# Patient Record
Sex: Female | Born: 1970 | Race: White | Hispanic: No | Marital: Married | Smoking: Former smoker
Health system: Southern US, Community
[De-identification: ages and names within clinical notes are randomized; demographics above are authoritative.]

## PROBLEM LIST (undated history)

## (undated) DIAGNOSIS — K645 Perianal venous thrombosis: Secondary | ICD-10-CM

## (undated) DIAGNOSIS — Z87442 Personal history of urinary calculi: Secondary | ICD-10-CM

## (undated) DIAGNOSIS — K602 Anal fissure, unspecified: Secondary | ICD-10-CM

## (undated) DIAGNOSIS — Z973 Presence of spectacles and contact lenses: Secondary | ICD-10-CM

## (undated) DIAGNOSIS — E669 Obesity, unspecified: Secondary | ICD-10-CM

## (undated) DIAGNOSIS — B009 Herpesviral infection, unspecified: Secondary | ICD-10-CM

## (undated) DIAGNOSIS — S060X9A Concussion with loss of consciousness of unspecified duration, initial encounter: Secondary | ICD-10-CM

## (undated) DIAGNOSIS — R5383 Other fatigue: Secondary | ICD-10-CM

## (undated) DIAGNOSIS — S060XAA Concussion with loss of consciousness status unknown, initial encounter: Secondary | ICD-10-CM

## (undated) HISTORY — PX: ABDOMINAL HYSTERECTOMY: SHX81

## (undated) HISTORY — PX: KIDNEY STONE SURGERY: SHX686

## (undated) HISTORY — DX: Perianal venous thrombosis: K64.5

## (undated) HISTORY — DX: Obesity, unspecified: E66.9

## (undated) HISTORY — PX: MULTIPLE TOOTH EXTRACTIONS: SHX2053

## (undated) HISTORY — PX: DILATION AND CURETTAGE OF UTERUS: SHX78

## (undated) HISTORY — DX: Anal fissure, unspecified: K60.2

## (undated) HISTORY — DX: Other fatigue: R53.83

---

## 2002-04-07 ENCOUNTER — Emergency Department (HOSPITAL_COMMUNITY): Admission: EM | Admit: 2002-04-07 | Discharge: 2002-04-08 | Payer: Self-pay | Admitting: Emergency Medicine

## 2002-12-07 ENCOUNTER — Emergency Department (HOSPITAL_COMMUNITY): Admission: EM | Admit: 2002-12-07 | Discharge: 2002-12-07 | Payer: Self-pay | Admitting: Emergency Medicine

## 2005-11-26 ENCOUNTER — Other Ambulatory Visit: Admission: RE | Admit: 2005-11-26 | Discharge: 2005-11-26 | Payer: Self-pay | Admitting: Obstetrics and Gynecology

## 2005-12-03 ENCOUNTER — Emergency Department (HOSPITAL_COMMUNITY): Admission: EM | Admit: 2005-12-03 | Discharge: 2005-12-03 | Payer: Self-pay | Admitting: Emergency Medicine

## 2006-03-24 ENCOUNTER — Emergency Department (HOSPITAL_COMMUNITY): Admission: EM | Admit: 2006-03-24 | Discharge: 2006-03-24 | Payer: Self-pay | Admitting: Emergency Medicine

## 2007-01-23 ENCOUNTER — Emergency Department (HOSPITAL_COMMUNITY): Admission: EM | Admit: 2007-01-23 | Discharge: 2007-01-24 | Payer: Self-pay | Admitting: Emergency Medicine

## 2007-07-24 ENCOUNTER — Ambulatory Visit (HOSPITAL_COMMUNITY): Admission: RE | Admit: 2007-07-24 | Discharge: 2007-07-24 | Payer: Self-pay | Admitting: Urology

## 2009-08-08 ENCOUNTER — Encounter: Admission: RE | Admit: 2009-08-08 | Discharge: 2009-08-08 | Payer: Self-pay | Admitting: Obstetrics and Gynecology

## 2011-01-13 ENCOUNTER — Encounter: Payer: Self-pay | Admitting: Cardiology

## 2011-01-14 ENCOUNTER — Encounter: Payer: Self-pay | Admitting: Obstetrics and Gynecology

## 2011-05-07 NOTE — Op Note (Signed)
NAMEKIMBERLIN, SCHEEL              ACCOUNT NO.:  [id number]   MEDICAL RECORD NO.:  [id number]          PATIENT TYPE:  AMB   LOCATION:  DAY                          FACILITY:  [facility]   PHYSICIAN:  Mark C. Vernie Ammons, M.D.  DATE OF BIRTH:  May 11, 1971   DATE OF PROCEDURE:  07/24/2007  DATE OF DISCHARGE:                               OPERATIVE REPORT   PREOPERATIVE DIAGNOSIS:  Right ureteral calculi.   POSTOPERATIVE DIAGNOSIS:  Right ureteral calculi.   PROCEDURE:  Cystoscopy, right retrograde pyelogram with interpretation,  right ureteroscopy with stone extraction and double-J stent placement.   SURGEON:  Mark C. Vernie Ammons, M.D.   ANESTHESIA:  General.   SPECIMENS:  Stone given to the patient.   BLOOD LOSS:  Minimal.   DRAINS:  A 5-French 24-cm Polaris stent in the right ureter with string.   COMPLICATIONS:  None.   INDICATIONS:  The patient is a 40 year old white female with  intermittent right flank pain.  Her CT scan revealed a stone in what  appeared to be distal ureter; this was followed conservatively and did  not progress.  She also appeared to have a calcification in the upper  ureteral region that was felt to possibly be a second stone; this also  failed to progress, so she is brought to the OR today for ureteroscopic  extraction of the stones.  The risks, complications and alternatives  were discussed; the patient understands and elects to proceed.   DESCRIPTION OF OPERATION:  After informed consent, the patient was  brought to the major OR and placed on the table and administered general  anesthesia then.  She was then moved to the dorsal lithotomy position  and her genitalia were sterilely prepped and draped.  Initially, a 6-  French rigid ureteroscope was passed per urethra into the bladder.  The  bladder was inspected and noted be free of any tumors or stones, or  inflammatory lesions.  The right orifice was identified and the  ureteroscope was advanced in the right  orifice, gently and easily passed  into the orifice and distal ureteral region.   A right retrograde pyelogram was then performed using full-strength  iodinated contrast through the ureteroscope.  Under direct fluoroscopy,  the contrast was injected and a filling defect was noted in the distal  ureter, consistent with the distal stone.  I therefore passed a 0.03-  inch floppy-tip guidewire through the ureteroscope and up into the renal  pelvis under direct fluoroscopic control, left that in position and then  removed the ureteroscope.   The cystoscope was back-loaded over the guidewire and the 4-cm length 14-  French ureteral dilating balloon was then passed over the guidewire and  placed in the area near the intramural ureter and inflated to 12  atmospheres for approximately 60 seconds; it was then deflated and  removed.  The guidewire was left in place and the cystoscope removed as  well.   The ureteroscope was reinserted and passed beside the guidewire up the  ureter and the distal ureteral stone was then identified, grasped with a  nitinol basket and extracted  without difficulty.  I then reinserted the  rigid ureteroscope and passed this on up the ureter and noted no further  stones in the ureter.  Since there appeared to be an upper ureteral  stone, I felt further investigation was indicated and therefore removed  the rigid ureteroscope and over the guidewire, passed the 6-French  flexible ureteroscope; the guidewire was then removed, once the scope  was in the area of the renal pelvis under fluoroscopy.  I then inspected  each calix systematically.  I was able to identify on the floor of the  renal pelvis; this was grasped with nitinol basket and extracted along  with the ureteroscope.   The 22-French cystoscope was then inserted back into the bladder and a  0.038-inche floppy-tip guidewire was then passed into the right ureteral  orifice and up the right ureter under direct  fluoroscopy.  The Polaris  stent was then passed over the guidewire and the guidewire was removed  with good curl being noted in the renal pelvis.  I then removed the  guidewire and then drained the bladder.  The cystoscope was removed and  the string affixed to the distal aspect of the stent was then attached  to the inner thigh and the patient was awakened and taken to the  recovery room in stable satisfactory condition.  She tolerated the  procedure well.  There were no intraoperative complications.  She will  follow up with me in the office in 5 days for stent removal.  In the  meantime, I will give her a prescription for Vicodin HP, #28, and  Pyridium Plus, #30.      Mark C. Vernie Ammons, M.D.  Electronically Signed     MCO/MEDQ  D:  07/24/2007  T:  07/25/2007  Job:  [id number]

## 2011-08-07 ENCOUNTER — Ambulatory Visit (INDEPENDENT_AMBULATORY_CARE_PROVIDER_SITE_OTHER): Payer: BC Managed Care – PPO | Admitting: Surgery

## 2011-08-07 ENCOUNTER — Encounter (INDEPENDENT_AMBULATORY_CARE_PROVIDER_SITE_OTHER): Payer: Self-pay | Admitting: Surgery

## 2011-08-07 DIAGNOSIS — K645 Perianal venous thrombosis: Secondary | ICD-10-CM

## 2011-08-07 DIAGNOSIS — K602 Anal fissure, unspecified: Secondary | ICD-10-CM | POA: Insufficient documentation

## 2011-08-07 DIAGNOSIS — K648 Other hemorrhoids: Secondary | ICD-10-CM | POA: Insufficient documentation

## 2011-08-07 HISTORY — DX: Perianal venous thrombosis: K64.5

## 2011-08-07 MED ORDER — HYDROCORTISONE ACETATE 25 MG RE SUPP: 25.0000 mg | Freq: Two times a day (BID) | RECTAL | Status: AC

## 2011-08-07 NOTE — Patient Instructions (Signed)
Use external cream for 3 weeks.  Use suppositories for 10 days.

## 2011-08-07 NOTE — Progress Notes (Signed)
Visit Diagnoses: 1. External hemorrhoid, thrombosed   2. Anal fissure   3. Internal hemorrhoids     HISTORY: Patient is a 40 year old female who has previously been evaluated in our practice for anal rectal problems. She was last seen here 2 years ago. Over the past few weeks she has experienced discomfort and swelling. She has not seen any bleeding. Symptoms began after an episode of heavy lifting. Patient presents today for evaluation.   PERTINENT REVIEW OF SYSTEMS: No bleeding. Moderate swelling. Moderate discomfort. No drainage.   EXAM: HEENT: normocephalic; pupils equal and reactive; sclerae clear; dentition good; mucous membranes moist NECK:  No thyroid nodules; symmetric on extension; no palpable anterior or posterior cervical lymphadenopathy; no supraclavicular masses; no tenderness CHEST: clear to auscultation bilaterally without rales, rhonchi, or wheezes CARDIAC: regular rate and rhythm without significant murmur; peripheral pulses are full EXT:  non-tender without edema; no deformity NEURO: no gross focal deficits; no sign of tremor Anorectal:  The multiple external hemorrhoidal skin tags circumferentially. Moderate soft tissue swelling left anal verge. On palpation there is a thrombosed external hemorrhoid measuring less than 1 cm in size. It is mildly tender. With eversion   of the anoderm there is a superficial fissure in the posterior midline area and with digital exam there is moderate edema of the anal canal. There are no palpable masses. There is no sign of bleeding.  IMPRESSION: #1-small thrombosed external hemorrhoid, minimally symptomatic #2-superficial posterior anal fissure #3-grade 1 internal hemorrhoids   PLAN: Patient is provided with written literature regarding anorectal problems. I am starting her on Cardizem cream for the next 3 weeks for management of the superficial anal fissure. I think this will heal with medical management. I have also given her a  prescription for Anusol-HC suppositories to use for the next 10 days. This should improve her internal hemorrhoids. The thrombosed external hemorrhoid is small and superficial and minimally symptomatic and should resolve spontaneously.  Patient will return in 3 weeks to assess her progress. I think it is unlikely that she will require any surgical intervention.   Velora Heckler, MD, FACS General & Endocrine Surgery [facility]

## 2011-09-04 ENCOUNTER — Encounter (INDEPENDENT_AMBULATORY_CARE_PROVIDER_SITE_OTHER): Payer: BC Managed Care – PPO | Admitting: Surgery

## 2011-09-19 ENCOUNTER — Encounter (INDEPENDENT_AMBULATORY_CARE_PROVIDER_SITE_OTHER): Payer: Self-pay | Admitting: Surgery

## 2011-10-07 LAB — HEMOGLOBIN AND HEMATOCRIT, BLOOD
HCT: 36.6
Hemoglobin: 12.5

## 2011-10-07 LAB — PREGNANCY, URINE: Preg Test, Ur: NEGATIVE

## 2011-11-05 ENCOUNTER — Other Ambulatory Visit: Payer: Self-pay | Admitting: Obstetrics and Gynecology

## 2011-11-05 DIAGNOSIS — N6313 Unspecified lump in the right breast, lower outer quadrant: Secondary | ICD-10-CM

## 2011-11-19 ENCOUNTER — Ambulatory Visit
Admission: RE | Admit: 2011-11-19 | Discharge: 2011-11-19 | Disposition: A | Discharge status: Home / Self Care | Payer: BC Managed Care – PPO | Source: Ambulatory Visit | Attending: Obstetrics and Gynecology | Admitting: Obstetrics and Gynecology

## 2011-11-19 DIAGNOSIS — N6313 Unspecified lump in the right breast, lower outer quadrant: Secondary | ICD-10-CM

## 2013-07-29 ENCOUNTER — Ambulatory Visit (INDEPENDENT_AMBULATORY_CARE_PROVIDER_SITE_OTHER): Payer: BC Managed Care – PPO | Admitting: General Surgery

## 2013-08-09 ENCOUNTER — Encounter (INDEPENDENT_AMBULATORY_CARE_PROVIDER_SITE_OTHER): Payer: Self-pay | Admitting: Surgery

## 2013-08-09 ENCOUNTER — Ambulatory Visit (INDEPENDENT_AMBULATORY_CARE_PROVIDER_SITE_OTHER): Payer: PRIVATE HEALTH INSURANCE | Admitting: Surgery

## 2013-08-09 VITALS — BP 132/76 | HR 68 | Resp 14 | Ht 64.0 in | Wt 236.6 lb

## 2013-08-09 DIAGNOSIS — K648 Other hemorrhoids: Secondary | ICD-10-CM

## 2013-08-09 DIAGNOSIS — K602 Anal fissure, unspecified: Secondary | ICD-10-CM

## 2013-08-09 MED ORDER — AMBULATORY NON FORMULARY MEDICATION: 1.0000 "application " | Freq: Four times a day (QID) | Status: DC

## 2013-08-09 NOTE — Progress Notes (Signed)
Subjective:     Patient ID: Ann Watson, female   DOB: May 08, 1971, 42 y.o.   MRN: [id number]  HPI  REIKA CALLANAN  10-25-1971 [id number]  Patient Care Team: Catha Gosselin, MD as PCP - General (Family Medicine)  This patient is a 42 y.o.female who presents today for surgical evaluation at the request of self.   Reason for visit: Anal pain, probable hemorrhoids  Pleasant woman that is struggled with rectal pain for many years.  She is seen her group numerous times.  She has been offered surgery for hemorrhoids such had declined with flares proving.  She was seen two years ago with a fissure and hemorrhoids.  She notes the diltiazem cream help that out.  Her hemorrhoids that have been under pretty good control until two months ago when she felt rather sharp pain.  She is not having any major bleeding.  She occasionally notes some prolapsing and bulging.  It is hard to keep the region clear.  Usually has one bowel movement a day.  She tries to have vegetables and her diet but does not take a fiber supplement.  She recalls getting banding once 15 years ago but no other interventions.  Her mother and maternal grandfather both had to have hemorrhoid surgery in their 30s.  She has never had a colonoscopy.  No personal nor family history of GI/colon cancer, inflammatory bowel disease, irritable bowel syndrome, allergy such as Celiac Sprue, dietary/dairy problems, colitis, ulcers nor gastritis.  No recent sick contacts/gastroenteritis.  No travel outside the country.  No changes in diet.    Patient Active Problem List   Diagnosis Date Noted  . Anal fissure 08/07/2011  . Internal hemorrhoids with prolapse/pain 08/07/2011    Past Medical History  Diagnosis Date  . Chronic kidney disease     kidney stone extraction  . Fatigue   . Hemorrhoids   . External hemorrhoid, thrombosed 08/07/2011    Past Surgical History  Procedure Laterality Date  . Kidney stone surgery      History   Social  History  . Marital Status: Married    Spouse Name: N/A    Number of Children: N/A  . Years of Education: N/A   Occupational History  . Not on file.   Social History Main Topics  . Smoking status: Former Smoker    Quit date: 08/10/2007  . Smokeless tobacco: Never Used  . Alcohol Use: Yes     Comment: wine - weekly  . Drug Use: No  . Sexual Activity: Not on file   Other Topics Concern  . Not on file   Social History Narrative  . No narrative on file    Family History  Problem Relation Age of Onset  . Diabetes Mother     Current Outpatient Prescriptions  Medication Sig Dispense Refill  . AMBULATORY NON FORMULARY MEDICATION Place 1 application rectally 4 (four) times daily. Diltiazem 2% compounded suspension.  1 Tube  2   No current facility-administered medications for this visit.     No Known Allergies  BP 132/76  Pulse 68  Resp 14  Ht 5\' 4"  (1.626 m)  Wt 236 lb 9.6 oz (107.321 kg)  BMI 40.59 kg/m2  No results found.   Review of Systems  Constitutional: Negative for fever, chills, diaphoresis, appetite change and fatigue.  HENT: Negative for ear pain, sore throat, trouble swallowing, neck pain and ear discharge.   Eyes: Negative for photophobia, discharge and visual disturbance.  Respiratory: Negative  for cough, choking, chest tightness and shortness of breath.   Cardiovascular: Negative for chest pain and palpitations.  Gastrointestinal: Positive for rectal pain. Negative for nausea, vomiting, abdominal pain, diarrhea, constipation, blood in stool, abdominal distention and anal bleeding.  Endocrine: Negative for cold intolerance and heat intolerance.  Genitourinary: Negative for dysuria, frequency and difficulty urinating.  Musculoskeletal: Negative for myalgias and gait problem.  Skin: Negative for color change, pallor and rash.  Allergic/Immunologic: Negative for environmental allergies, food allergies and immunocompromised state.  Neurological: Negative  for dizziness, speech difficulty, weakness and numbness.  Hematological: Negative for adenopathy.  Psychiatric/Behavioral: Negative for confusion and agitation. The patient is not nervous/anxious.        Objective:   Physical Exam  Constitutional: She is oriented to person, place, and time. She appears well-developed and well-nourished. No distress.  HENT:  Head: Normocephalic.  Mouth/Throat: Oropharynx is clear and moist. No oropharyngeal exudate.  Eyes: Conjunctivae and EOM are normal. Pupils are equal, round, and reactive to light. No scleral icterus.  Neck: Normal range of motion. Neck supple. No tracheal deviation present.  Cardiovascular: Normal rate, regular rhythm and intact distal pulses.   Pulmonary/Chest: Effort normal and breath sounds normal. No stridor. No respiratory distress. She exhibits no tenderness.  Abdominal: Soft. She exhibits no distension and no mass. There is no tenderness. Hernia confirmed negative in the right inguinal area and confirmed negative in the left inguinal area.  Genitourinary: No vaginal discharge found.  Exam done with assistance of female Medical Assistant in the room.  Perianal skin clean with good hygiene.  No pruritis.  Mildly stretched/redundant anoderm but no distinct skin tags.   No pilonidal disease.  No abscess/fistula.    Small but sensitive posterior midline anal fissure.  Swollen internal hemorrhoids.  Mildly prolapsing with Valsalva.  Right anterior equals left lateral greater than right posterior.  Tolerates digital anoscopic rectal exam.  Slightly increased sphincter tone.  No rectal masses.     Musculoskeletal: Normal range of motion. She exhibits no tenderness.       Right elbow: She exhibits normal range of motion.       Left elbow: She exhibits normal range of motion.       Right wrist: She exhibits normal range of motion.       Left wrist: She exhibits normal range of motion.       Right hand: Normal strength noted.       Left  hand: Normal strength noted.  Lymphadenopathy:       Head (right side): No posterior auricular adenopathy present.       Head (left side): No posterior auricular adenopathy present.    She has no cervical adenopathy.    She has no axillary adenopathy.       Right: No inguinal adenopathy present.       Left: No inguinal adenopathy present.  Neurological: She is alert and oriented to person, place, and time. No cranial nerve deficit. She exhibits normal muscle tone. Coordination normal.  Skin: Skin is warm and dry. No rash noted. She is not diaphoretic. No erythema.  Psychiatric: She has a normal mood and affect. Her behavior is normal. Judgment and thought content normal.       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  Enlarged internal hemorrhoids.     Plan:     We will try diltiazem cream first.  If not improved, examination under anesthesia and lateral  internal sphincterotomy to treat fissure:  The anatomy & physiology of the anorectal region was discussed.  The pathophysiology of anal fissure and differential diagnosis was discussed.  Natural history progression  was discussed.   I stressed the importance of a bowel regimen to have daily soft bowel movements to minimize progression of disease.   I discussed the use of warm soaks &  muscle relaxant, diltiazem, to help the anal sphincter relax, allow the spasming to stop, and help the tear/fissure to heal.  If non-operative treatment does not heal the fissure, I would recommend examination under anesthesia for better examination to confirm the diagnosis and treat by lateral internal sphincterotomy to allow the fissure to heal.  Technique, benefits, alternatives discussed.  Risks such as bleeding, pain, incontinence, recurrence, heart attack, death, and other risks were discussed.    Educational handouts further explaining the pathology, treatment options, and bowel regimen were given as well.  The  patient expressed understanding & will follow up PRN.  If the pain does not resolve in a few weeks or worsens, she should proceed with surgery.  I strongly recommend a bowel regimen to help the hemorrhoids come down.  I suspect she will need at least banding if not possible THD.  However, until the fissure heals, I would table with a hemorrhoid evaluation and work on bowel regimen first:  The anatomy & physiology of the anorectal region was discussed.  The pathophysiology of hemorrhoids and differential diagnosis was discussed.  Natural history progression  was discussed.   I stressed the importance of a bowel regimen to have daily soft bowel movements to minimize progression of disease.   Goal of one BM / day ideal.  Use of wet wipes, warm baths, avoiding straining, etc were emphasized.  Educational handouts further explaining the pathology, treatment options, and bowel regimen were given as well.   The patient expressed understanding.

## 2013-08-09 NOTE — Patient Instructions (Addendum)
Anal Fissure, Adult An anal fissure is a small tear or crack in the skin around the anus. Bleeding from a fissure usually stops on its own within a few minutes. However, bleeding will often reoccur with each bowel movement until the crack heals.  CAUSES   Passing large, hard stools.  Frequent diarrheal stools.  Constipation.  Inflammatory bowel disease (Crohn's disease or ulcerative colitis).  Infections.  Anal sex. SYMPTOMS   Small amounts of blood seen on your stools, on toilet paper, or in the toilet after a bowel movement.  Rectal bleeding.  Painful bowel movements.  Itching or irritation around the anus. DIAGNOSIS Your caregiver will examine the anal area. An anal fissure can usually be seen with careful inspection. A rectal exam may be performed and a short tube (anoscope) may be used to examine the anal canal. TREATMENT   You may be instructed to take fiber supplements. These supplements can soften your stool to help make bowel movements easier.  Sitz baths may be recommended to help heal the tear. Do not use soap in the sitz baths.  A medicated cream or ointment may be prescribed to lessen discomfort. HOME CARE INSTRUCTIONS   Maintain a diet high in fruits, whole grains, and vegetables. Avoid constipating foods like bananas and dairy products.  Take sitz baths as directed by your caregiver.  Drink enough fluids to keep your urine clear or pale yellow.  Only take over-the-counter or prescription medicines for pain, discomfort, or fever as directed by your caregiver. Do not take aspirin as this may increase bleeding.  Do not use ointments containing numbing medications (anesthetics) or hydrocortisone. They could slow healing.  Use of diltiazem muscle relaxant cream to help fissure heal.  This works 75% of the time.  If it does not work, you would benefit from surgery. SEEK MEDICAL CARE IF:   Your fissure is not completely healed within 3 days.  You have  further bleeding.  You have a fever.  You have diarrhea mixed with blood.  You have pain.  Your problem is getting worse rather than better. MAKE SURE YOU:   Understand these instructions.  Will watch your condition.  Will get help right away if you are not doing well or get worse. Document Released: 12/09/2005 Document Revised: 03/02/2012 Document Reviewed: 05/26/2011 [facility] Patient Information 2014 [place], [place].   HEMORRHOIDS  The rectum is the last foot of your colon, and it naturally stretches to hold stool.  Hemorrhoidal piles are natural clusters of blood vessels that help the rectum and anal canal stretch to hold stool and allow bowel movements to eliminate feces.   Hemorrhoids are abnormally swollen blood vessels in the rectum.  Too much pressure in the rectum causes hemorrhoids by forcing blood to stretch and bulge the walls of the veins, sometimes even rupturing them.  Hemorrhoids can become like varicose veins you might see on a person's legs.  Most people will develop a flare of hemorrhoids in their lifetime.  When bulging hemorrhoidal veins are irritated, they can swell, burn, itch, cause pain, and bleed.  Most flares will calm down gradually own within a few weeks.  However, once hemorrhoids are created, they are difficult to get rid of completely and tend to flare more easily than the first flare.   Fortunately, good habits and simple medical treatment usually control hemorrhoids well, and surgery is needed only in severe cases. Types of Hemorrhoids:  Internal hemorrhoids usually don't initially hurt or itch; they are deep inside  the rectum and usually have no sensation. If they begin to push out (prolapse), pain and burning can occur.  However, internal hemorrhoids can bleed.  Anal bleeding should not be ignored since bleeding could come from a dangerous source like colorectal cancer, so persistent rectal bleeding should be investigated by a doctor, sometimes with a  colonoscopy.  External hemorrhoids cause most of the symptoms - pain, burning, and itching. Nonirritated hemorrhoids can look like small skin tags coming out of the anus.   Thrombosed hemorrhoids can form when a hemorrhoid blood vessel bursts and causes the hemorrhoid to suddenly swell.  A purple blood clot can form in it and become an excruciatingly painful lump at the anus. Because of these unpleasant symptoms, immediate incision and drainage by a surgeon at an office visit can provide much relief of the pain.    PREVENTION Avoiding the most frequent causes listed below will prevent most cases of hemorrhoids: Constipation Hard stools Diarrhea  Constant sitting  Straining with bowel movements Sitting on the toilet for a long time  Severe coughing  episodes Pregnancy / Childbirth  Heavy Lifting  Sometimes avoiding the above triggers is difficult:  How can you avoid sitting all day if you have a seated job? Also, we try to avoid coughing and diarrhea, but sometimes it's beyond your control.  Still, there are some practical hints to help: Keep the anal and genital area clean.  Moistened tissues such as flushable wet wipes are less irritating than toilet paper.  Using irrigating showers or bottle irrigation washing gently cleans this sensitive area.   Avoid dry toilet paper when cleaning after bowel movements.  Marland Kitchen Keep the anal and genital area dry.  Lightly pat the rectal area dry.  Avoid rubbing.  Talcum or baby powders can help GET YOUR STOOLS SOFT.   This is the most important way to prevent irritated hemorrhoids.  Hard stools are like sandpaper to the anorectal canal and will cause more problems.  The goal: ONE SOFT BOWEL MOVEMENT A DAY!  BMs from every other day to 3 times a day is a tolerable range Treat coughing, diarrhea and constipation early since irritated hemorrhoids may soon follow.  If your main job activity is seated, always stand or walk during your breaks. Make it a point to stand  and walk at least 5 minutes every hour and try to shift frequently in your chair to avoid direct rectal pressure.  Always exhale as you strain or lift. Don't hold your breath.  Do not delay or try to prevent a bowel movement when the urge is present. Exercise regularly (walking or jogging 60 minutes a day) to stimulate the bowels to move. No reading or other activity while on the toilet. If bowel movements take longer than 5 minutes, you are too constipated. AVOID CONSTIPATION Drink plenty of liquids (1 1/2 to 2 quarts of water and other fluids a day unless fluid restricted for another medical condition). Liquids that contain caffeine (coffee a, tea, soft drinks) can be dehydrating and should be avoided until constipation is controlled. Consider minimizing milk, as dairy products may be constipating. Eat plenty of fiber (30g a day ideal, more if needed).  Fiber is the undigested part of plant food that passes into the colon, acting as "natures broom" to encourage bowel motility and movement.  Fiber can absorb and hold large amounts of water. This results in a larger, bulkier stool, which is soft and easier to pass.  Eating foods high in  fiber - 12 servings - such as  Vegetables: Root (potatoes, carrots, turnips), Leafy green (lettuce, salad greens, celery, spinach), High residue (cabbage, broccoli, etc.) Fruit: Fresh, Dried (prunes, apricots, cherries), Stewed (applesauce)  Whole grain breads, pasta, whole wheat Bran cereals, muffins, etc. Consider adding supplemental bulking fiber which retains large volumes of water: Psyllium ground seeds --available as Metamucil, Konsyl, Effersyllium, Per Diem Fiber, or the less expensive generic forms.  Citrucel  (methylcellulose wood fiber) . FiberCon (Polycarbophil) Polyethylene Glycol - and "artificial" fiber commonly called Miralax or Glycolax.  It is helpful for people with gassy or bloated feelings with regular fiber Flax Seed - a less gassy natural fiber   Laxatives can be useful for a short period if constipation is severe Osmotics (Milk of Magnesia, Fleets Phospho-Soda, Magnesium Citrate)  Stimulants (Senokot,   Castor Oil,  Dulcolax, Ex-Lax)    Laxatives are not a good long-term solution as it can stress the bowels and cause too much mineral loss and dehydration.   Avoid taking laxatives for more than 7 days in a row.  AVOID DIARRHEA Switch to liquids and simpler foods for a few days to avoid stressing your intestines further. Avoid dairy products (especially milk & ice cream) for a short time.  The intestines often can lose the ability to digest lactose when stressed. Avoid foods that cause gassiness or bloating.  Typical foods include beans and other legumes, cabbage, broccoli, and dairy foods.  Every person has some sensitivity to other foods, so listen to your body and avoid those foods that trigger problems for you. Adding fiber (Citrucel, Metamucil, FiberCon, Flax seed, Miralax) gradually can help thicken stools by absorbing excess fluid and retrain the intestines to act more normally.  Slowly increase the dose over a few weeks.  Too much fiber too soon can backfire and cause cramping & bloating. Probiotics (such as active yogurt, Align, etc) may help repopulate the intestines and colon with normal bacteria and calm down a sensitive digestive tract.  Most studies show it to be of mild help, though, and such products can be costly. Medicines: Bismuth subsalicylate (ex. Kayopectate, Pepto Bismol) every 30 minutes for up to 6 doses can help control diarrhea.  Avoid if pregnant. Loperamide (Immodium) can slow down diarrhea.  Start with two tablets (4mg  total) first and then try one tablet every 6 hours.  Avoid if you are having fevers or severe pain.  If you are not better or start feeling worse, stop all medicines and call your doctor for advice Call your doctor if you are getting worse or not better.  Sometimes further testing (cultures,  endoscopy, X-ray studies, bloodwork, etc) may be needed to help diagnose and treat the cause of the diarrhea. TREATMENT OF HEMORRHOID FLARE If these preventive measures fail, you must take action right away! Hemorrhoids are one condition that can be mild in the morning and become intolerable by nightfall. Most hemorrhoidal flares take several weeks to calm down.  These suggestions can help: Warm soaks.  This helps more than any topical medication.  Use up to 8 times a day.  Usually sitz baths or sitting in a warm bathtub helps.  Sitting on moist warm towels are helpful.  Switching to ice packs/cool compresses can be helpful Normalize your bowels.  Extremes of diarrhea or constipation will make hemorrhoids worse.  One soft bowel movement a day is the goal.  Fiber can help get your bowels regular Wet wipes instead of toilet paper Pain control with a NSAID  such as ibuprofen (Advil) or naproxen (Aleve) or acetaminophen (Tylenol) around the clock.  Narcotics are constipating and should be minimized if possible Topical creams contain steroids (bydrocortisone) or local anesthetic (xylocaine) can help make pain and itching more tolerable.   EVALUATION If hemorrhoids are still causing problems, you could benefit by an evaluation by a surgeon.  The surgeon will obtain a history and examine you.  If hemorrhoids are diagnosed, some therapies can be offered in the office, usually with an anoscope into the less sensitive area of the rectum: -injection of hemorrhoids (sclerotherapy) can scar the blood vessels of the swollen/enlarged hemorrhoids to help shrink them down to a more normal size -rubber banding of the enlarged hemorrhoids to help shrink them down to a more normal size -drainage of the blood clot causing a thrombosed hemorrhoid,  to relieve the severe pain   While 90% of the time such problems from hemorrhoids can be managed without preceding to surgery, sometimes the hemorrhoids require a operation to  control the problem (uncontrolled bleeding, prolapse, pain, etc.).   This involves being placed under general anesthesia where the surgeon can confirm the diagnosis and remove, suture, or staple the hemorrhoid(s).  Your surgeon can help you treat the problem appropriately.

## 2013-09-07 ENCOUNTER — Ambulatory Visit (INDEPENDENT_AMBULATORY_CARE_PROVIDER_SITE_OTHER): Payer: PRIVATE HEALTH INSURANCE | Admitting: Surgery

## 2013-09-07 ENCOUNTER — Encounter (INDEPENDENT_AMBULATORY_CARE_PROVIDER_SITE_OTHER): Payer: Self-pay | Admitting: Surgery

## 2013-09-07 VITALS — BP 128/82 | HR 62 | Resp 18 | Ht 64.0 in | Wt 232.0 lb

## 2013-09-07 DIAGNOSIS — K648 Other hemorrhoids: Secondary | ICD-10-CM

## 2013-09-07 DIAGNOSIS — K602 Anal fissure, unspecified: Secondary | ICD-10-CM

## 2013-09-07 NOTE — Progress Notes (Signed)
Subjective:     Patient ID: Ann Watson, female   DOB: 1971/05/25, 42 y.o.   MRN: [id number]  HPI   [name]  02-09-1971 [id number]  Patient Care Team: Catha Gosselin, MD as PCP - General (Family Medicine)  This patient is a 42 y.o.female who presents today for surgical evaluation at the request of self.   Reason for visit: Anal pain, probable hemorrhoids  Pleasant woman that is struggled with rectal pain for many years.  She is seen her group numerous times.  She has been offered surgery for hemorrhoids such had declined with flares proving.  She was seen two years ago with a fissure and hemorrhoids.  She notes the diltiazem cream help that out.   She had another flare of pain again.  I diagnosed a fissure on top of some inflamed hemorrhoids.  We talked about trying to find a bowel regimen she could be compliant with.  I recommended to her thighs and muscle relaxant cream.  Since then, she found something that worked for her.  Diltiazem cream and bowel regimen or helping the pain go down.  In a much better place.  She is happy how she is improving  Patient Active Problem List   Diagnosis Date Noted  . Anal fissure 08/07/2011  . Internal hemorrhoids with prolapse/pain 08/07/2011    Past Medical History  Diagnosis Date  . Chronic kidney disease     kidney stone extraction  . Fatigue   . Hemorrhoids   . External hemorrhoid, thrombosed 08/07/2011  . Obese     Past Surgical History  Procedure Laterality Date  . Kidney stone surgery      History   Social History  . Marital Status: Married    Spouse Name: N/A    Number of Children: N/A  . Years of Education: N/A   Occupational History  . Not on file.   Social History Main Topics  . Smoking status: Former Smoker    Quit date: 08/10/2007  . Smokeless tobacco: Never Used  . Alcohol Use: Yes     Comment: wine - weekly  . Drug Use: No  . Sexual Activity: Not on file   Other Topics Concern  . Not on file    Social History Narrative  . No narrative on file    Family History  Problem Relation Age of Onset  . Diabetes Mother     Current Outpatient Prescriptions  Medication Sig Dispense Refill  . AMBULATORY NON FORMULARY MEDICATION Place 1 application rectally 4 (four) times daily. Diltiazem 2% compounded suspension.  1 Tube  2   No current facility-administered medications for this visit.     No Known Allergies  BP 128/82  Pulse 62  Resp 18  Ht 5\' 4"  (1.626 m)  Wt 232 lb (105.235 kg)  BMI 39.8 kg/m2  No results found.   Review of Systems  Constitutional: Negative for fever, chills, diaphoresis, appetite change and fatigue.  HENT: Negative for ear pain, sore throat, trouble swallowing, neck pain and ear discharge.   Eyes: Negative for photophobia, discharge and visual disturbance.  Respiratory: Negative for cough, choking, chest tightness and shortness of breath.   Cardiovascular: Negative for chest pain and palpitations.  Gastrointestinal: Negative for nausea, vomiting, abdominal pain, diarrhea, constipation, blood in stool, abdominal distention and anal bleeding.  Endocrine: Negative for cold intolerance and heat intolerance.  Genitourinary: Negative for dysuria, frequency and difficulty urinating.  Musculoskeletal: Negative for myalgias and gait problem.  Skin:  Negative for color change, pallor and rash.  Allergic/Immunologic: Negative for environmental allergies, food allergies and immunocompromised state.  Neurological: Negative for dizziness, speech difficulty, weakness and numbness.  Hematological: Negative for adenopathy.  Psychiatric/Behavioral: Negative for confusion and agitation. The patient is not nervous/anxious.        Objective:   Physical Exam  Constitutional: She is oriented to person, place, and time. She appears well-developed and well-nourished. No distress.  HENT:  Head: Normocephalic.  Mouth/Throat: Oropharynx is clear and moist. No oropharyngeal  exudate.  Eyes: Conjunctivae and EOM are normal. Pupils are equal, round, and reactive to light. No scleral icterus.  Neck: Normal range of motion. Neck supple. No tracheal deviation present.  Cardiovascular: Normal rate, regular rhythm and intact distal pulses.   Pulmonary/Chest: Effort normal and breath sounds normal. No stridor. No respiratory distress. She exhibits no tenderness.  Abdominal: Soft. She exhibits no distension and no mass. There is no tenderness. Hernia confirmed negative in the right inguinal area and confirmed negative in the left inguinal area.  Genitourinary: No vaginal discharge found.  Exam done with assistance of female Medical Assistant in the room.  Perianal skin clean with good hygiene.  No pruritis.  Mildly stretched/redundant anoderm but no distinct skin tags.   No pilonidal disease.  No abscess/fistula.    Posterior midline anal fissure nearly closed, Small little posterior sentinel tag.  Some external skin but no prolapsing hemorrhoids today.    Musculoskeletal: Normal range of motion. She exhibits no tenderness.       Right elbow: She exhibits normal range of motion.       Left elbow: She exhibits normal range of motion.       Right wrist: She exhibits normal range of motion.       Left wrist: She exhibits normal range of motion.       Right hand: Normal strength noted.       Left hand: Normal strength noted.  Lymphadenopathy:       Head (right side): No posterior auricular adenopathy present.       Head (left side): No posterior auricular adenopathy present.    She has no cervical adenopathy.    She has no axillary adenopathy.       Right: No inguinal adenopathy present.       Left: No inguinal adenopathy present.  Neurological: She is alert and oriented to person, place, and time. No cranial nerve deficit. She exhibits normal muscle tone. Coordination normal.  Skin: Skin is warm and dry. No rash noted. She is not diaphoretic. No erythema.  Psychiatric:  She has a normal mood and affect. Her behavior is normal. Judgment and thought content normal.       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  Enlarged internal hemorrhoids.     Plan:     Continue the diltiazem cream for the fissure as it seems to be healing.  If not improved, examination under anesthesia and lateral internal sphincterotomy to treat fissure:  The anatomy & physiology of the anorectal region was discussed.  The pathophysiology of anal fissure and differential diagnosis was discussed.  Natural history progression  was discussed.   I stressed the importance of a bowel regimen to have daily soft bowel movements to minimize progression of disease.   I discussed the use of warm soaks &  muscle relaxant, diltiazem, to help the anal sphincter relax, allow the spasming to stop, and help the  tear/fissure to heal.  If non-operative treatment does not heal the fissure, I would recommend examination under anesthesia for better examination to confirm the diagnosis and treat by lateral internal sphincterotomy to allow the fissure to heal.  Technique, benefits, alternatives discussed.  Risks such as bleeding, pain, incontinence, recurrence, heart attack, death, and other risks were discussed.    Educational handouts further explaining the pathology, treatment options, and bowel regimen were given as well.  The patient expressed understanding & will follow up PRN.  If the pain does not resolve in a few weeks or worsens, she should proceed with surgery.  She is a believer in the bowel regimen to help the hemorrhoids come down.   She is improved enough that she may not need anything more aggressive such as banding or THD.  At this point she can followup when necessary as long as she continues to improve.  The anatomy & physiology of the anorectal region was discussed.  The pathophysiology of hemorrhoids and differential diagnosis was discussed.  Natural  history progression  was discussed.   I stressed the importance of a bowel regimen to have daily soft bowel movements to minimize progression of disease.   Goal of one BM / day ideal.  Use of wet wipes, warm baths, avoiding straining, etc were emphasized.  Educational handouts further explaining the pathology, treatment options, and bowel regimen were given as well.   The patient expressed understanding.

## 2013-09-07 NOTE — Patient Instructions (Signed)
HEMORRHOIDS  The rectum is the last foot of your colon, and it naturally stretches to hold stool.  Hemorrhoidal piles are natural clusters of blood vessels that help the rectum and anal canal stretch to hold stool and allow bowel movements to eliminate feces.   Hemorrhoids are abnormally swollen blood vessels in the rectum.  Too much pressure in the rectum causes hemorrhoids by forcing blood to stretch and bulge the walls of the veins, sometimes even rupturing them.  Hemorrhoids can become like varicose veins you might see on a person's legs.  Most people will develop a flare of hemorrhoids in their lifetime.  When bulging hemorrhoidal veins are irritated, they can swell, burn, itch, cause pain, and bleed.  Most flares will calm down gradually own within a few weeks.  However, once hemorrhoids are created, they are difficult to get rid of completely and tend to flare more easily than the first flare.   Fortunately, good habits and simple medical treatment usually control hemorrhoids well, and surgery is needed only in severe cases. Types of Hemorrhoids:  Internal hemorrhoids usually don't initially hurt or itch; they are deep inside the rectum and usually have no sensation. If they begin to push out (prolapse), pain and burning can occur.  However, internal hemorrhoids can bleed.  Anal bleeding should not be ignored since bleeding could come from a dangerous source like colorectal cancer, so persistent rectal bleeding should be investigated by a doctor, sometimes with a colonoscopy.  External hemorrhoids cause most of the symptoms - pain, burning, and itching. Nonirritated hemorrhoids can look like small skin tags coming out of the anus.   Thrombosed hemorrhoids can form when a hemorrhoid blood vessel bursts and causes the hemorrhoid to suddenly swell.  A purple blood clot can form in it and become an excruciatingly painful lump at the anus. Because of these unpleasant symptoms, immediate incision and  drainage by a surgeon at an office visit can provide much relief of the pain.    PREVENTION Avoiding the most frequent causes listed below will prevent most cases of hemorrhoids: Constipation Hard stools Diarrhea  Constant sitting  Straining with bowel movements Sitting on the toilet for a long time  Severe coughing  episodes Pregnancy / Childbirth  Heavy Lifting  Sometimes avoiding the above triggers is difficult:  How can you avoid sitting all day if you have a seated job? Also, we try to avoid coughing and diarrhea, but sometimes it's beyond your control.  Still, there are some practical hints to help: Keep the anal and genital area clean.  Moistened tissues such as flushable wet wipes are less irritating than toilet paper.  Using irrigating showers or bottle irrigation washing gently cleans this sensitive area.   Avoid dry toilet paper when cleaning after bowel movements.  Marland Kitchen Keep the anal and genital area dry.  Lightly pat the rectal area dry.  Avoid rubbing.  Talcum or baby powders can help GET YOUR STOOLS SOFT.   This is the most important way to prevent irritated hemorrhoids.  Hard stools are like sandpaper to the anorectal canal and will cause more problems.  The goal: ONE SOFT BOWEL MOVEMENT A DAY!  BMs from every other day to 3 times a day is a tolerable range Treat coughing, diarrhea and constipation early since irritated hemorrhoids may soon follow.  If your main job activity is seated, always stand or walk during your breaks. Make it a point to stand and walk at least 5 minutes every hour  and try to shift frequently in your chair to avoid direct rectal pressure.  Always exhale as you strain or lift. Don't hold your breath.  Do not delay or try to prevent a bowel movement when the urge is present. Exercise regularly (walking or jogging 60 minutes a day) to stimulate the bowels to move. No reading or other activity while on the toilet. If bowel movements take longer than 5 minutes,  you are too constipated. AVOID CONSTIPATION Drink plenty of liquids (1 1/2 to 2 quarts of water and other fluids a day unless fluid restricted for another medical condition). Liquids that contain caffeine (coffee a, tea, soft drinks) can be dehydrating and should be avoided until constipation is controlled. Consider minimizing milk, as dairy products may be constipating. Eat plenty of fiber (30g a day ideal, more if needed).  Fiber is the undigested part of plant food that passes into the colon, acting as "natures broom" to encourage bowel motility and movement.  Fiber can absorb and hold large amounts of water. This results in a larger, bulkier stool, which is soft and easier to pass.  Eating foods high in fiber - 12 servings - such as  Vegetables: Root (potatoes, carrots, turnips), Leafy green (lettuce, salad greens, celery, spinach), High residue (cabbage, broccoli, etc.) Fruit: Fresh, Dried (prunes, apricots, cherries), Stewed (applesauce)  Whole grain breads, pasta, whole wheat Bran cereals, muffins, etc. Consider adding supplemental bulking fiber which retains large volumes of water: Psyllium ground seeds (native plant from [place])--available as Metamucil, Konsyl, Effersyllium, Per Diem Fiber, or the less expensive generic forms.  Citrucel  (methylcellulose wood fiber) . FiberCon (Polycarbophil) Polyethylene Glycol - and "artificial" fiber commonly called Miralax or Glycolax.  It is helpful for people with gassy or bloated feelings with regular fiber Flax Seed - a less gassy natural fiber  Laxatives can be useful for a short period if constipation is severe Osmotics (Milk of Magnesia, Fleets Phospho-Soda, Magnesium Citrate)  Stimulants (Senokot,   Castor Oil,  Dulcolax, Ex-Lax)    Laxatives are not a good long-term solution as it can stress the bowels and cause too much mineral loss and dehydration.   Avoid taking laxatives for more than 7 days in a row.  AVOID DIARRHEA Switch to  liquids and simpler foods for a few days to avoid stressing your intestines further. Avoid dairy products (especially milk & ice cream) for a short time.  The intestines often can lose the ability to digest lactose when stressed. Avoid foods that cause gassiness or bloating.  Typical foods include beans and other legumes, cabbage, broccoli, and dairy foods.  Every person has some sensitivity to other foods, so listen to your body and avoid those foods that trigger problems for you. Adding fiber (Citrucel, Metamucil, FiberCon, Flax seed, Miralax) gradually can help thicken stools by absorbing excess fluid and retrain the intestines to act more normally.  Slowly increase the dose over a few weeks.  Too much fiber too soon can backfire and cause cramping & bloating. Probiotics (such as active yogurt, Align, etc) may help repopulate the intestines and colon with normal bacteria and calm down a sensitive digestive tract.  Most studies show it to be of mild help, though, and such products can be costly. Medicines: Bismuth subsalicylate (ex. Kayopectate, Pepto Bismol) every 30 minutes for up to 6 doses can help control diarrhea.  Avoid if pregnant. Loperamide (Immodium) can slow down diarrhea.  Start with two tablets (57m total) first and then try one tablet  every 6 hours.  Avoid if you are having fevers or severe pain.  If you are not better or start feeling worse, stop all medicines and call your doctor for advice Call your doctor if you are getting worse or not better.  Sometimes further testing (cultures, endoscopy, X-ray studies, bloodwork, etc) may be needed to help diagnose and treat the cause of the diarrhea. TREATMENT OF HEMORRHOID FLARE If these preventive measures fail, you must take action right away! Hemorrhoids are one condition that can be mild in the morning and become intolerable by nightfall. Most hemorrhoidal flares take several weeks to calm down.  These suggestions can help: Warm soaks.   This helps more than any topical medication.  Use up to 8 times a day.  Usually sitz baths or sitting in a warm bathtub helps.  Sitting on moist warm towels are helpful.  Switching to ice packs/cool compresses can be helpful Normalize your bowels.  Extremes of diarrhea or constipation will make hemorrhoids worse.  One soft bowel movement a day is the goal.  Fiber can help get your bowels regular Wet wipes instead of toilet paper Pain control with a NSAID such as ibuprofen (Advil) or naproxen (Aleve) or acetaminophen (Tylenol) around the clock.  Narcotics are constipating and should be minimized if possible Topical creams contain steroids (bydrocortisone) or local anesthetic (xylocaine) can help make pain and itching more tolerable.   EVALUATION If hemorrhoids are still causing problems, you could benefit by an evaluation by a surgeon.  The surgeon will obtain a history and examine you.  If hemorrhoids are diagnosed, some therapies can be offered in the office, usually with an anoscope into the less sensitive area of the rectum: -injection of hemorrhoids (sclerotherapy) can scar the blood vessels of the swollen/enlarged hemorrhoids to help shrink them down to a more normal size -rubber banding of the enlarged hemorrhoids to help shrink them down to a more normal size -drainage of the blood clot causing a thrombosed hemorrhoid,  to relieve the severe pain   While 90% of the time such problems from hemorrhoids can be managed without preceding to surgery, sometimes the hemorrhoids require a operation to control the problem (uncontrolled bleeding, prolapse, pain, etc.).   This involves being placed under general anesthesia where the surgeon can confirm the diagnosis and remove, suture, or staple the hemorrhoid(s).  Your surgeon can help you treat the problem appropriately.    Anal Fissure, Adult An anal fissure is a small tear or crack in the skin around the anus. Bleeding from a fissure usually stops on  its own within a few minutes. However, bleeding will often reoccur with each bowel movement until the crack heals.  CAUSES   Passing large, hard stools.  Frequent diarrheal stools.  Constipation.  Inflammatory bowel disease (Crohn's disease or ulcerative colitis).  Infections.  Anal sex. SYMPTOMS   Small amounts of blood seen on your stools, on toilet paper, or in the toilet after a bowel movement.  Rectal bleeding.  Painful bowel movements.  Itching or irritation around the anus. DIAGNOSIS Your caregiver will examine the anal area. An anal fissure can usually be seen with careful inspection. A rectal exam may be performed and a short tube (anoscope) may be used to examine the anal canal. TREATMENT   You may be instructed to take fiber supplements. These supplements can soften your stool to help make bowel movements easier.  Sitz baths may be recommended to help heal the tear. Do not use soap  in the sitz baths.  A medicated cream or ointment may be prescribed to lessen discomfort. HOME CARE INSTRUCTIONS   Maintain a diet high in fruits, whole grains, and vegetables. Avoid constipating foods like bananas and dairy products.  Take sitz baths as directed by your caregiver.  Drink enough fluids to keep your urine clear or pale yellow.  Only take over-the-counter or prescription medicines for pain, discomfort, or fever as directed by your caregiver. Do not take aspirin as this may increase bleeding.  Do not use ointments containing numbing medications (anesthetics) or hydrocortisone. They could slow healing. SEEK MEDICAL CARE IF:   Your fissure is not completely healed within 3 days.  You have further bleeding.  You have a fever.  You have diarrhea mixed with blood.  You have pain.  Your problem is getting worse rather than better. MAKE SURE YOU:   Understand these instructions.  Will watch your condition.  Will get help right away if you are not doing well  or get worse. Document Released: 12/09/2005 Document Revised: 03/02/2012 Document Reviewed: 05/26/2011 [facility] Patient Information 2014 [place], [place].  GETTING TO GOOD BOWEL HEALTH. Irregular bowel habits such as constipation and diarrhea can lead to many problems over time.  Having one soft bowel movement a day is the most important way to prevent further problems.  The anorectal canal is designed to handle stretching and feces to safely manage our ability to get rid of solid waste (feces, poop, stool) out of our body.  BUT, hard constipated stools can act like ripping concrete bricks and diarrhea can be a burning fire to this very sensitive area of our body, causing inflamed hemorrhoids, anal fissures, increasing risk is perirectal abscesses, abdominal pain/bloating, an making irritable bowel worse.     The goal: ONE SOFT BOWEL MOVEMENT A DAY!  To have soft, regular bowel movements:    Drink at least 8 tall glasses of water a day.     Take plenty of fiber.  Fiber is the undigested part of plant food that passes into the colon, acting s "natures broom" to encourage bowel motility and movement.  Fiber can absorb and hold large amounts of water. This results in a larger, bulkier stool, which is soft and easier to pass. Work gradually over several weeks up to 6 servings a day of fiber (25g a day even more if needed) in the form of: o Vegetables -- Root (potatoes, carrots, turnips), leafy green (lettuce, salad greens, celery, spinach), or cooked high residue (cabbage, broccoli, etc) o Fruit -- Fresh (unpeeled skin & pulp), Dried (prunes, apricots, cherries, etc ),  or stewed ( applesauce)  o Whole grain breads, pasta, etc (whole wheat)  o Bran cereals    Bulking Agents -- This type of water-retaining fiber generally is easily obtained each day by one of the following:  o Psyllium bran -- The psyllium plant is remarkable because its ground seeds can retain so much water. This product is available as  Metamucil, Konsyl, Effersyllium, Per Diem Fiber, or the less expensive generic preparation in drug and health food stores. Although labeled a laxative, it really is not a laxative.  o Methylcellulose -- This is another fiber derived from wood which also retains water. It is available as Citrucel. o Polyethylene Glycol - and "artificial" fiber commonly called Miralax or Glycolax.  It is helpful for people with gassy or bloated feelings with regular fiber o Flax Seed - a less gassy fiber than psyllium   No reading  or other relaxing activity while on the toilet. If bowel movements take longer than 5 minutes, you are too constipated   AVOID CONSTIPATION.  High fiber and water intake usually takes care of this.  Sometimes a laxative is needed to stimulate more frequent bowel movements, but    Laxatives are not a good long-term solution as it can wear the colon out. o Osmotics (Milk of Magnesia, Fleets phosphosoda, Magnesium citrate, MiraLax, GoLytely) are safer than  o Stimulants (Senokot, Castor Oil, Dulcolax, Ex Lax)    o Do not take laxatives for more than 7days in a row.    IF SEVERELY CONSTIPATED, try a Bowel Retraining Program: o Do not use laxatives.  o Eat a diet high in roughage, such as bran cereals and leafy vegetables.  o Drink six (6) ounces of prune or apricot juice each morning.  o Eat two (2) large servings of stewed fruit each day.  o Take one (1) heaping tablespoon of a psyllium-based bulking agent twice a day. Use sugar-free sweetener when possible to avoid excessive calories.  o Eat a normal breakfast.  o Set aside 15 minutes after breakfast to sit on the toilet, but do not strain to have a bowel movement.  o If you do not have a bowel movement by the third day, use an enema and repeat the above steps.    Controlling diarrhea o Switch to liquids and simpler foods for a few days to avoid stressing your intestines further. o Avoid dairy products (especially milk & ice cream) for a  short time.  The intestines often can lose the ability to digest lactose when stressed. o Avoid foods that cause gassiness or bloating.  Typical foods include beans and other legumes, cabbage, broccoli, and dairy foods.  Every person has some sensitivity to other foods, so listen to our body and avoid those foods that trigger problems for you. o Adding fiber (Citrucel, Metamucil, psyllium, Miralax) gradually can help thicken stools by absorbing excess fluid and retrain the intestines to act more normally.  Slowly increase the dose over a few weeks.  Too much fiber too soon can backfire and cause cramping & bloating. o Probiotics (such as active yogurt, Align, etc) may help repopulate the intestines and colon with normal bacteria and calm down a sensitive digestive tract.  Most studies show it to be of mild help, though, and such products can be costly. o Medicines:   Bismuth subsalicylate (ex. Kayopectate, Pepto Bismol) every 30 minutes for up to 6 doses can help control diarrhea.  Avoid if pregnant.   Loperamide (Immodium) can slow down diarrhea.  Start with two tablets (4mg  total) first and then try one tablet every 6 hours.  Avoid if you are having fevers or severe pain.  If you are not better or start feeling worse, stop all medicines and call your doctor for advice o Call your doctor if you are getting worse or not better.  Sometimes further testing (cultures, endoscopy, X-ray studies, bloodwork, etc) may be needed to help diagnose and treat the cause of the diarrhea. o

## 2013-10-19 ENCOUNTER — Encounter (INDEPENDENT_AMBULATORY_CARE_PROVIDER_SITE_OTHER): Payer: Self-pay | Admitting: Surgery

## 2013-10-19 ENCOUNTER — Ambulatory Visit (INDEPENDENT_AMBULATORY_CARE_PROVIDER_SITE_OTHER): Payer: PRIVATE HEALTH INSURANCE | Admitting: Surgery

## 2013-10-19 VITALS — BP 128/66 | HR 76 | Resp 16 | Ht 64.5 in | Wt 233.6 lb

## 2013-10-19 DIAGNOSIS — K648 Other hemorrhoids: Secondary | ICD-10-CM

## 2013-10-19 MED ORDER — HYDROCORTISONE 2.5 % RE CREA: TOPICAL_CREAM | Freq: Two times a day (BID) | RECTAL | Status: DC

## 2013-10-19 MED ORDER — NAPROXEN 500 MG PO TABS: 500.0000 mg | ORAL_TABLET | Freq: Two times a day (BID) | ORAL | Status: DC

## 2013-10-19 NOTE — Progress Notes (Signed)
Subjective:     Patient ID: Ann Watson, female   DOB: [birth year], 42 y.o.   MRN: [id number]  HPI   Ann Watson  02/28/1971 [id number]  Patient Care Team: Catha Gosselin, MD as PCP - General (Family Medicine)  This patient is a 42 y.o.female who presents today for surgical evaluation at the request of self.   Reason for visit: Anal pain, probable hemorrhoids  Pleasant woman that is struggled with rectal pain for many years.  She is seen her group numerous times.  She has been offered surgery for hemorrhoids such had declined with flares proving.  She was seen two years ago with a fissure and hemorrhoids.  She notes the diltiazem cream help that out.     It has gotten better this week but still sensitive.    She had another flare.  She sheepishly confessed to me that she went to [facility] to a holistic center.  I provided treatment of her hemorrhoids with "low voltage".  Apparently, a probe was placed in her anal canal for 15 minutes.  Became exquisitely painful.  This was two weeks ago.  She was worried that something was damaged.  No incontinence to flatus or stool.  No severe bleeding.  Used up some old hydrocodone and suppositories.  It made her more constipated.  Eventually weaned her off the narcotics.  Patient Active Problem List   Diagnosis Date Noted  . Anal fissure 08/07/2011  . Internal hemorrhoids with prolapse/pain 08/07/2011    Past Medical History  Diagnosis Date  . Chronic kidney disease     kidney stone extraction  . Fatigue   . Hemorrhoids   . External hemorrhoid, thrombosed 08/07/2011  . Obese   . Anal fissure 08/07/2011    Past Surgical History  Procedure Laterality Date  . Kidney stone surgery      History   Social History  . Marital Status: Married    Spouse Name: N/A    Number of Children: N/A  . Years of Education: N/A   Occupational History  . Not on file.   Social History Main Topics  . Smoking status: Former Smoker    Quit date:  08/10/2007  . Smokeless tobacco: Never Used  . Alcohol Use: Yes     Comment: wine - weekly  . Drug Use: No  . Sexual Activity: Not on file   Other Topics Concern  . Not on file   Social History Narrative  . No narrative on file    Family History  Problem Relation Age of Onset  . Diabetes Mother     Current Outpatient Prescriptions  Medication Sig Dispense Refill  . AMBULATORY NON FORMULARY MEDICATION Place 1 application rectally 4 (four) times daily. Diltiazem 2% compounded suspension.  1 Tube  2  . hydrocortisone (ANUSOL-HC) 2.5 % rectal cream Place rectally 2 (two) times daily. Apply around anus for irritated & painful hemorrhoids  15 g  2  . naproxen (NAPROSYN) 500 MG tablet Take 1 tablet (500 mg total) by mouth 2 (two) times daily with a meal.  40 tablet  1   No current facility-administered medications for this visit.     No Known Allergies  BP 128/66  Pulse 76  Resp 16  Ht 5' 4.5" (1.638 m)  Wt 233 lb 9.6 oz (105.96 kg)  BMI 39.49 kg/m2  No results found.   Review of Systems  Constitutional: Negative for fever, chills, diaphoresis, appetite change and fatigue.  HENT: Negative for  ear discharge, ear pain, sore throat and trouble swallowing.   Eyes: Negative for photophobia, discharge and visual disturbance.  Respiratory: Negative for cough, choking, chest tightness and shortness of breath.   Cardiovascular: Negative for chest pain and palpitations.  Gastrointestinal: Positive for anal bleeding and rectal pain. Negative for nausea, vomiting, abdominal pain, diarrhea, constipation, blood in stool and abdominal distention.  Endocrine: Negative for cold intolerance and heat intolerance.  Genitourinary: Negative for dysuria, frequency and difficulty urinating.  Musculoskeletal: Negative for gait problem, myalgias and neck pain.  Skin: Negative for color change, pallor and rash.  Allergic/Immunologic: Negative for environmental allergies, food allergies and  immunocompromised state.  Neurological: Negative for dizziness, speech difficulty, weakness and numbness.  Hematological: Negative for adenopathy.  Psychiatric/Behavioral: Negative for confusion and agitation. The patient is not nervous/anxious.        Objective:   Physical Exam  Constitutional: She is oriented to person, place, and time. She appears well-developed and well-nourished. No distress.  HENT:  Head: Normocephalic.  Mouth/Throat: Oropharynx is clear and moist. No oropharyngeal exudate.  Eyes: Conjunctivae and EOM are normal. Pupils are equal, round, and reactive to light. No scleral icterus.  Neck: Normal range of motion. Neck supple. No tracheal deviation present.  Cardiovascular: Normal rate, regular rhythm and intact distal pulses.   Pulmonary/Chest: Effort normal and breath sounds normal. No stridor. No respiratory distress. She exhibits no tenderness.  Abdominal: Soft. She exhibits no distension and no mass. There is no tenderness. Hernia confirmed negative in the right inguinal area and confirmed negative in the left inguinal area.  No hernias  Genitourinary: No vaginal discharge found.  Exam done with assistance of female Medical Assistant in the room.  Perianal skin clean with good hygiene.  No pruritis.  Mildly stretched/redundant anoderm but no distinct ext hemorrhoids   No pilonidal disease.  No abscess/fistula.    Posterior midline anal fissure healed.   Mildly increased sphincter tone.  Hemorrhoidal piles slightly rotated.  Sensitive enlarged internal hemorrhoidal piles especially left posterior lateral and right posterior lateral.  Anterior midline milder   Musculoskeletal: Normal range of motion. She exhibits no tenderness.       Right elbow: She exhibits normal range of motion.       Left elbow: She exhibits normal range of motion.       Right wrist: She exhibits normal range of motion.       Left wrist: She exhibits normal range of motion.       Right hand:  Normal strength noted.       Left hand: Normal strength noted.  Lymphadenopathy:       Head (right side): No posterior auricular adenopathy present.       Head (left side): No posterior auricular adenopathy present.    She has no cervical adenopathy.    She has no axillary adenopathy.       Right: No inguinal adenopathy present.       Left: No inguinal adenopathy present.  Neurological: She is alert and oriented to person, place, and time. No cranial nerve deficit. She exhibits normal muscle tone. Coordination normal.  Skin: Skin is warm and dry. No rash noted. She is not diaphoretic. No erythema.  Psychiatric: She has a normal mood and affect. Her behavior is normal. Judgment and thought content normal.       Assessment:     Enlarged internal hemorrhoids.     Plan:   I do not think any permanent damage was  done.  I am skeptical that the hemorrhoids were markedly improved with this alternative intervention.  She feels reassured that there is no major damage occurring   I had a long discussion of her options.  Given her recent anal treatment, we could wait a month before doing something definitive and just rely on a better bowel regimen first with wet wipes and warm soaks.  She wanted to do something more aggressive.  Therefore I recommended banding.  I did a banding all three piles.  Best purchase on the right posterior pile:  The anatomy & physiology of the anorectal region was discussed.  The pathophysiology of hemorrhoids and differential diagnosis was discussed.  Natural history progression  was discussed.   I stressed the importance of a bowel regimen to have daily soft bowel movements to minimize progression of disease.     The patient's symptoms are not adequately controlled.  Therefore, I recommended banding to treat the hemorrhoids.  I went over the technique, risks, benefits, and alternatives.   Goals of post-operative recovery were discussed as well.  Questions were answered.   The patient expressed understanding & wished to proceed.  The patient was positioned in the lateral decubitus position.  Perianal & rectal examination was done.  Using anoscopy, I ligated the rectum above the inflamed hemorrhoids above the dentate line with banding.  The patient tolerated the procedure well.  Educational handouts further explaining the pathology, treatment options, and bowel regimen were given as well.   Prescriptions for Anusol 2.5% and naproxen 500 mg given as well.  I would like to see her in a month to make sure she is continuing to improve.  She may benefit for repeat banding.  Multiple little will not come to the [facility] hemorrhoidal ligation/pexy the operating room: But, it may come to that if she worsens.  I would like her to have a better control for constipation first.  Hopefully since she is off the narcotics, that will improve as well.

## 2013-10-19 NOTE — Patient Instructions (Signed)
ANORECTAL SURGERY: POST OP INSTRUCTIONS  1. Take your usually prescribed home medications unless otherwise directed. 2. DIET: Follow a light bland diet the first 24 hours after arrival home, such as soup, liquids, crackers, etc.  Be sure to include lots of fluids daily.  Avoid fast food or heavy meals as your are more likely to get nauseated.  Eat a low fat the next few days after surgery.   3. PAIN CONTROL: a. Pain is best controlled by a usual combination of three different methods TOGETHER: i. Ice/Heat ii. Over the counter pain medication iii. Prescription pain medication b. Most patients will experience some swelling and discomfort in the anus/rectal area. and incisions.  Ice packs or heat (30-60 minutes up to 6 times a day) will help. Use ice for the first few days to help decrease swelling and bruising, then switch to heat such as warm towels, sitz baths, warm baths, etc to help relax tight/sore spots and speed recovery.  Some people prefer to use ice alone, heat alone, alternating between ice & heat.  Experiment to what works for you.  Swelling and bruising can take several weeks to resolve.   c. It is helpful to take an over-the-counter pain medication regularly for the first few weeks.  Choose one of the following that works best for you: i. Naproxen (Aleve, etc)  Two 220mg tabs twice a day ii. Ibuprofen (Advil, etc) Three 200mg tabs four times a day (every meal & bedtime) iii. Acetaminophen (Tylenol, etc) 500-650mg four times a day (every meal & bedtime) d. A  prescription for pain medication (such as oxycodone, hydrocodone, etc) should be given to you upon discharge.  Take your pain medication as prescribed.  i. If you are having problems/concerns with the prescription medicine (does not control pain, nausea, vomiting, rash, itching, etc), please call us [phone] to see if we need to switch you to a different pain medicine that will work better for you and/or control your side effect  better. ii. If you need a refill on your pain medication, please contact your pharmacy.  They will contact our office to request authorization. Prescriptions will not be filled after 5 pm or on week-ends.  Use a Sitz Bath 4-8 times a day for relief A sitz bath is a warm water bath taken in the sitting position that covers only the hips and buttocks. It may be used for either healing or hygiene purposes. Sitz baths are also used to relieve pain, itching, or muscle spasms. The water may contain medicine. Moist heat will help you heal and relax.  HOME CARE INSTRUCTIONS  Take 3 to 4 sitz baths a day. 1. Fill the bathtub half full with warm water. 2. Sit in the water and open the drain a little. 3. Turn on the warm water to keep the tub half full. Keep the water running constantly. 4. Soak in the water for 15 to 20 minutes. 5. After the sitz bath, pat the affected area dry first. SEEK MEDICAL CARE IF:  You get worse instead of better. Stop the sitz baths if you get worse.   4. KEEP YOUR BOWELS REGULAR a. The goal is one bowel movement a day b. Avoid getting constipated.  Between the surgery and the pain medications, it is common to experience some constipation.  Increasing fluid intake and taking a fiber supplement (such as Metamucil, Citrucel, FiberCon, MiraLax, etc) 1-2 times a day regularly will usually help prevent this problem from occurring.  A mild   laxative (prune juice, Milk of Magnesia, MiraLax, etc) should be taken according to package directions if there are no bowel movements after 48 hours. c. Watch out for diarrhea.  If you have many loose bowel movements, simplify your diet to bland foods & liquids for a few days.  Stop any stool softeners and decrease your fiber supplement.  Switching to mild anti-diarrheal medications (Kayopectate, Pepto Bismol) can help.  If this worsens or does not improve, please call us.  5. Wound Care a. Remove your bandages the day after surgery.  Unless  discharge instructions indicate otherwise, leave your bandage dry and in place overnight.  Remove the bandage during your first bowel movement.   b. Allow the wound packing to fall out over the next few days.  You can trim exposed gauze / ribbon as it falls out.  You do not need to repack the wound unless instructed otherwise.  Wear an absorbent pad or soft cotton gauze in your underwear as needed to catch any drainage and help keep the area  c. Keep the area clean and dry.  Bathe / shower every day.  Keep the area clean by showering / bathing over the incision / wound.   It is okay to soak an open wound to help wash it.  Wet wipes or showers / gentle washing after bowel movements is often less traumatic than regular toilet paper. d. You may have some styrofoam-like soft packing in the rectum which will come out with the first bowel movement.  e. You will often notice bleeding with bowel movements.  This should slow down by the end of the first week of surgery f. Expect some drainage.  This should slow down, too, by the end of the first week of surgery.  Wear an absorbent pad or soft cotton gauze in your underwear until the drainage stops. 6. ACTIVITIES as tolerated:   a. You may resume regular (light) daily activities beginning the next day-such as daily self-care, walking, climbing stairs-gradually increasing activities as tolerated.  If you can walk 30 minutes without difficulty, it is safe to try more intense activity such as jogging, treadmill, bicycling, low-impact aerobics, swimming, etc. b. Save the most intensive and strenuous activity for last such as sit-ups, heavy lifting, contact sports, etc  Refrain from any heavy lifting or straining until you are off narcotics for pain control.   c. DO NOT PUSH THROUGH PAIN.  Let pain be your guide: If it hurts to do something, don't do it.  Pain is your body warning you to avoid that activity for another week until the pain goes down. d. You may drive when  you are no longer taking prescription pain medication, you can comfortably sit for long periods of time, and you can safely maneuver your car and apply brakes. e. You may have sexual intercourse when it is comfortable.  7. FOLLOW UP in our office a. Please call [facility] at [phone] to set up an appointment to see your surgeon in the office for a follow-up appointment approximately 2 weeks after your surgery. b. Make sure that you call for this appointment the day you arrive home to insure a convenient appointment time. 10. IF YOU HAVE DISABILITY OR FAMILY LEAVE FORMS, BRING THEM TO THE OFFICE FOR PROCESSING.  DO NOT GIVE THEM TO YOUR DOCTOR.        WHEN TO CALL US [phone]: 1. Poor pain control 2. Reactions / problems with new medications (rash/itching, nausea, etc)    3. Fever over 101.5 F (38.5 C) 4. Inability to urinate 5. Nausea and/or vomiting 6. Worsening swelling or bruising 7. Continued bleeding from incision. 8. Increased pain, redness, or drainage from the incision  The clinic staff is available to answer your questions during regular business hours (8:30am-5pm).  Please don't hesitate to call and ask to speak to one of our nurses for clinical concerns.   A surgeon from [facility] is always on call at the hospitals   If you have a medical emergency, go to the nearest emergency room or call 911.    [facility] [address], [address], [place], [place]  [address] ? MAIN: [phone] ? TOLL FREE: [phone] ? FAX [phone] [email or web address]  HEMORRHOIDS  The rectum is the last foot of your colon, and it naturally stretches to hold stool.  Hemorrhoidal piles are natural clusters of blood vessels that help the rectum and anal canal stretch to hold stool and allow bowel movements to eliminate feces.   Hemorrhoids are abnormally swollen blood vessels in the rectum.  Too much pressure in the rectum causes  hemorrhoids by forcing blood to stretch and bulge the walls of the veins, sometimes even rupturing them.  Hemorrhoids can become like varicose veins you might see on a person's legs.  Most people will develop a flare of hemorrhoids in their lifetime.  When bulging hemorrhoidal veins are irritated, they can swell, burn, itch, cause pain, and bleed.  Most flares will calm down gradually own within a few weeks.  However, once hemorrhoids are created, they are difficult to get rid of completely and tend to flare more easily than the first flare.   Fortunately, good habits and simple medical treatment usually control hemorrhoids well, and surgery is needed only in severe cases. Types of Hemorrhoids:  Internal hemorrhoids usually don't initially hurt or itch; they are deep inside the rectum and usually have no sensation. If they begin to push out (prolapse), pain and burning can occur.  However, internal hemorrhoids can bleed.  Anal bleeding should not be ignored since bleeding could come from a dangerous source like colorectal cancer, so persistent rectal bleeding should be investigated by a doctor, sometimes with a colonoscopy.  External hemorrhoids cause most of the symptoms - pain, burning, and itching. Nonirritated hemorrhoids can look like small skin tags coming out of the anus.   Thrombosed hemorrhoids can form when a hemorrhoid blood vessel bursts and causes the hemorrhoid to suddenly swell.  A purple blood clot can form in it and become an excruciatingly painful lump at the anus. Because of these unpleasant symptoms, immediate incision and drainage by a surgeon at an office visit can provide much relief of the pain.    PREVENTION Avoiding the most frequent causes listed below will prevent most cases of hemorrhoids: Constipation Hard stools Diarrhea  Constant sitting  Straining with bowel movements Sitting on the toilet for a long time  Severe coughing  episodes Pregnancy / Childbirth  Heavy  Lifting  Sometimes avoiding the above triggers is difficult:  How can you avoid sitting all day if you have a seated job? Also, we try to avoid coughing and diarrhea, but sometimes it's beyond your control.  Still, there are some practical hints to help: Keep the anal and genital area clean.  Moistened tissues such as flushable wet wipes are less irritating than toilet paper.  Using irrigating showers or bottle irrigation washing gently cleans this sensitive area.     Avoid dry toilet paper when cleaning after bowel movements.  . Keep the anal and genital area dry.  Lightly pat the rectal area dry.  Avoid rubbing.  Talcum or baby powders can help GET YOUR STOOLS SOFT.   This is the most important way to prevent irritated hemorrhoids.  Hard stools are like sandpaper to the anorectal canal and will cause more problems.  The goal: ONE SOFT BOWEL MOVEMENT A DAY!  BMs from every other day to 3 times a day is a tolerable range Treat coughing, diarrhea and constipation early since irritated hemorrhoids may soon follow.  If your main job activity is seated, always stand or walk during your breaks. Make it a point to stand and walk at least 5 minutes every hour and try to shift frequently in your chair to avoid direct rectal pressure.  Always exhale as you strain or lift. Don't hold your breath.  Do not delay or try to prevent a bowel movement when the urge is present. Exercise regularly (walking or jogging 60 minutes a day) to stimulate the bowels to move. No reading or other activity while on the toilet. If bowel movements take longer than 5 minutes, you are too constipated. AVOID CONSTIPATION Drink plenty of liquids (1 1/2 to 2 quarts of water and other fluids a day unless fluid restricted for another medical condition). Liquids that contain caffeine (coffee a, tea, soft drinks) can be dehydrating and should be avoided until constipation is controlled. Consider minimizing milk, as dairy products may be  constipating. Eat plenty of fiber (30g a day ideal, more if needed).  Fiber is the undigested part of plant food that passes into the colon, acting as "natures broom" to encourage bowel motility and movement.  Fiber can absorb and hold large amounts of water. This results in a larger, bulkier stool, which is soft and easier to pass.  Eating foods high in fiber - 12 servings - such as  Vegetables: Root (potatoes, carrots, turnips), Leafy green (lettuce, salad greens, celery, spinach), High residue (cabbage, broccoli, etc.) Fruit: Fresh, Dried (prunes, apricots, cherries), Stewed (applesauce)  Whole grain breads, pasta, whole wheat Bran cereals, muffins, etc. Consider adding supplemental bulking fiber which retains large volumes of water: Psyllium ground seeds (native plant from [place])--available as Metamucil, Konsyl, Effersyllium, Per Diem Fiber, or the less expensive generic forms.  Citrucel  (methylcellulose wood fiber) . FiberCon (Polycarbophil) Polyethylene Glycol - and "artificial" fiber commonly called Miralax or Glycolax.  It is helpful for people with gassy or bloated feelings with regular fiber Flax Seed - a less gassy natural fiber  Laxatives can be useful for a short period if constipation is severe Osmotics (Milk of Magnesia, Fleets Phospho-Soda, Magnesium Citrate)  Stimulants (Senokot,   Castor Oil,  Dulcolax, Ex-Lax)    Laxatives are not a good long-term solution as it can stress the bowels and cause too much mineral loss and dehydration.   Avoid taking laxatives for more than 7 days in a row.  AVOID DIARRHEA Switch to liquids and simpler foods for a few days to avoid stressing your intestines further. Avoid dairy products (especially milk & ice cream) for a short time.  The intestines often can lose the ability to digest lactose when stressed. Avoid foods that cause gassiness or bloating.  Typical foods include beans and other legumes, cabbage, broccoli, and dairy foods.   Every person has some sensitivity to other foods, so listen to your body and avoid those foods that trigger problems for   you. Adding fiber (Citrucel, Metamucil, FiberCon, Flax seed, Miralax) gradually can help thicken stools by absorbing excess fluid and retrain the intestines to act more normally.  Slowly increase the dose over a few weeks.  Too much fiber too soon can backfire and cause cramping & bloating. Probiotics (such as active yogurt, Align, etc) may help repopulate the intestines and colon with normal bacteria and calm down a sensitive digestive tract.  Most studies show it to be of mild help, though, and such products can be costly. Medicines: Bismuth subsalicylate (ex. Kayopectate, Pepto Bismol) every 30 minutes for up to 6 doses can help control diarrhea.  Avoid if pregnant. Loperamide (Immodium) can slow down diarrhea.  Start with two tablets (4mg total) first and then try one tablet every 6 hours.  Avoid if you are having fevers or severe pain.  If you are not better or start feeling worse, stop all medicines and call your doctor for advice Call your doctor if you are getting worse or not better.  Sometimes further testing (cultures, endoscopy, X-ray studies, bloodwork, etc) may be needed to help diagnose and treat the cause of the diarrhea. TREATMENT OF HEMORRHOID FLARE If these preventive measures fail, you must take action right away! Hemorrhoids are one condition that can be mild in the morning and become intolerable by nightfall. Most hemorrhoidal flares take several weeks to calm down.  These suggestions can help: Warm soaks.  This helps more than any topical medication.  Use up to 8 times a day.  Usually sitz baths or sitting in a warm bathtub helps.  Sitting on moist warm towels are helpful.  Switching to ice packs/cool compresses can be helpful  Use a Sitz Bath 4-8 times a day for relief A sitz bath is a warm water bath taken in the sitting position that covers only the hips and  buttocks. It may be used for either healing or hygiene purposes. Sitz baths are also used to relieve pain, itching, or muscle spasms. The water may contain medicine. Moist heat will help you heal and relax.  HOME CARE INSTRUCTIONS  Take 3 to 4 sitz baths a day. 6. Fill the bathtub half full with warm water. 7. Sit in the water and open the drain a little. 8. Turn on the warm water to keep the tub half full. Keep the water running constantly. 9. Soak in the water for 15 to 20 minutes. 10. After the sitz bath, pat the affected area dry first. SEEK MEDICAL CARE IF:  You get worse instead of better. Stop the sitz baths if you get worse.  Normalize your bowels.  Extremes of diarrhea or constipation will make hemorrhoids worse.  One soft bowel movement a day is the goal.  Fiber can help get your bowels regular Wet wipes instead of toilet paper Pain control with a NSAID such as ibuprofen (Advil) or naproxen (Aleve) or acetaminophen (Tylenol) around the clock.  Narcotics are constipating and should be minimized if possible Topical creams contain steroids (bydrocortisone) or local anesthetic (xylocaine) can help make pain and itching more tolerable.   EVALUATION If hemorrhoids are still causing problems, you could benefit by an evaluation by a surgeon.  The surgeon will obtain a history and examine you.  If hemorrhoids are diagnosed, some therapies can be offered in the office, usually with an anoscope into the less sensitive area of the rectum: -injection of hemorrhoids (sclerotherapy) can scar the blood vessels of the swollen/enlarged hemorrhoids to help shrink them down to   a more normal size -rubber banding of the enlarged hemorrhoids to help shrink them down to a more normal size -drainage of the blood clot causing a thrombosed hemorrhoid,  to relieve the severe pain   While 90% of the time such problems from hemorrhoids can be managed without preceding to surgery, sometimes the hemorrhoids require a  operation to control the problem (uncontrolled bleeding, prolapse, pain, etc.).   This involves being placed under general anesthesia where the surgeon can confirm the diagnosis and remove, suture, or staple the hemorrhoid(s).  Your surgeon can help you treat the problem appropriately.    GETTING TO GOOD BOWEL HEALTH. Irregular bowel habits such as constipation and diarrhea can lead to many problems over time.  Having one soft bowel movement a day is the most important way to prevent further problems.  The anorectal canal is designed to handle stretching and feces to safely manage our ability to get rid of solid waste (feces, poop, stool) out of our body.  BUT, hard constipated stools can act like ripping concrete bricks and diarrhea can be a burning fire to this very sensitive area of our body, causing inflamed hemorrhoids, anal fissures, increasing risk is perirectal abscesses, abdominal pain/bloating, an making irritable bowel worse.     The goal: ONE SOFT BOWEL MOVEMENT A DAY!  To have soft, regular bowel movements:    Drink at least 8 tall glasses of water a day.     Take plenty of fiber.  Fiber is the undigested part of plant food that passes into the colon, acting s "natures broom" to encourage bowel motility and movement.  Fiber can absorb and hold large amounts of water. This results in a larger, bulkier stool, which is soft and easier to pass. Work gradually over several weeks up to 6 servings a day of fiber (25g a day even more if needed) in the form of: o Vegetables -- Root (potatoes, carrots, turnips), leafy green (lettuce, salad greens, celery, spinach), or cooked high residue (cabbage, broccoli, etc) o Fruit -- Fresh (unpeeled skin & pulp), Dried (prunes, apricots, cherries, etc ),  or stewed ( applesauce)  o Whole grain breads, pasta, etc (whole wheat)  o Bran cereals    Bulking Agents -- This type of water-retaining fiber generally is easily obtained each day by one of the following:   o Psyllium bran -- The psyllium plant is remarkable because its ground seeds can retain so much water. This product is available as Metamucil, Konsyl, Effersyllium, Per Diem Fiber, or the less expensive generic preparation in drug and health food stores. Although labeled a laxative, it really is not a laxative.  o Methylcellulose -- This is another fiber derived from wood which also retains water. It is available as Citrucel. o Polyethylene Glycol - and "artificial" fiber commonly called Miralax or Glycolax.  It is helpful for people with gassy or bloated feelings with regular fiber o Flax Seed - a less gassy fiber than psyllium   No reading or other relaxing activity while on the toilet. If bowel movements take longer than 5 minutes, you are too constipated   AVOID CONSTIPATION.  High fiber and water intake usually takes care of this.  Sometimes a laxative is needed to stimulate more frequent bowel movements, but    Laxatives are not a good long-term solution as it can wear the colon out. o Osmotics (Milk of Magnesia, Fleets phosphosoda, Magnesium citrate, MiraLax, GoLytely) are safer than  o Stimulants (Senokot, Castor Oil, Dulcolax,   Ex Lax)    o Do not take laxatives for more than 7days in a row.    IF SEVERELY CONSTIPATED, try a Bowel Retraining Program: o Do not use laxatives.  o Eat a diet high in roughage, such as bran cereals and leafy vegetables.  o Drink six (6) ounces of prune or apricot juice each morning.  o Eat two (2) large servings of stewed fruit each day.  o Take one (1) heaping tablespoon of a psyllium-based bulking agent twice a day. Use sugar-free sweetener when possible to avoid excessive calories.  o Eat a normal breakfast.  o Set aside 15 minutes after breakfast to sit on the toilet, but do not strain to have a bowel movement.  o If you do not have a bowel movement by the third day, use an enema and repeat the above steps.    Controlling diarrhea o Switch to liquids and  simpler foods for a few days to avoid stressing your intestines further. o Avoid dairy products (especially milk & ice cream) for a short time.  The intestines often can lose the ability to digest lactose when stressed. o Avoid foods that cause gassiness or bloating.  Typical foods include beans and other legumes, cabbage, broccoli, and dairy foods.  Every person has some sensitivity to other foods, so listen to our body and avoid those foods that trigger problems for you. o Adding fiber (Citrucel, Metamucil, psyllium, Miralax) gradually can help thicken stools by absorbing excess fluid and retrain the intestines to act more normally.  Slowly increase the dose over a few weeks.  Too much fiber too soon can backfire and cause cramping & bloating. o Probiotics (such as active yogurt, Align, etc) may help repopulate the intestines and colon with normal bacteria and calm down a sensitive digestive tract.  Most studies show it to be of mild help, though, and such products can be costly. o Medicines:   Bismuth subsalicylate (ex. Kayopectate, Pepto Bismol) every 30 minutes for up to 6 doses can help control diarrhea.  Avoid if pregnant.   Loperamide (Immodium) can slow down diarrhea.  Start with two tablets (4mg total) first and then try one tablet every 6 hours.  Avoid if you are having fevers or severe pain.  If you are not better or start feeling worse, stop all medicines and call your doctor for advice o Call your doctor if you are getting worse or not better.  Sometimes further testing (cultures, endoscopy, X-ray studies, bloodwork, etc) may be needed to help diagnose and treat the cause of the diarrhea. o  

## 2013-11-15 ENCOUNTER — Encounter (INDEPENDENT_AMBULATORY_CARE_PROVIDER_SITE_OTHER): Payer: Self-pay | Admitting: Surgery

## 2013-11-15 ENCOUNTER — Ambulatory Visit (INDEPENDENT_AMBULATORY_CARE_PROVIDER_SITE_OTHER): Payer: PRIVATE HEALTH INSURANCE | Admitting: Surgery

## 2013-11-15 VITALS — BP 132/76 | HR 68 | Resp 20 | Ht 64.0 in | Wt 235.2 lb

## 2013-11-15 DIAGNOSIS — K5909 Other constipation: Secondary | ICD-10-CM | POA: Insufficient documentation

## 2013-11-15 DIAGNOSIS — K648 Other hemorrhoids: Secondary | ICD-10-CM

## 2013-11-15 DIAGNOSIS — K5900 Constipation, unspecified: Secondary | ICD-10-CM

## 2013-11-15 MED ORDER — OXYCODONE HCL 5 MG PO TABS: 5.0000 mg | ORAL_TABLET | Freq: Four times a day (QID) | ORAL | Status: DC | PRN

## 2013-11-15 NOTE — Progress Notes (Signed)
Subjective:     Patient ID: Ann Watson, female   DOB: [birth year], 42 y.o.   MRN: [id number]  HPI   Ann Watson  28-Nov-1971 [id number]  Patient Care Team: Catha Gosselin, MD as PCP - General (Family Medicine)  This patient is a 42 y.o.female who presents today for surgical evaluation at the request of self.   Reason for visit: Hemorrhoids s/p Hem Bending x 3 piles 10/19/2013  Pleasant woman that is struggled with rectal pain for many years.  She is seen her group numerous times.  She has been offered surgery for hemorrhoids such had declined with flares proving.  She was seen two years ago with a fissure and hemorrhoids.  She notes the diltiazem cream help that out.     It has gotten better this week but still sensitive.    She had another flare.  She sheepishly confessed to me that she went to [facility] to a holistic center.  I provided treatment of her hemorrhoids with "low voltage".  Apparently, a probe was placed in her anal canal for 15 minutes.  Became exquisitely painful.  This was two weeks ago.  She was worried that something was damaged.  No incontinence to flatus or stool.  No severe bleeding.  Used up some old hydrocodone and suppositories.  It made her more constipated.  Eventually weaned her off the narcotics.   Performed hemorrhoidal banding last month.  She noted things seemed to improve.  However, she still struggles with constipation.  An episode of having to disimpact things.  That caused more pain and swelling.  The proximal and did not work.  Near the narcotics to control things.  No fever or chills.  She feels somewhat frustrated: " I am in horrible shape."  Patient Active Problem List   Diagnosis Date Noted  . Internal hemorrhoids with prolapse/pain 08/07/2011    Past Medical History  Diagnosis Date  . Chronic kidney disease     kidney stone extraction  . Fatigue   . Hemorrhoids   . External hemorrhoid, thrombosed 08/07/2011  . Obese   . Anal fissure 08/07/2011     Past Surgical History  Procedure Laterality Date  . Kidney stone surgery      History   Social History  . Marital Status: Married    Spouse Name: N/A    Number of Children: N/A  . Years of Education: N/A   Occupational History  . Not on file.   Social History Main Topics  . Smoking status: Former Smoker    Quit date: 08/10/2007  . Smokeless tobacco: Never Used  . Alcohol Use: Yes     Comment: wine - weekly  . Drug Use: No  . Sexual Activity: Not on file   Other Topics Concern  . Not on file   Social History Narrative  . No narrative on file    Family History  Problem Relation Age of Onset  . Diabetes Mother     Current Outpatient Prescriptions  Medication Sig Dispense Refill  . hydrocortisone (ANUSOL-HC) 2.5 % rectal cream Place rectally 2 (two) times daily. Apply around anus for irritated & painful hemorrhoids  15 g  2  . naproxen (NAPROSYN) 500 MG tablet Take 1 tablet (500 mg total) by mouth 2 (two) times daily with a meal.  40 tablet  1   No current facility-administered medications for this visit.     No Known Allergies  BP 132/76  Pulse 68  Resp 20  Ht  5\' 4"  (1.626 m)  Wt 235 lb 3.2 oz (106.686 kg)  BMI 40.35 kg/m2  No results found.   Review of Systems  Constitutional: Negative for fever, chills, diaphoresis, appetite change and fatigue.  HENT: Negative for ear discharge, ear pain, sore throat and trouble swallowing.   Eyes: Negative for photophobia, discharge and visual disturbance.  Respiratory: Negative for cough, choking, chest tightness and shortness of breath.   Cardiovascular: Negative for chest pain and palpitations.  Gastrointestinal: Positive for anal bleeding and rectal pain. Negative for nausea, vomiting, abdominal pain, diarrhea, constipation, blood in stool and abdominal distention.  Endocrine: Negative for cold intolerance and heat intolerance.  Genitourinary: Negative for dysuria, frequency and difficulty urinating.   Musculoskeletal: Negative for gait problem, myalgias and neck pain.  Skin: Negative for color change, pallor and rash.  Allergic/Immunologic: Negative for environmental allergies, food allergies and immunocompromised state.  Neurological: Negative for dizziness, speech difficulty, weakness and numbness.  Hematological: Negative for adenopathy.  Psychiatric/Behavioral: Negative for confusion and agitation. The patient is not nervous/anxious.        Objective:   Physical Exam  Constitutional: She is oriented to person, place, and time. She appears well-developed and well-nourished. No distress.  HENT:  Head: Normocephalic.  Mouth/Throat: Oropharynx is clear and moist. No oropharyngeal exudate.  Eyes: Conjunctivae and EOM are normal. Pupils are equal, round, and reactive to light. No scleral icterus.  Neck: Normal range of motion. Neck supple. No tracheal deviation present.  Cardiovascular: Normal rate, regular rhythm and intact distal pulses.   Pulmonary/Chest: Effort normal and breath sounds normal. No stridor. No respiratory distress. She exhibits no tenderness.  Abdominal: Soft. She exhibits no distension and no mass. There is no tenderness. Hernia confirmed negative in the right inguinal area and confirmed negative in the left inguinal area.  No hernias  Genitourinary: No vaginal discharge found.  Exam done with assistance of female Medical Assistant in the room.  Perianal skin clean with good hygiene.  No pruritis.  Mildly stretched/redundant left lateral anoderm but no distinct ext hemorrhoids   No pilonidal disease.  No abscess/fistula.    Posterior midline anal fissure healed.   Normal sphincter tone.  Hemorrhoidal piles slightly rotated.  Sensitive enlarged internal hemorrhoidal piles especially right posterior lateral > Left lateral.  Right anterior milder   Musculoskeletal: Normal range of motion. She exhibits no tenderness.       Right elbow: She exhibits normal range of  motion.       Left elbow: She exhibits normal range of motion.       Right wrist: She exhibits normal range of motion.       Left wrist: She exhibits normal range of motion.       Right hand: Normal strength noted.       Left hand: Normal strength noted.  Lymphadenopathy:       Head (right side): No posterior auricular adenopathy present.       Head (left side): No posterior auricular adenopathy present.    She has no cervical adenopathy.    She has no axillary adenopathy.       Right: No inguinal adenopathy present.       Left: No inguinal adenopathy present.  Neurological: She is alert and oriented to person, place, and time. No cranial nerve deficit. She exhibits normal muscle tone. Coordination normal.  Skin: Skin is warm and dry. No rash noted. She is not diaphoretic. No erythema.  Psychiatric: She has a normal mood  and affect. Her behavior is normal. Judgment and thought content normal.       Assessment:     Enlarged internal hemorrhoids - somewhat improved after banding    Plan:   I do not think any permanent damage was done.  I am skeptical that the hemorrhoids were markedly improved with this alternative intervention.  She feels reassured that there is no major damage occurring   Because she has some persistent piles, offered another office intervention.  She wished to be aggressive.  Attempted banding, but the band without stay on as the right posterior and left lateral femoral piles were too thickened.  I therefore switched over to injection of all 3 piles:  The anatomy & physiology of the anorectal region was discussed.  The pathophysiology of hemorrhoids and differential diagnosis was discussed.  Natural history progression  was discussed.   I stressed the importance of a bowel regimen to have daily soft bowel movements to minimize progression of disease.     The patient's symptoms are not adequately controlled.  Therefore, I recommended injection to treat the hemorrhoids.   I went over the technique, risks, benefits, and alternatives.   Goals of post-operative recovery were discussed as well.  Questions were answered.  The patient expressed understanding & wished to proceed.  The patient was positioned in the lateral decubitus position.  Perianal & rectal examination was done.  Using anoscopy, I injected the 3 piles of hemorrhoids above the dentate line with a liquid sclerosing agent.  The patient tolerated the procedure well.  Educational handouts further explaining the pathology, treatment options, and bowel regimen were given as well.   Prescriptions for oxycodone given as well.  Increase activity as tolerated to regular activity.  Low impact exercise such as walking an hour a day at least ideal.  Do not push through pain.  Diet as tolerated.  Low fat high fiber diet ideal.  Bowel regimen with 30 g fiber a day and fiber supplement as needed to avoid problems.  Switch to MiraLax or flaxseed to avoid the gassiness of the "gummi bear" fiber supplements  Instructions discussed.  Followup with primary care physician for other health issues as would normally be done. Questions answered.  The patient expressed understanding and appreciation.  I would like to see her in a month to make sure she is continuing to improve.  If she still struggles with persistent pain despite improvement in her constipation, THD hemorrhoidal ligation/pexy the operating room.  It may come to that if she worsens.  I would like her to have a better control for constipation first.  Hopefully since she is off the narcotics, that will improve as well.

## 2013-11-15 NOTE — Patient Instructions (Addendum)
ANORECTAL SURGERY: POST OP INSTRUCTIONS  1. Take your usually prescribed home medications unless otherwise directed. 2. DIET: Follow a light bland diet the first 24 hours after arrival home, such as soup, liquids, crackers, etc.  Be sure to include lots of fluids daily.  Avoid fast food or heavy meals as your are more likely to get nauseated.  Eat a low fat the next few days after surgery.   3. PAIN CONTROL: a. Pain is best controlled by a usual combination of three different methods TOGETHER: i. Ice/Heat ii. Over the counter pain medication iii. Prescription pain medication b. Most patients will experience some swelling and discomfort in the anus/rectal area. and incisions.  Ice packs or heat (30-60 minutes up to 6 times a day) will help. Use ice for the first few days to help decrease swelling and bruising, then switch to heat such as warm towels, sitz baths, warm baths, etc to help relax tight/sore spots and speed recovery.  Some people prefer to use ice alone, heat alone, alternating between ice & heat.  Experiment to what works for you.  Swelling and bruising can take several weeks to resolve.   c. It is helpful to take an over-the-counter pain medication regularly for the first few weeks.  Choose one of the following that works best for you: i. Naproxen (Aleve, etc)  Two 220mg tabs twice a day ii. Ibuprofen (Advil, etc) Three 200mg tabs four times a day (every meal & bedtime) iii. Acetaminophen (Tylenol, etc) 500-650mg four times a day (every meal & bedtime) d. A  prescription for pain medication (such as oxycodone, hydrocodone, etc) should be given to you upon discharge.  Take your pain medication as prescribed.  i. If you are having problems/concerns with the prescription medicine (does not control pain, nausea, vomiting, rash, itching, etc), please call us [phone] to see if we need to switch you to a different pain medicine that will work better for you and/or control your side effect  better. ii. If you need a refill on your pain medication, please contact your pharmacy.  They will contact our office to request authorization. Prescriptions will not be filled after 5 pm or on week-ends.  Use a Sitz Bath 4-8 times a day for relief A sitz bath is a warm water bath taken in the sitting position that covers only the hips and buttocks. It may be used for either healing or hygiene purposes. Sitz baths are also used to relieve pain, itching, or muscle spasms. The water may contain medicine. Moist heat will help you heal and relax.  HOME CARE INSTRUCTIONS  Take 3 to 4 sitz baths a day. 1. Fill the bathtub half full with warm water. 2. Sit in the water and open the drain a little. 3. Turn on the warm water to keep the tub half full. Keep the water running constantly. 4. Soak in the water for 15 to 20 minutes. 5. After the sitz bath, pat the affected area dry first. SEEK MEDICAL CARE IF:  You get worse instead of better. Stop the sitz baths if you get worse.   4. KEEP YOUR BOWELS REGULAR a. The goal is one bowel movement a day b. Avoid getting constipated.  Between the surgery and the pain medications, it is common to experience some constipation.  Increasing fluid intake and taking a fiber supplement (such as Metamucil, Citrucel, FiberCon, MiraLax, etc) 1-2 times a day regularly will usually help prevent this problem from occurring.  A mild   laxative (prune juice, Milk of Magnesia, MiraLax, etc) should be taken according to package directions if there are no bowel movements after 48 hours. c. Watch out for diarrhea.  If you have many loose bowel movements, simplify your diet to bland foods & liquids for a few days.  Stop any stool softeners and decrease your fiber supplement.  Switching to mild anti-diarrheal medications (Kayopectate, Pepto Bismol) can help.  If this worsens or does not improve, please call us.  5. Wound Care a. Remove your bandages the day after surgery.  Unless  discharge instructions indicate otherwise, leave your bandage dry and in place overnight.  Remove the bandage during your first bowel movement.   b. Allow the wound packing to fall out over the next few days.  You can trim exposed gauze / ribbon as it falls out.  You do not need to repack the wound unless instructed otherwise.  Wear an absorbent pad or soft cotton gauze in your underwear as needed to catch any drainage and help keep the area  c. Keep the area clean and dry.  Bathe / shower every day.  Keep the area clean by showering / bathing over the incision / wound.   It is okay to soak an open wound to help wash it.  Wet wipes or showers / gentle washing after bowel movements is often less traumatic than regular toilet paper. d. You may have some styrofoam-like soft packing in the rectum which will come out with the first bowel movement.  e. You will often notice bleeding with bowel movements.  This should slow down by the end of the first week of surgery f. Expect some drainage.  This should slow down, too, by the end of the first week of surgery.  Wear an absorbent pad or soft cotton gauze in your underwear until the drainage stops. 6. ACTIVITIES as tolerated:   a. You may resume regular (light) daily activities beginning the next day-such as daily self-care, walking, climbing stairs-gradually increasing activities as tolerated.  If you can walk 30 minutes without difficulty, it is safe to try more intense activity such as jogging, treadmill, bicycling, low-impact aerobics, swimming, etc. b. Save the most intensive and strenuous activity for last such as sit-ups, heavy lifting, contact sports, etc  Refrain from any heavy lifting or straining until you are off narcotics for pain control.   c. DO NOT PUSH THROUGH PAIN.  Let pain be your guide: If it hurts to do something, don't do it.  Pain is your body warning you to avoid that activity for another week until the pain goes down. d. You may drive when  you are no longer taking prescription pain medication, you can comfortably sit for long periods of time, and you can safely maneuver your car and apply brakes. e. You may have sexual intercourse when it is comfortable.  7. FOLLOW UP in our office a. Please call [facility] at [phone] to set up an appointment to see your surgeon in the office for a follow-up appointment approximately 2 weeks after your surgery. b. Make sure that you call for this appointment the day you arrive home to insure a convenient appointment time. 10. IF YOU HAVE DISABILITY OR FAMILY LEAVE FORMS, BRING THEM TO THE OFFICE FOR PROCESSING.  DO NOT GIVE THEM TO YOUR DOCTOR.        WHEN TO CALL US [phone]: 1. Poor pain control 2. Reactions / problems with new medications (rash/itching, nausea, etc)    3. Fever over 101.5 F (38.5 C) 4. Inability to urinate 5. Nausea and/or vomiting 6. Worsening swelling or bruising 7. Continued bleeding from incision. 8. Increased pain, redness, or drainage from the incision  The clinic staff is available to answer your questions during regular business hours (8:30am-5pm).  Please don't hesitate to call and ask to speak to one of our nurses for clinical concerns.   A surgeon from [facility] is always on call at the hospitals   If you have a medical emergency, go to the nearest emergency room or call 911.    [facility] [address], [address], [place],   [address] ? MAIN: [phone] ? TOLL FREE: [phone] ? FAX [phone] [email or web address]  HEMORRHOIDS  The rectum is the last foot of your colon, and it naturally stretches to hold stool.  Hemorrhoidal piles are natural clusters of blood vessels that help the rectum and anal canal stretch to hold stool and allow bowel movements to eliminate feces.   Hemorrhoids are abnormally swollen blood vessels in the rectum.  Too much pressure in the rectum causes  hemorrhoids by forcing blood to stretch and bulge the walls of the veins, sometimes even rupturing them.  Hemorrhoids can become like varicose veins you might see on a person's legs.  Most people will develop a flare of hemorrhoids in their lifetime.  When bulging hemorrhoidal veins are irritated, they can swell, burn, itch, cause pain, and bleed.  Most flares will calm down gradually own within a few weeks.  However, once hemorrhoids are created, they are difficult to get rid of completely and tend to flare more easily than the first flare.   Fortunately, good habits and simple medical treatment usually control hemorrhoids well, and surgery is needed only in severe cases. Types of Hemorrhoids:  Internal hemorrhoids usually don't initially hurt or itch; they are deep inside the rectum and usually have no sensation. If they begin to push out (prolapse), pain and burning can occur.  However, internal hemorrhoids can bleed.  Anal bleeding should not be ignored since bleeding could come from a dangerous source like colorectal cancer, so persistent rectal bleeding should be investigated by a doctor, sometimes with a colonoscopy.  External hemorrhoids cause most of the symptoms - pain, burning, and itching. Nonirritated hemorrhoids can look like small skin tags coming out of the anus.   Thrombosed hemorrhoids can form when a hemorrhoid blood vessel bursts and causes the hemorrhoid to suddenly swell.  A purple blood clot can form in it and become an excruciatingly painful lump at the anus. Because of these unpleasant symptoms, immediate incision and drainage by a surgeon at an office visit can provide much relief of the pain.    PREVENTION Avoiding the most frequent causes listed below will prevent most cases of hemorrhoids: Constipation Hard stools Diarrhea  Constant sitting  Straining with bowel movements Sitting on the toilet for a long time  Severe coughing  episodes Pregnancy / Childbirth  Heavy  Lifting  Sometimes avoiding the above triggers is difficult:  How can you avoid sitting all day if you have a seated job? Also, we try to avoid coughing and diarrhea, but sometimes it's beyond your control.  Still, there are some practical hints to help: Keep the anal and genital area clean.  Moistened tissues such as flushable wet wipes are less irritating than toilet paper.  Using irrigating showers or bottle irrigation washing gently cleans this sensitive area.     Avoid dry toilet paper when cleaning after bowel movements.  . Keep the anal and genital area dry.  Lightly pat the rectal area dry.  Avoid rubbing.  Talcum or baby powders can help GET YOUR STOOLS SOFT.   This is the most important way to prevent irritated hemorrhoids.  Hard stools are like sandpaper to the anorectal canal and will cause more problems.  The goal: ONE SOFT BOWEL MOVEMENT A DAY!  BMs from every other day to 3 times a day is a tolerable range Treat coughing, diarrhea and constipation early since irritated hemorrhoids may soon follow.  If your main job activity is seated, always stand or walk during your breaks. Make it a point to stand and walk at least 5 minutes every hour and try to shift frequently in your chair to avoid direct rectal pressure.  Always exhale as you strain or lift. Don't hold your breath.  Do not delay or try to prevent a bowel movement when the urge is present. Exercise regularly (walking or jogging 60 minutes a day) to stimulate the bowels to move. No reading or other activity while on the toilet. If bowel movements take longer than 5 minutes, you are too constipated. AVOID CONSTIPATION Drink plenty of liquids (1 1/2 to 2 quarts of water and other fluids a day unless fluid restricted for another medical condition). Liquids that contain caffeine (coffee a, tea, soft drinks) can be dehydrating and should be avoided until constipation is controlled. Consider minimizing milk, as dairy products may be  constipating. Eat plenty of fiber (30g a day ideal, more if needed).  Fiber is the undigested part of plant food that passes into the colon, acting as "natures broom" to encourage bowel motility and movement.  Fiber can absorb and hold large amounts of water. This results in a larger, bulkier stool, which is soft and easier to pass.  Eating foods high in fiber - 12 servings - such as  Vegetables: Root (potatoes, carrots, turnips), Leafy green (lettuce, salad greens, celery, spinach), High residue (cabbage, broccoli, etc.) Fruit: Fresh, Dried (prunes, apricots, cherries), Stewed (applesauce)  Whole grain breads, pasta, whole wheat Bran cereals, muffins, etc. Consider adding supplemental bulking fiber which retains large volumes of water: Psyllium ground seeds (native plant from [place])--available as Metamucil, Konsyl, Effersyllium, Per Diem Fiber, or the less expensive generic forms.  Citrucel  (methylcellulose wood fiber) . FiberCon (Polycarbophil) Polyethylene Glycol - and "artificial" fiber commonly called Miralax or Glycolax.  It is helpful for people with gassy or bloated feelings with regular fiber Flax Seed - a less gassy natural fiber  Laxatives can be useful for a short period if constipation is severe Osmotics (Milk of Magnesia, Fleets Phospho-Soda, Magnesium Citrate)  Stimulants (Senokot,   Castor Oil,  Dulcolax, Ex-Lax)    Laxatives are not a good long-term solution as it can stress the bowels and cause too much mineral loss and dehydration.   Avoid taking laxatives for more than 7 days in a row.  AVOID DIARRHEA Switch to liquids and simpler foods for a few days to avoid stressing your intestines further. Avoid dairy products (especially milk & ice cream) for a short time.  The intestines often can lose the ability to digest lactose when stressed. Avoid foods that cause gassiness or bloating.  Typical foods include beans and other legumes, cabbage, broccoli, and dairy foods.   Every person has some sensitivity to other foods, so listen to your body and avoid those foods that trigger problems for   you. Adding fiber (Citrucel, Metamucil, FiberCon, Flax seed, Miralax) gradually can help thicken stools by absorbing excess fluid and retrain the intestines to act more normally.  Slowly increase the dose over a few weeks.  Too much fiber too soon can backfire and cause cramping & bloating. Probiotics (such as active yogurt, Align, etc) may help repopulate the intestines and colon with normal bacteria and calm down a sensitive digestive tract.  Most studies show it to be of mild help, though, and such products can be costly. Medicines: Bismuth subsalicylate (ex. Kayopectate, Pepto Bismol) every 30 minutes for up to 6 doses can help control diarrhea.  Avoid if pregnant. Loperamide (Immodium) can slow down diarrhea.  Start with two tablets (4mg total) first and then try one tablet every 6 hours.  Avoid if you are having fevers or severe pain.  If you are not better or start feeling worse, stop all medicines and call your doctor for advice Call your doctor if you are getting worse or not better.  Sometimes further testing (cultures, endoscopy, X-ray studies, bloodwork, etc) may be needed to help diagnose and treat the cause of the diarrhea. TREATMENT OF HEMORRHOID FLARE If these preventive measures fail, you must take action right away! Hemorrhoids are one condition that can be mild in the morning and become intolerable by nightfall. Most hemorrhoidal flares take several weeks to calm down.  These suggestions can help: Warm soaks.  This helps more than any topical medication.  Use up to 8 times a day.  Usually sitz baths or sitting in a warm bathtub helps.  Sitting on moist warm towels are helpful.  Switching to ice packs/cool compresses can be helpful  Use a Sitz Bath 4-8 times a day for relief A sitz bath is a warm water bath taken in the sitting position that covers only the hips and  buttocks. It may be used for either healing or hygiene purposes. Sitz baths are also used to relieve pain, itching, or muscle spasms. The water may contain medicine. Moist heat will help you heal and relax.  HOME CARE INSTRUCTIONS  Take 3 to 4 sitz baths a day. 6. Fill the bathtub half full with warm water. 7. Sit in the water and open the drain a little. 8. Turn on the warm water to keep the tub half full. Keep the water running constantly. 9. Soak in the water for 15 to 20 minutes. 10. After the sitz bath, pat the affected area dry first. SEEK MEDICAL CARE IF:  You get worse instead of better. Stop the sitz baths if you get worse.  Normalize your bowels.  Extremes of diarrhea or constipation will make hemorrhoids worse.  One soft bowel movement a day is the goal.  Fiber can help get your bowels regular Wet wipes instead of toilet paper Pain control with a NSAID such as ibuprofen (Advil) or naproxen (Aleve) or acetaminophen (Tylenol) around the clock.  Narcotics are constipating and should be minimized if possible Topical creams contain steroids (bydrocortisone) or local anesthetic (xylocaine) can help make pain and itching more tolerable.   EVALUATION If hemorrhoids are still causing problems, you could benefit by an evaluation by a surgeon.  The surgeon will obtain a history and examine you.  If hemorrhoids are diagnosed, some therapies can be offered in the office, usually with an anoscope into the less sensitive area of the rectum: -injection of hemorrhoids (sclerotherapy) can scar the blood vessels of the swollen/enlarged hemorrhoids to help shrink them down to   a more normal size -rubber banding of the enlarged hemorrhoids to help shrink them down to a more normal size -drainage of the blood clot causing a thrombosed hemorrhoid,  to relieve the severe pain   While 90% of the time such problems from hemorrhoids can be managed without preceding to surgery, sometimes the hemorrhoids require a  operation to control the problem (uncontrolled bleeding, prolapse, pain, etc.).   This involves being placed under general anesthesia where the surgeon can confirm the diagnosis and remove, suture, or staple the hemorrhoid(s).  Your surgeon can help you treat the problem appropriately.    GETTING TO GOOD BOWEL HEALTH. Irregular bowel habits such as constipation and diarrhea can lead to many problems over time.  Having one soft bowel movement a day is the most important way to prevent further problems.  The anorectal canal is designed to handle stretching and feces to safely manage our ability to get rid of solid waste (feces, poop, stool) out of our body.  BUT, hard constipated stools can act like ripping concrete bricks and diarrhea can be a burning fire to this very sensitive area of our body, causing inflamed hemorrhoids, anal fissures, increasing risk is perirectal abscesses, abdominal pain/bloating, an making irritable bowel worse.     The goal: ONE SOFT BOWEL MOVEMENT A DAY!  To have soft, regular bowel movements:    Drink at least 8 tall glasses of water a day.     Take plenty of fiber.  Fiber is the undigested part of plant food that passes into the colon, acting s "natures broom" to encourage bowel motility and movement.  Fiber can absorb and hold large amounts of water. This results in a larger, bulkier stool, which is soft and easier to pass. Work gradually over several weeks up to 6 servings a day of fiber (25g a day even more if needed) in the form of: o Vegetables -- Root (potatoes, carrots, turnips), leafy green (lettuce, salad greens, celery, spinach), or cooked high residue (cabbage, broccoli, etc) o Fruit -- Fresh (unpeeled skin & pulp), Dried (prunes, apricots, cherries, etc ),  or stewed ( applesauce)  o Whole grain breads, pasta, etc (whole wheat)  o Bran cereals    Bulking Agents -- This type of water-retaining fiber generally is easily obtained each day by one of the following:   o Psyllium bran -- The psyllium plant is remarkable because its ground seeds can retain so much water. This product is available as Metamucil, Konsyl, Effersyllium, Per Diem Fiber, or the less expensive generic preparation in drug and health food stores. Although labeled a laxative, it really is not a laxative.  o Methylcellulose -- This is another fiber derived from wood which also retains water. It is available as Citrucel. o Polyethylene Glycol - and "artificial" fiber commonly called Miralax or Glycolax.  It is helpful for people with gassy or bloated feelings with regular fiber o Flax Seed - a less gassy fiber than psyllium   No reading or other relaxing activity while on the toilet. If bowel movements take longer than 5 minutes, you are too constipated   AVOID CONSTIPATION.  High fiber and water intake usually takes care of this.  Sometimes a laxative is needed to stimulate more frequent bowel movements, but    Laxatives are not a good long-term solution as it can wear the colon out. o Osmotics (Milk of Magnesia, Fleets phosphosoda, Magnesium citrate, MiraLax, GoLytely) are safer than  o Stimulants (Senokot, Castor Oil, Dulcolax,   Ex Lax)    o Do not take laxatives for more than 7days in a row.    IF SEVERELY CONSTIPATED, try a Bowel Retraining Program: o Do not use laxatives.  o Eat a diet high in roughage, such as bran cereals and leafy vegetables.  o Drink six (6) ounces of prune or apricot juice each morning.  o Eat two (2) large servings of stewed fruit each day.  o Take one (1) heaping tablespoon of a psyllium-based bulking agent twice a day. Use sugar-free sweetener when possible to avoid excessive calories.  o Eat a normal breakfast.  o Set aside 15 minutes after breakfast to sit on the toilet, but do not strain to have a bowel movement.  o If you do not have a bowel movement by the third day, use an enema and repeat the above steps.    Controlling diarrhea o Switch to liquids and  simpler foods for a few days to avoid stressing your intestines further. o Avoid dairy products (especially milk & ice cream) for a short time.  The intestines often can lose the ability to digest lactose when stressed. o Avoid foods that cause gassiness or bloating.  Typical foods include beans and other legumes, cabbage, broccoli, and dairy foods.  Every person has some sensitivity to other foods, so listen to our body and avoid those foods that trigger problems for you. o Adding fiber (Citrucel, Metamucil, psyllium, Miralax) gradually can help thicken stools by absorbing excess fluid and retrain the intestines to act more normally.  Slowly increase the dose over a few weeks.  Too much fiber too soon can backfire and cause cramping & bloating. o Probiotics (such as active yogurt, Align, etc) may help repopulate the intestines and colon with normal bacteria and calm down a sensitive digestive tract.  Most studies show it to be of mild help, though, and such products can be costly. o Medicines:   Bismuth subsalicylate (ex. Kayopectate, Pepto Bismol) every 30 minutes for up to 6 doses can help control diarrhea.  Avoid if pregnant.   Loperamide (Immodium) can slow down diarrhea.  Start with two tablets (4mg  total) first and then try one tablet every 6 hours.  Avoid if you are having fevers or severe pain.  If you are not better or start feeling worse, stop all medicines and call your doctor for advice o Call your doctor if you are getting worse or not better.  Sometimes further testing (cultures, endoscopy, X-ray studies, bloodwork, etc) may be needed to help diagnose and treat the cause of the diarrhea.  Constipation, Adult Constipation is when a person has fewer than 3 bowel movements a week; has difficulty having a bowel movement; or has stools that are dry, hard, or larger than normal. As people grow older, constipation is more common. If you try to fix constipation with medicines that make you have a  bowel movement (laxatives), the problem may get worse. Long-term laxative use may cause the muscles of the colon to become weak. A low-fiber diet, not taking in enough fluids, and taking certain medicines may make constipation worse. CAUSES   Certain medicines, such as antidepressants, pain medicine, iron supplements, antacids, and water pills.   Certain diseases, such as diabetes, irritable bowel syndrome (IBS), thyroid disease, or depression.   Not drinking enough water.   Not eating enough fiber-rich foods.   Stress or travel.  Lack of physical activity or exercise.  Not going to the restroom when there is the  urge to have a bowel movement.  Ignoring the urge to have a bowel movement.  Using laxatives too much. SYMPTOMS   Having fewer than 3 bowel movements a week.   Straining to have a bowel movement.   Having hard, dry, or larger than normal stools.   Feeling full or bloated.   Pain in the lower abdomen.  Not feeling relief after having a bowel movement. DIAGNOSIS  Your caregiver will take a medical history and perform a physical exam. Further testing may be done for severe constipation. Some tests may include:   A barium enema X-ray to examine your rectum, colon, and sometimes, your small intestine.  A sigmoidoscopy to examine your lower colon.  A colonoscopy to examine your entire colon. TREATMENT  Treatment will depend on the severity of your constipation and what is causing it. Some dietary treatments include drinking more fluids and eating more fiber-rich foods. Lifestyle treatments may include regular exercise. If these diet and lifestyle recommendations do not help, your caregiver may recommend taking over-the-counter laxative medicines to help you have bowel movements. Prescription medicines may be prescribed if over-the-counter medicines do not work.  HOME CARE INSTRUCTIONS   Increase dietary fiber in your diet, such as fruits, vegetables, whole  grains, and beans. Limit high-fat and processed sugars in your diet, such as [place] fries, hamburgers, cookies, candies, and soda.   A fiber supplement may be added to your diet if you cannot get enough fiber from foods.   Drink enough fluids to keep your urine clear or pale yellow.   Exercise regularly or as directed by your caregiver.   Go to the restroom when you have the urge to go. Do not hold it.  Only take medicines as directed by your caregiver. Do not take other medicines for constipation without talking to your caregiver first. SEEK IMMEDIATE MEDICAL CARE IF:   You have bright red blood in your stool.   Your constipation lasts for more than 4 days or gets worse.   You have abdominal or rectal pain.   You have thin, pencil-like stools.  You have unexplained weight loss. MAKE SURE YOU:   Understand these instructions.  Will watch your condition.  Will get help right away if you are not doing well or get worse. Document Released: 09/06/2004 Document Revised: 03/02/2012 Document Reviewed: 11/12/2011 [facility] Patient Information 2014 [place], [place].

## 2013-11-30 ENCOUNTER — Telehealth (INDEPENDENT_AMBULATORY_CARE_PROVIDER_SITE_OTHER): Payer: Self-pay

## 2013-11-30 NOTE — Telephone Encounter (Signed)
Returned pt's call after she spoke to the surgery schedulers today wanting surgery to be on the 12/29 by Dr Michaell Cowing. I advised pt that we would have to get surgical orders from Dr Michaell Cowing in order to schedule the surgery. The pt thought that she could just have surgery in the office on 12/29 when she is scheduled to see Dr Michaell Cowing for an office appt on that date. I explained that this type of surgery we don't do in the office. The pt was wanting to get the surgery done that week of the 12/29. I advised pt that Dr Michaell Cowing is not in the office this week so it would be next week before I get the surgical orders and that i doubt the surgery will be scheduled before the end of the year. I explained that the OR is running out out of time to schedule pt's for sx in the OR. I explained that Dr Michaell Cowing always tries to accomindate his pt's for the surgery date requested but at the end of the year it just doesn't always happen. The pt understands and still wants her appt on 12/20/13 with Dr Michaell Cowing but she wants surgical orders as well to be completed to get scheduled ASAP.

## 2013-12-06 ENCOUNTER — Other Ambulatory Visit (INDEPENDENT_AMBULATORY_CARE_PROVIDER_SITE_OTHER): Payer: Self-pay | Admitting: Surgery

## 2013-12-06 NOTE — Telephone Encounter (Signed)
I put in orders for [facility] hemorrhoidal ligation/pexy.  Please make sure copy of a [facility] flyer and rectal prep is given to the patient.  I did discuss this with her in the office as a backup plan.  If she wishes to discuss further, please let me know

## 2013-12-06 NOTE — Telephone Encounter (Signed)
Called Ann Watson back to notify her that Dr Michaell Cowing did complete surgical orders for her to get scheduled for sx. I will be mailing her a rectal prep to do the day before surgery. The Ann Watson still wants to keep the appt scheduled for 12/29 as well in case if she needs an injection before surgery.

## 2013-12-20 ENCOUNTER — Encounter (INDEPENDENT_AMBULATORY_CARE_PROVIDER_SITE_OTHER): Payer: Self-pay | Admitting: Surgery

## 2013-12-20 ENCOUNTER — Ambulatory Visit (INDEPENDENT_AMBULATORY_CARE_PROVIDER_SITE_OTHER): Payer: PRIVATE HEALTH INSURANCE | Admitting: Surgery

## 2013-12-20 VITALS — BP 122/82 | HR 72 | Temp 98.4°F | Resp 14 | Ht 64.0 in | Wt 234.2 lb

## 2013-12-20 DIAGNOSIS — K5909 Other constipation: Secondary | ICD-10-CM

## 2013-12-20 DIAGNOSIS — K5900 Constipation, unspecified: Secondary | ICD-10-CM

## 2013-12-20 DIAGNOSIS — K602 Anal fissure, unspecified: Secondary | ICD-10-CM | POA: Insufficient documentation

## 2013-12-20 DIAGNOSIS — K648 Other hemorrhoids: Secondary | ICD-10-CM

## 2013-12-20 MED ORDER — AMBULATORY NON FORMULARY MEDICATION: 1.0000 "application " | Freq: Four times a day (QID) | Status: DC

## 2013-12-20 NOTE — Patient Instructions (Signed)
WHAT IS AN ANAL FISSURE? An anal fissure (fissure-in-ano) is a small, oval shaped tear in skin that lines the opening of the anus. Fissures typically cause severe pain and bleeding with bowel movements. Fissures are quite common in the general population, but are often confused with other causes of pain and bleeding, such as hemorrhoids.  WHAT ARE THE SYMPTOMS OF AN ANAL FISSURE? The typical symptoms of an anal fissure include severe pain during, and especially after, a bowel movement, lasting from several minutes to a few hours. Patients may also notice bright red blood from the anus that can be seen on the toilet paper or on the stool. Between bowel movements, patients with anal fissures are often relatively symptom-free. Many patients are fearful of having a bowel movement and may try to avoid defecation secondary to the pain.   WHAT CAUSES AN ANAL FISSURE? Fissures are usually caused by trauma to the inner lining of the anus. Patients with tight anal sphincter muscles (i.e., increased muscle tone) are more prone to developing anal fissures. A hard, dry bowel movement is typically responsible, but loose stools and diarrhea can also be the cause. Following a bowel movement, severe anal pain can produce spasm of the anal sphincter muscle, resulting in a decrease in blood flow to the site of the injury, thus impairing healing of the wound. The next bowel movement results in more pain, anal spasm, decreased blood flow to the area, and the cycle continues. Treatments are aimed at interrupting this cycle by relaxing the anal sphincter muscle to promote healing of the fissure.   Other, less common, causes include inflammatory conditions and certain anal infections or tumors. Anal fissures may be acute (recent onset) or chronic (present for a long period of time). Chronic fissures may be more difficult to treat, and may also have an external lump associated with the tear, called a sentinel pile or skin tag, as  well as extra tissue just inside the anal canal (hypertrophied papilla) .  WHAT IS THE TREATMENT OF ANAL FISSURES? The majority of anal fissures do not require surgery. The most common treatment for an acute anal fissure consists of making the stool more formed and bulky with a diet high in fiber and utilization of over-the-counter fiber supplementation (totaling 25-35 grams of fiber/day). Stool softeners and increasing water intake may be necessary to promote soft bowel movements and aid in the healing process. Topical anesthetics for pain and warm tub baths (sitz baths) for 10-20 minutes several times a day (especially after bowel movements) are soothing and promote relaxation of the anal muscles, which may help the healing process.  Other medications (such as diltiazem) may be prescribed that allow relaxation of the anal sphincter muscles. Your surgeon will go over benefits and side-effects of each of these with you. Narcotic pain medications are not recommended for anal fissures, as they promote constipation. Chronic fissures are generally more difficult to treat, and your surgeon may advise surgical treatment.  WILL THE PROBLEM RETURN? Fissures can recur easily, and it is quite common for a fully healed fissure to recur after a hard bowel movement or other trauma. Even when the pain and bleeding have subsided, it is very important to continue good bowel habits and a diet high in fiber as a lifestyle change. If the problem returns without an obvious cause, further assessment is warranted.  GETTING TO GOOD BOWEL HEALTH. Irregular bowel habits such as constipation and diarrhea can lead to many problems over time.  Having one  soft bowel movement a day is the most important way to prevent further problems.  The anorectal canal is designed to handle stretching and feces to safely manage our ability to get rid of solid waste (feces, poop, stool) out of our body.  BUT, hard constipated stools can act like  ripping concrete bricks and diarrhea can be a burning fire to this very sensitive area of our body, causing inflamed hemorrhoids, anal fissures, increasing risk is perirectal abscesses, abdominal pain/bloating, an making irritable bowel worse.     The goal: ONE SOFT BOWEL MOVEMENT A DAY!  To have soft, regular bowel movements:    Drink at least 8 tall glasses of water a day.     Take plenty of fiber.  Fiber is the undigested part of plant food that passes into the colon, acting s "natures broom" to encourage bowel motility and movement.  Fiber can absorb and hold large amounts of water. This results in a larger, bulkier stool, which is soft and easier to pass. Work gradually over several weeks up to 6 servings a day of fiber (25g a day even more if needed) in the form of: o Vegetables -- Root (potatoes, carrots, turnips), leafy green (lettuce, salad greens, celery, spinach), or cooked high residue (cabbage, broccoli, etc) o Fruit -- Fresh (unpeeled skin & pulp), Dried (prunes, apricots, cherries, etc ),  or stewed ( applesauce)  o Whole grain breads, pasta, etc (whole wheat)  o Bran cereals    Bulking Agents -- This type of water-retaining fiber generally is easily obtained each day by one of the following:  o Psyllium bran -- The psyllium plant is remarkable because its ground seeds can retain so much water. This product is available as Metamucil, Konsyl, Effersyllium, Per Diem Fiber, or the less expensive generic preparation in drug and health food stores. Although labeled a laxative, it really is not a laxative.  o Methylcellulose -- This is another fiber derived from wood which also retains water. It is available as Citrucel. o Polyethylene Glycol - and "artificial" fiber commonly called Miralax or Glycolax.  It is helpful for people with gassy or bloated feelings with regular fiber o Flax Seed - a less gassy fiber than psyllium   No reading or other relaxing activity while on the toilet. If bowel  movements take longer than 5 minutes, you are too constipated   AVOID CONSTIPATION.  High fiber and water intake usually takes care of this.  Sometimes a laxative is needed to stimulate more frequent bowel movements, but    Laxatives are not a good long-term solution as it can wear the colon out. o Osmotics (Milk of Magnesia, Fleets phosphosoda, Magnesium citrate, MiraLax, GoLytely) are safer than  o Stimulants (Senokot, Castor Oil, Dulcolax, Ex Lax)    o Do not take laxatives for more than 7days in a row.    IF SEVERELY CONSTIPATED, try a Bowel Retraining Program: o Do not use laxatives.  o Eat a diet high in roughage, such as bran cereals and leafy vegetables.  o Drink six (6) ounces of prune or apricot juice each morning.  o Eat two (2) large servings of stewed fruit each day.  o Take one (1) heaping tablespoon of a psyllium-based bulking agent twice a day. Use sugar-free sweetener when possible to avoid excessive calories.  o Eat a normal breakfast.  o Set aside 15 minutes after breakfast to sit on the toilet, but do not strain to have a bowel movement.  o  If you do not have a bowel movement by the third day, use an enema and repeat the above steps.    Controlling diarrhea o Switch to liquids and simpler foods for a few days to avoid stressing your intestines further. o Avoid dairy products (especially milk & ice cream) for a short time.  The intestines often can lose the ability to digest lactose when stressed. o Avoid foods that cause gassiness or bloating.  Typical foods include beans and other legumes, cabbage, broccoli, and dairy foods.  Every person has some sensitivity to other foods, so listen to our body and avoid those foods that trigger problems for you. o Adding fiber (Citrucel, Metamucil, psyllium, Miralax) gradually can help thicken stools by absorbing excess fluid and retrain the intestines to act more normally.  Slowly increase the dose over a few weeks.  Too much fiber too  soon can backfire and cause cramping & bloating. o Probiotics (such as active yogurt, Align, etc) may help repopulate the intestines and colon with normal bacteria and calm down a sensitive digestive tract.  Most studies show it to be of mild help, though, and such products can be costly. o Medicines:   Bismuth subsalicylate (ex. Kayopectate, Pepto Bismol) every 30 minutes for up to 6 doses can help control diarrhea.  Avoid if pregnant.   Loperamide (Immodium) can slow down diarrhea.  Start with two tablets (4mg  total) first and then try one tablet every 6 hours.  Avoid if you are having fevers or severe pain.  If you are not better or start feeling worse, stop all medicines and call your doctor for advice o Call your doctor if you are getting worse or not better.  Sometimes further testing (cultures, endoscopy, X-ray studies, bloodwork, etc) may be needed to help diagnose and treat the cause of the diarrhea. o   WHAT CAN BE DONE IF THE FISSURE DOES NOT HEAL? A fissure that fails to respond to conservative measures should be re-examined. Persistent hard or loose bowel movements, scarring, or spasm of the internal anal muscle all contribute to delayed healing. Other medical problems such as inflammatory bowel disease (Crohn's disease), infections, or anal tumors can cause symptoms similar to anal fissures. Patients suffering from persistent anal pain should be examined to exclude these symptoms. This may include a colonoscopy or an exam in the operating room under anesthesia.  WHAT DOES SURGERY INVOLVE? Surgical options for treating anal fissure include Botulinum toxin (Botox) injection into the anal sphincter and surgical division of a portion of the internal anal sphincter (lateral internal sphincterotomy). Both of these are performed typically as outpatient, same-day procedures, or occasionally in the office setting. The goal of these surgical options is to promote relaxation of the anal sphincter,  thereby decreasing anal pain and spasm, allowing the fissure to heal. Botox injection results in healing in 50-80% of patients, while sphincterotomy is reported to be over 90% successful. If a sentinel pile is present, it may be removed to promote healing of the fissure. All surgical procedures carry some risk, and a sphincterotomy can rarely interfere with one's ability to control gas and stool. Your colon and rectal surgeon will discuss these risks with you to determine the appropriate treatment for your particular situation.  HOW LONG IS THE RECOVERY AFTER SURGERY? It is important to note that complete healing with both medical and surgical treatments can take up to approximately 6-10 weeks. However, acute pain after surgery often disappears after a few days. Most patients will  be able to return to work and resume daily activities in a few short days after the surgery.  CAN FISSURES LEAD TO COLON CANCER? Absolutely not. Persistent symptoms, however, need careful evaluation since other conditions other than an anal fissure can cause similar symptoms. Your colon and rectal surgeon may request additional tests, even if your fissure has successfully healed. A colonoscopy may be required to exclude other causes of rectal bleeding.  HEMORRHOIDS  The rectum is the last foot of your colon, and it naturally stretches to hold stool.  Hemorrhoidal piles are natural clusters of blood vessels that help the rectum and anal canal stretch to hold stool and allow bowel movements to eliminate feces.   Hemorrhoids are abnormally swollen blood vessels in the rectum.  Too much pressure in the rectum causes hemorrhoids by forcing blood to stretch and bulge the walls of the veins, sometimes even rupturing them.  Hemorrhoids can become like varicose veins you might see on a person's legs.  Most people will develop a flare of hemorrhoids in their lifetime.  When bulging hemorrhoidal veins are irritated, they can swell, burn,  itch, cause pain, and bleed.  Most flares will calm down gradually own within a few weeks.  However, once hemorrhoids are created, they are difficult to get rid of completely and tend to flare more easily than the first flare.   Fortunately, good habits and simple medical treatment usually control hemorrhoids well, and surgery is needed only in severe cases. Types of Hemorrhoids:  Internal hemorrhoids usually don't initially hurt or itch; they are deep inside the rectum and usually have no sensation. If they begin to push out (prolapse), pain and burning can occur.  However, internal hemorrhoids can bleed.  Anal bleeding should not be ignored since bleeding could come from a dangerous source like colorectal cancer, so persistent rectal bleeding should be investigated by a doctor, sometimes with a colonoscopy.  External hemorrhoids cause most of the symptoms - pain, burning, and itching. Nonirritated hemorrhoids can look like small skin tags coming out of the anus.   Thrombosed hemorrhoids can form when a hemorrhoid blood vessel bursts and causes the hemorrhoid to suddenly swell.  A purple blood clot can form in it and become an excruciatingly painful lump at the anus. Because of these unpleasant symptoms, immediate incision and drainage by a surgeon at an office visit can provide much relief of the pain.    PREVENTION Avoiding the most frequent causes listed below will prevent most cases of hemorrhoids: Constipation Hard stools Diarrhea  Constant sitting  Straining with bowel movements Sitting on the toilet for a long time  Severe coughing  episodes Pregnancy / Childbirth  Heavy Lifting  Sometimes avoiding the above triggers is difficult:  How can you avoid sitting all day if you have a seated job? Also, we try to avoid coughing and diarrhea, but sometimes it's beyond your control.  Still, there are some practical hints to help: Keep the anal and genital area clean.  Moistened tissues such as  flushable wet wipes are less irritating than toilet paper.  Using irrigating showers or bottle irrigation washing gently cleans this sensitive area.   Avoid dry toilet paper when cleaning after bowel movements.  Marland Kitchen Keep the anal and genital area dry.  Lightly pat the rectal area dry.  Avoid rubbing.  Talcum or baby powders can help GET YOUR STOOLS SOFT.   This is the most important way to prevent irritated hemorrhoids.  Hard stools are like  sandpaper to the anorectal canal and will cause more problems.  The goal: ONE SOFT BOWEL MOVEMENT A DAY!  BMs from every other day to 3 times a day is a tolerable range Treat coughing, diarrhea and constipation early since irritated hemorrhoids may soon follow.  If your main job activity is seated, always stand or walk during your breaks. Make it a point to stand and walk at least 5 minutes every hour and try to shift frequently in your chair to avoid direct rectal pressure.  Always exhale as you strain or lift. Don't hold your breath.  Do not delay or try to prevent a bowel movement when the urge is present. Exercise regularly (walking or jogging 60 minutes a day) to stimulate the bowels to move. No reading or other activity while on the toilet. If bowel movements take longer than 5 minutes, you are too constipated. AVOID CONSTIPATION Drink plenty of liquids (1 1/2 to 2 quarts of water and other fluids a day unless fluid restricted for another medical condition). Liquids that contain caffeine (coffee a, tea, soft drinks) can be dehydrating and should be avoided until constipation is controlled. Consider minimizing milk, as dairy products may be constipating. Eat plenty of fiber (30g a day ideal, more if needed).  Fiber is the undigested part of plant food that passes into the colon, acting as "natures broom" to encourage bowel motility and movement.  Fiber can absorb and hold large amounts of water. This results in a larger, bulkier stool, which is soft and easier to  pass.  Eating foods high in fiber - 12 servings - such as  Vegetables: Root (potatoes, carrots, turnips), Leafy green (lettuce, salad greens, celery, spinach), High residue (cabbage, broccoli, etc.) Fruit: Fresh, Dried (prunes, apricots, cherries), Stewed (applesauce)  Whole grain breads, pasta, whole wheat Bran cereals, muffins, etc. Consider adding supplemental bulking fiber which retains large volumes of water: Psyllium ground seeds (native plant from [place])--available as Metamucil, Konsyl, Effersyllium, Per Diem Fiber, or the less expensive generic forms.  Citrucel  (methylcellulose wood fiber) . FiberCon (Polycarbophil) Polyethylene Glycol - and "artificial" fiber commonly called Miralax or Glycolax.  It is helpful for people with gassy or bloated feelings with regular fiber Flax Seed - a less gassy natural fiber  Laxatives can be useful for a short period if constipation is severe Osmotics (Milk of Magnesia, Fleets Phospho-Soda, Magnesium Citrate)  Stimulants (Senokot,   Castor Oil,  Dulcolax, Ex-Lax)    Laxatives are not a good long-term solution as it can stress the bowels and cause too much mineral loss and dehydration.   Avoid taking laxatives for more than 7 days in a row.  AVOID DIARRHEA Switch to liquids and simpler foods for a few days to avoid stressing your intestines further. Avoid dairy products (especially milk & ice cream) for a short time.  The intestines often can lose the ability to digest lactose when stressed. Avoid foods that cause gassiness or bloating.  Typical foods include beans and other legumes, cabbage, broccoli, and dairy foods.  Every person has some sensitivity to other foods, so listen to your body and avoid those foods that trigger problems for you. Adding fiber (Citrucel, Metamucil, FiberCon, Flax seed, Miralax) gradually can help thicken stools by absorbing excess fluid and retrain the intestines to act more normally.  Slowly increase the dose over  a few weeks.  Too much fiber too soon can backfire and cause cramping & bloating. Probiotics (such as active yogurt, Align, etc) may  help repopulate the intestines and colon with normal bacteria and calm down a sensitive digestive tract.  Most studies show it to be of mild help, though, and such products can be costly. Medicines: Bismuth subsalicylate (ex. Kayopectate, Pepto Bismol) every 30 minutes for up to 6 doses can help control diarrhea.  Avoid if pregnant. Loperamide (Immodium) can slow down diarrhea.  Start with two tablets (4mg  total) first and then try one tablet every 6 hours.  Avoid if you are having fevers or severe pain.  If you are not better or start feeling worse, stop all medicines and call your doctor for advice Call your doctor if you are getting worse or not better.  Sometimes further testing (cultures, endoscopy, X-ray studies, bloodwork, etc) may be needed to help diagnose and treat the cause of the diarrhea. TREATMENT OF HEMORRHOID FLARE If these preventive measures fail, you must take action right away! Hemorrhoids are one condition that can be mild in the morning and become intolerable by nightfall. Most hemorrhoidal flares take several weeks to calm down.  These suggestions can help: Warm soaks.  This helps more than any topical medication.  Use up to 8 times a day.  Usually sitz baths or sitting in a warm bathtub helps.  Sitting on moist warm towels are helpful.  Switching to ice packs/cool compresses can be helpful  Use a Sitz Bath 4-8 times a day for relief A sitz bath is a warm water bath taken in the sitting position that covers only the hips and buttocks. It may be used for either healing or hygiene purposes. Sitz baths are also used to relieve pain, itching, or muscle spasms. The water may contain medicine. Moist heat will help you heal and relax.  HOME CARE INSTRUCTIONS  Take 3 to 4 sitz baths a day. 1. Fill the bathtub half full with warm water. 2. Sit in the water  and open the drain a little. 3. Turn on the warm water to keep the tub half full. Keep the water running constantly. 4. Soak in the water for 15 to 20 minutes. 5. After the sitz bath, pat the affected area dry first. SEEK MEDICAL CARE IF:  You get worse instead of better. Stop the sitz baths if you get worse.  Normalize your bowels.  Extremes of diarrhea or constipation will make hemorrhoids worse.  One soft bowel movement a day is the goal.  Fiber can help get your bowels regular Wet wipes instead of toilet paper Pain control with a NSAID such as ibuprofen (Advil) or naproxen (Aleve) or acetaminophen (Tylenol) around the clock.  Narcotics are constipating and should be minimized if possible Topical creams contain steroids (bydrocortisone) or local anesthetic (xylocaine) can help make pain and itching more tolerable.   EVALUATION If hemorrhoids are still causing problems, you could benefit by an evaluation by a surgeon.  The surgeon will obtain a history and examine you.  If hemorrhoids are diagnosed, some therapies can be offered in the office, usually with an anoscope into the less sensitive area of the rectum: -injection of hemorrhoids (sclerotherapy) can scar the blood vessels of the swollen/enlarged hemorrhoids to help shrink them down to a more normal size -rubber banding of the enlarged hemorrhoids to help shrink them down to a more normal size -drainage of the blood clot causing a thrombosed hemorrhoid,  to relieve the severe pain   While 90% of the time such problems from hemorrhoids can be managed without preceding to surgery, sometimes the hemorrhoids  require a operation to control the problem (uncontrolled bleeding, prolapse, pain, etc.).   This involves being placed under general anesthesia where the surgeon can confirm the diagnosis and remove, suture, or staple the hemorrhoid(s).  Your surgeon can help you treat the problem appropriately.    ANORECTAL SURGERY: POST OP  INSTRUCTIONS  1. Take your usually prescribed home medications unless otherwise directed. 2. DIET: Follow a light bland diet the first 24 hours after arrival home, such as soup, liquids, crackers, etc.  Be sure to include lots of fluids daily.  Avoid fast food or heavy meals as your are more likely to get nauseated.  Eat a low fat the next few days after surgery.   3. PAIN CONTROL: a. Pain is best controlled by a usual combination of three different methods TOGETHER: i. Ice/Heat ii. Over the counter pain medication iii. Prescription pain medication b. Most patients will experience some swelling and discomfort in the anus/rectal area. and incisions.  Ice packs or heat (30-60 minutes up to 6 times a day) will help. Use ice for the first few days to help decrease swelling and bruising, then switch to heat such as warm towels, sitz baths, warm baths, etc to help relax tight/sore spots and speed recovery.  Some people prefer to use ice alone, heat alone, alternating between ice & heat.  Experiment to what works for you.  Swelling and bruising can take several weeks to resolve.   c. It is helpful to take an over-the-counter pain medication regularly for the first few weeks.  Choose one of the following that works best for you: i. Naproxen (Aleve, etc)  Two 220mg  tabs twice a day ii. Ibuprofen (Advil, etc) Three 200mg  tabs four times a day (every meal & bedtime) iii. Acetaminophen (Tylenol, etc) 500-650mg  four times a day (every meal & bedtime) d. A  prescription for pain medication (such as oxycodone, hydrocodone, etc) should be given to you upon discharge.  Take your pain medication as prescribed.  i. If you are having problems/concerns with the prescription medicine (does not control pain, nausea, vomiting, rash, itching, etc), please call us [phone] to see if we need to switch you to a different pain medicine that will work better for you and/or control your side effect better. ii. If you need a  refill on your pain medication, please contact your pharmacy.  They will contact our office to request authorization. Prescriptions will not be filled after 5 pm or on week-ends.  Use a Sitz Bath 4-8 times a day for relief A sitz bath is a warm water bath taken in the sitting position that covers only the hips and buttocks. It may be used for either healing or hygiene purposes. Sitz baths are also used to relieve pain, itching, or muscle spasms. The water may contain medicine. Moist heat will help you heal and relax.  HOME CARE INSTRUCTIONS  Take 3 to 4 sitz baths a day. 6. Fill the bathtub half full with warm water. 7. Sit in the water and open the drain a little. 8. Turn on the warm water to keep the tub half full. Keep the water running constantly. 9. Soak in the water for 15 to 20 minutes. 10. After the sitz bath, pat the affected area dry first. SEEK MEDICAL CARE IF:  You get worse instead of better. Stop the sitz baths if you get worse.   4. KEEP YOUR BOWELS REGULAR a. The goal is one bowel movement a day b. Avoid  getting constipated.  Between the surgery and the pain medications, it is common to experience some constipation.  Increasing fluid intake and taking a fiber supplement (such as Metamucil, Citrucel, FiberCon, MiraLax, etc) 1-2 times a day regularly will usually help prevent this problem from occurring.  A mild laxative (prune juice, Milk of Magnesia, MiraLax, etc) should be taken according to package directions if there are no bowel movements after 48 hours. c. Watch out for diarrhea.  If you have many loose bowel movements, simplify your diet to bland foods & liquids for a few days.  Stop any stool softeners and decrease your fiber supplement.  Switching to mild anti-diarrheal medications (Kayopectate, Pepto Bismol) can help.  If this worsens or does not improve, please call us.  5. Wound Care a. Remove your bandages the day after surgery.  Unless discharge instructions indicate  otherwise, leave your bandage dry and in place overnight.  Remove the bandage during your first bowel movement.   b. Allow the wound packing to fall out over the next few days.  You can trim exposed gauze / ribbon as it falls out.  You do not need to repack the wound unless instructed otherwise.  Wear an absorbent pad or soft cotton gauze in your underwear as needed to catch any drainage and help keep the area  c. Keep the area clean and dry.  Bathe / shower every day.  Keep the area clean by showering / bathing over the incision / wound.   It is okay to soak an open wound to help wash it.  Wet wipes or showers / gentle washing after bowel movements is often less traumatic than regular toilet paper. d. Bonita Quin may have some styrofoam-like soft packing in the rectum which will come out with the first bowel movement.  e. You will often notice bleeding with bowel movements.  This should slow down by the end of the first week of surgery f. Expect some drainage.  This should slow down, too, by the end of the first week of surgery.  Wear an absorbent pad or soft cotton gauze in your underwear until the drainage stops. 6. ACTIVITIES as tolerated:   a. You may resume regular (light) daily activities beginning the next day-such as daily self-care, walking, climbing stairs-gradually increasing activities as tolerated.  If you can walk 30 minutes without difficulty, it is safe to try more intense activity such as jogging, treadmill, bicycling, low-impact aerobics, swimming, etc. b. Save the most intensive and strenuous activity for last such as sit-ups, heavy lifting, contact sports, etc  Refrain from any heavy lifting or straining until you are off narcotics for pain control.   c. DO NOT PUSH THROUGH PAIN.  Let pain be your guide: If it hurts to do something, don't do it.  Pain is your body warning you to avoid that activity for another week until the pain goes down. d. You may drive when you are no longer taking  prescription pain medication, you can comfortably sit for long periods of time, and you can safely maneuver your car and apply brakes. e. Bonita Quin may have sexual intercourse when it is comfortable.  7. FOLLOW UP in our office a. Please call [facility] at [phone] to set up an appointment to see your surgeon in the office for a follow-up appointment approximately 2 weeks after your surgery. b. Make sure that you call for this appointment the day you arrive home to insure a convenient appointment time. 10. IF YOU  HAVE DISABILITY OR FAMILY LEAVE FORMS, BRING THEM TO THE OFFICE FOR PROCESSING.  DO NOT GIVE THEM TO YOUR DOCTOR.        WHEN TO CALL us [phone]: 1. Poor pain control 2. Reactions / problems with new medications (rash/itching, nausea, etc)  3. Fever over 101.5 F (38.5 C) 4. Inability to urinate 5. Nausea and/or vomiting 6. Worsening swelling or bruising 7. Continued bleeding from incision. 8. Increased pain, redness, or drainage from the incision  The clinic staff is available to answer your questions during regular business hours (8:30am-5pm).  Please don't hesitate to call and ask to speak to one of our nurses for clinical concerns.   A surgeon from [facility] is always on call at the hospitals   If you have a medical emergency, go to the nearest emergency room or call 911.    [facility] [address], [address], [place], [place]  [address] ? MAIN: [phone] ? TOLL FREE: [phone] ? FAX [phone] [email or web address]

## 2013-12-20 NOTE — Progress Notes (Signed)
Subjective:     Patient ID: Ann Watson, female   DOB: 1971/09/08, 42 y.o.   MRN: [id number]  HPI   Ann Watson  1971/01/04 [id number]  Patient Care Team: Catha Gosselin, MD as PCP - General (Family Medicine)  This patient is a 42 y.o.female who presents today for surgical evaluation at the request of self.   Reason for visit: Hemorrhoids s/p Hem Bending x 3 piles 10/19/2013  Pleasant woman that is struggled with rectal pain for many years.  She is seen her group numerous times.  She has been offered surgery for hemorrhoids such had declined with flares improving.  She was seen two years ago with a fissure and hemorrhoids.  She notes the diltiazem cream helped that out.  It has gotten better this week but still sensitive.   She had another flare.  She went to [facility] to a holistic center Fall 2014 that provided treatment of her hemorrhoids with "low voltage".  Apparently, a probe was placed in her anal canal for 15 minutes.  Became exquisitely painful.  No incontinence to flatus or stool.  No severe bleeding.  Used up some old hydrocodone and suppositories.  It made her more constipated.  Eventually weaned her off the narcotics.   I performed hemorrhoidal banding Fall 2014.  She had recurrent problems.  I injected her last month.  She had recurrent symptoms.  She wished to consider hemorrhoid surgery.  She is scheduled for this in 2 days   She had had an episode of a painful bowel movement last week.  It is gradually gotten better but uncomfortable with BMs.  Trying to stay off narcotics.  Claims to have one bowel movement a day.  No fevers or chills.  She was scheduled for hemorrhoidal ligation/pexy in 2 days but wished to be seen beforehand just in case.  Patient Active Problem List   Diagnosis Date Noted  . Constipation, chronic 11/15/2013  . Internal hemorrhoids with prolapse/pain 08/07/2011    Past Medical History  Diagnosis Date  . Chronic kidney disease     kidney stone  extraction  . Fatigue   . Hemorrhoids   . External hemorrhoid, thrombosed 08/07/2011  . Obese   . Anal fissure 08/07/2011    Past Surgical History  Procedure Laterality Date  . Kidney stone surgery      History   Social History  . Marital Status: Married    Spouse Name: N/A    Number of Children: N/A  . Years of Education: N/A   Occupational History  . Not on file.   Social History Main Topics  . Smoking status: Former Smoker    Quit date: 08/10/2007  . Smokeless tobacco: Never Used  . Alcohol Use: Yes     Comment: wine - weekly  . Drug Use: No  . Sexual Activity: Not on file   Other Topics Concern  . Not on file   Social History Narrative  . No narrative on file    Family History  Problem Relation Age of Onset  . Diabetes Mother     Current Outpatient Prescriptions  Medication Sig Dispense Refill  . hydrocortisone (ANUSOL-HC) 2.5 % rectal cream Place rectally 2 (two) times daily. Apply around anus for irritated & painful hemorrhoids  15 g  2  . naproxen (NAPROSYN) 500 MG tablet Take 1 tablet (500 mg total) by mouth 2 (two) times daily with a meal.  40 tablet  1  . oxyCODONE (OXY IR/ROXICODONE) 5 MG immediate  release tablet Take 1-2 tablets (5-10 mg total) by mouth every 6 (six) hours as needed.  20 tablet  0   No current facility-administered medications for this visit.     No Known Allergies  BP 122/82  Pulse 72  Temp(Src) 98.4 F (36.9 C) (Temporal)  Resp 14  Ht 5\' 4"  (1.626 m)  Wt 234 lb 3.2 oz (106.232 kg)  BMI 40.18 kg/m2  No results found.   Review of Systems  Constitutional: Negative for fever, chills, diaphoresis, appetite change and fatigue.  HENT: Negative for ear discharge, ear pain, sore throat and trouble swallowing.   Eyes: Negative for photophobia, discharge and visual disturbance.  Respiratory: Negative for cough, choking, chest tightness and shortness of breath.   Cardiovascular: Negative for chest pain and palpitations.   Gastrointestinal: Positive for anal bleeding and rectal pain. Negative for nausea, vomiting, abdominal pain, diarrhea, constipation, blood in stool and abdominal distention.  Endocrine: Negative for cold intolerance and heat intolerance.  Genitourinary: Negative for dysuria, frequency and difficulty urinating.  Musculoskeletal: Negative for gait problem, myalgias and neck pain.  Skin: Negative for color change, pallor and rash.  Allergic/Immunologic: Negative for environmental allergies, food allergies and immunocompromised state.  Neurological: Negative for dizziness, speech difficulty, weakness and numbness.  Hematological: Negative for adenopathy.  Psychiatric/Behavioral: Negative for confusion and agitation. The patient is not nervous/anxious.        Objective:   Physical Exam  Constitutional: She is oriented to person, place, and time. She appears well-developed and well-nourished. No distress.  HENT:  Head: Normocephalic.  Mouth/Throat: Oropharynx is clear and moist. No oropharyngeal exudate.  Eyes: Conjunctivae and EOM are normal. Pupils are equal, round, and reactive to light. No scleral icterus.  Neck: Normal range of motion. Neck supple. No tracheal deviation present.  Cardiovascular: Normal rate, regular rhythm and intact distal pulses.   Pulmonary/Chest: Effort normal and breath sounds normal. No stridor. No respiratory distress. She exhibits no tenderness.  Abdominal: Soft. She exhibits no distension and no mass. There is no tenderness. Hernia confirmed negative in the right inguinal area and confirmed negative in the left inguinal area.  No hernias  Genitourinary: No vaginal discharge found.  Exam done with assistance of female Medical Assistant in the room.  Perianal skin clean with good hygiene.  No pruritis.  Mildly stretched/redundant left lateral anoderm but no distinct ext hemorrhoids   No pilonidal disease.  No abscess/fistula.    Posterior midline anal fissure.    Mildly increased sphincter tone.    Musculoskeletal: Normal range of motion. She exhibits no tenderness.       Right elbow: She exhibits normal range of motion.       Left elbow: She exhibits normal range of motion.       Right wrist: She exhibits normal range of motion.       Left wrist: She exhibits normal range of motion.       Right hand: Normal strength noted.       Left hand: Normal strength noted.  Lymphadenopathy:       Head (right side): No posterior auricular adenopathy present.       Head (left side): No posterior auricular adenopathy present.    She has no cervical adenopathy.    She has no axillary adenopathy.       Right: No inguinal adenopathy present.       Left: No inguinal adenopathy present.  Neurological: She is alert and oriented to person, place, and time.  No cranial nerve deficit. She exhibits normal muscle tone. Coordination normal.  Skin: Skin is warm and dry. No rash noted. She is not diaphoretic. No erythema.  Psychiatric: She has a normal mood and affect. Her behavior is normal. Judgment and thought content normal.       Assessment:     Recurrent posterior midline anal fissure  Enlarged internal hemorrhoids - somewhat improved after banding & injected    Plan:  I recommend to do another trial diltiazem cream to see if the fissure will heal.  I am concerned because it recurred that it will not she will require a lateral partial sphincterotomy, but I would like to get a chance for the fissure to heal first:  The anatomy & physiology of the anorectal region was discussed.  The pathophysiology of anal fissure and differential diagnosis was discussed.  Natural history progression  was discussed.   I stressed the importance of a bowel regimen to have daily soft bowel movements to minimize progression of disease.   I discussed the use of warm soaks &  muscle relaxant, diltiazem, to help the anal sphincter relax, allow the spasming to stop, and help the tear/fissure  to heal.  If non-operative treatment does not heal the fissure, I would recommend examination under anesthesia for better examination to confirm the diagnosis and treat by lateral internal sphincterotomy to allow the fissure to heal.  Technique, benefits, alternatives discussed.  Risks such as bleeding, pain, incontinence, recurrence, heart attack, death, and other risks were discussed.    Educational handouts further explaining the pathology, treatment options, and bowel regimen were given as well.  The patient expressed understanding & will follow up PRN.  If the pain does not resolve in a few weeks or worsens, he should proceed with surgery.  If the fissure healed up and she has no more symptoms, followup as needed.  If she has a persistent fissure or recurrent symptoms with a healed fissure, plan examination under anesthesia with THD roto-ligation/pexy and possible partial sphincterotomy:  The anatomy & physiology of the anorectal region was discussed.  The pathophysiology of hemorrhoids and differential diagnosis was discussed.  Natural history risks without surgery was discussed.   I stressed the importance of a bowel regimen to have daily soft bowel movements to minimize progression of disease.  Interventions such as sclerotherapy & banding were discussed.  The patient's symptoms are not adequately controlled by medicines and other non-operative treatments.  I feel the risks & problems of no surgery outweigh the operative risks; therefore, I recommended surgery to treat the hemorrhoids by ligation, pexy, and possible resection.  Risks such as bleeding, infection, urinary difficulties, need for further treatment, heart attack, death, and other risks were discussed.   I noted a good likelihood this will help address the problem.  Goals of post-operative recovery were discussed as well.  Possibility that this will not correct all symptoms was explained.  Post-operative pain, bleeding, constipation,  and other problems after surgery were discussed.  We will work to minimize complications.   Educational handouts further explaining the pathology, treatment options, and bowel regimen were given as well.  Questions were answered.  The patient expresses understanding & wishes to proceed with surgery.  The anatomy & physiology of the anorectal region was discussed.  The pathophysiology of anal fissure and differential diagnosis was discussed.  Natural history progression  was discussed.   I stressed the importance of a bowel regimen to have daily soft bowel movements to minimize  progression of disease.     The patient's condition is not adequately controlled.  Non-operative treatment has not healed the fissure.  Therefore, I recommended examination under anesthesia for better examination to confirm the diagnosis and treat by lateral internal sphincterotomy to relax the spasm better & allow the fissure to heal.  Technique, benefits, alternatives were discussed.   I noted a good likelihood this will help address the problem.  Risks such as bleeding, pain, incontinence, recurrence, heart attack, death, and other risks were discussed.    Educational handouts further explaining the pathology, treatment options, and bowel regimen were given as well.  The patient expressed understanding & wishes to proceed with surgery.  Get off narcotics.  That is exacerbating things.  Increase activity as tolerated to regular activity.  Low impact exercise such as walking an hour a day at least ideal.  Do not push through pain.  Diet as tolerated.  Low fat high fiber diet ideal.  Bowel regimen with 30 g fiber a day and fiber supplement as needed to avoid problems.  Switch to MiraLax or flaxseed to avoid the gassiness of the "gummi bear" fiber supplements.  Increase fiber supplementation to avoid further flares  Instructions discussed.  Followup with primary care physician for other health issues as would normally be done.  Questions answered.  The patient expressed understanding and appreciation.

## 2014-01-05 ENCOUNTER — Encounter (INDEPENDENT_AMBULATORY_CARE_PROVIDER_SITE_OTHER): Payer: PRIVATE HEALTH INSURANCE | Admitting: Surgery

## 2014-01-11 ENCOUNTER — Encounter (INDEPENDENT_AMBULATORY_CARE_PROVIDER_SITE_OTHER): Payer: PRIVATE HEALTH INSURANCE | Admitting: Surgery

## 2014-01-14 ENCOUNTER — Emergency Department (HOSPITAL_COMMUNITY): Payer: PRIVATE HEALTH INSURANCE

## 2014-01-14 ENCOUNTER — Observation Stay (HOSPITAL_COMMUNITY)
Admission: EM | Admit: 2014-01-14 | Discharge: 2014-01-16 | Disposition: A | Discharge status: Home / Self Care | Payer: PRIVATE HEALTH INSURANCE | Attending: Orthopedic Surgery | Admitting: Orthopedic Surgery

## 2014-01-14 ENCOUNTER — Observation Stay (HOSPITAL_COMMUNITY): Payer: PRIVATE HEALTH INSURANCE

## 2014-01-14 ENCOUNTER — Encounter (HOSPITAL_COMMUNITY): Payer: Self-pay | Admitting: Emergency Medicine

## 2014-01-14 DIAGNOSIS — Z87891 Personal history of nicotine dependence: Secondary | ICD-10-CM | POA: Insufficient documentation

## 2014-01-14 DIAGNOSIS — S82843A Displaced bimalleolar fracture of unspecified lower leg, initial encounter for closed fracture: Principal | ICD-10-CM | POA: Insufficient documentation

## 2014-01-14 DIAGNOSIS — I739 Peripheral vascular disease, unspecified: Secondary | ICD-10-CM | POA: Insufficient documentation

## 2014-01-14 DIAGNOSIS — N289 Disorder of kidney and ureter, unspecified: Secondary | ICD-10-CM | POA: Insufficient documentation

## 2014-01-14 DIAGNOSIS — W010XXA Fall on same level from slipping, tripping and stumbling without subsequent striking against object, initial encounter: Secondary | ICD-10-CM | POA: Insufficient documentation

## 2014-01-14 DIAGNOSIS — Z87442 Personal history of urinary calculi: Secondary | ICD-10-CM | POA: Insufficient documentation

## 2014-01-14 DIAGNOSIS — S82899A Other fracture of unspecified lower leg, initial encounter for closed fracture: Secondary | ICD-10-CM | POA: Diagnosis present

## 2014-01-14 DIAGNOSIS — S82841A Displaced bimalleolar fracture of right lower leg, initial encounter for closed fracture: Secondary | ICD-10-CM | POA: Diagnosis present

## 2014-01-14 LAB — BASIC METABOLIC PANEL
BUN: 14 mg/dL (ref 6–23)
CHLORIDE: 100 meq/L (ref 96–112)
CO2: 26 meq/L (ref 19–32)
CREATININE: 0.81 mg/dL (ref 0.50–1.10)
Calcium: 9.2 mg/dL (ref 8.4–10.5)
GFR calc Af Amer: >90 mL/min (ref >90)
GFR calc non Af Amer: 87 mL/min — ABNORMAL LOW (ref >90)
GLUCOSE: 144 mg/dL — AB (ref 70–99)
POTASSIUM: 4.2 meq/L (ref 3.7–5.3)
Sodium: 137 mEq/L (ref 137–147)

## 2014-01-14 LAB — CBC
HEMATOCRIT: 40 % (ref 36.0–46.0)
HEMOGLOBIN: 13.3 g/dL (ref 12.0–15.0)
MCH: 29 pg (ref 26.0–34.0)
MCHC: 33.3 g/dL (ref 30.0–36.0)
MCV: 87.3 fL (ref 78.0–100.0)
Platelets: 202 10*3/uL (ref 150–400)
RBC: 4.58 MIL/uL (ref 3.87–5.11)
RDW: 13.6 % (ref 11.5–15.5)
WBC: 19.3 10*3/uL — AB (ref 4.0–10.5)

## 2014-01-14 MED ORDER — HYDROMORPHONE HCL PF 1 MG/ML IJ SOLN
1.0000 mg | Freq: Once | INTRAMUSCULAR | Status: AC
  Administered 2014-01-14: 1 mg via INTRAVENOUS
  Filled 2014-01-14: qty 1

## 2014-01-14 MED ORDER — PROPOFOL 10 MG/ML IV BOLUS
0.5000 mg/kg | Freq: Once | INTRAVENOUS | Status: AC
  Administered 2014-01-14: 100 mg via INTRAVENOUS
  Filled 2014-01-14: qty 1

## 2014-01-14 MED ORDER — HYDROMORPHONE HCL PF 1 MG/ML IJ SOLN
0.5000 mg | Freq: Once | INTRAMUSCULAR | Status: AC
  Administered 2014-01-14: 0.5 mg via INTRAVENOUS
  Filled 2014-01-14: qty 1

## 2014-01-14 MED ORDER — HYDROMORPHONE HCL PF 1 MG/ML IJ SOLN
1.0000 mg | INTRAMUSCULAR | Status: AC
  Administered 2014-01-14: 1 mg via INTRAMUSCULAR
  Filled 2014-01-14: qty 1

## 2014-01-14 NOTE — ED Notes (Signed)
Per EMS, pt was outside on her deck and she slipped and fell. Rt ankle is externally rotated with obvious deformity and swelling.  Splinted by EMS. Ice applied enroute. Passed EMS spinal assessment. 200 mcg Fentanyl given intranasal in transport.

## 2014-01-14 NOTE — ED Notes (Signed)
Patient transported to X-ray 

## 2014-01-14 NOTE — ED Notes (Signed)
Bed: HF02 Expected date: 01/14/14 Expected time: 7:29 PM Means of arrival: Ambulance Comments: Bed 23, EMS, 74 F, Fall

## 2014-01-14 NOTE — ED Provider Notes (Signed)
CSN: [id number]     Arrival date & time 01/14/14  1934 History   First MD Initiated Contact with Patient 01/14/14 1944     Chief Complaint  Patient presents with  . Fall   (Consider location/radiation/quality/duration/timing/severity/associated sxs/prior Treatment) Patient is a 43 y.o. female presenting with fall. The history is provided by the patient.  Fall This is a new problem. The current episode started less than 1 hour ago. Episode frequency: once. The problem has been resolved. Pertinent negatives include no chest pain, no abdominal pain, no headaches and no shortness of breath. Nothing aggravates the symptoms. Nothing relieves the symptoms. Treatments tried: fentanyl. The treatment provided mild relief.    Past Medical History  Diagnosis Date  . Chronic kidney disease     kidney stone extraction  . Fatigue   . Hemorrhoids   . External hemorrhoid, thrombosed 08/07/2011  . Obese   . Anal fissure 08/07/2011   Past Surgical History  Procedure Laterality Date  . Kidney stone surgery     Family History  Problem Relation Age of Onset  . Diabetes Mother    History  Substance Use Topics  . Smoking status: Former Smoker    Quit date: 08/10/2007  . Smokeless tobacco: Never Used  . Alcohol Use: Yes     Comment: wine - weekly   OB History   Grav Para Term Preterm Abortions TAB SAB Ect Mult Living                 Review of Systems  Constitutional: Negative for fever and fatigue.  HENT: Negative for congestion and drooling.   Eyes: Negative for pain.  Respiratory: Negative for cough and shortness of breath.   Cardiovascular: Negative for chest pain.  Gastrointestinal: Negative for nausea, vomiting, abdominal pain and diarrhea.  Genitourinary: Negative for dysuria and hematuria.  Musculoskeletal: Negative for back pain, gait problem and neck pain.  Skin: Negative for color change.  Neurological: Negative for dizziness and headaches.  Hematological: Negative for  adenopathy.  Psychiatric/Behavioral: Negative for behavioral problems.  All other systems reviewed and are negative.    Allergies  Review of patient's allergies indicates no known allergies.  Home Medications   Current Outpatient Rx  Name  Route  Sig  Dispense  Refill  . AMBULATORY NON FORMULARY MEDICATION   Rectal   Place 1 application rectally 4 (four) times daily. Diltiazem 2% compounded suspension.   1 Tube   2     Apply around anus for 3 weeks.  Call surgeon if no ...   . hydrocortisone (ANUSOL-HC) 2.5 % rectal cream   Rectal   Place rectally 2 (two) times daily. Apply around anus for irritated & painful hemorrhoids   15 g   2   . naproxen (NAPROSYN) 500 MG tablet   Oral   Take 1 tablet (500 mg total) by mouth 2 (two) times daily with a meal.   40 tablet   1   . oxyCODONE (OXY IR/ROXICODONE) 5 MG immediate release tablet   Oral   Take 1-2 tablets (5-10 mg total) by mouth every 6 (six) hours as needed.   20 tablet   0    BP 124/86  Pulse 79  Temp(Src) 99 F (37.2 C) (Oral)  Resp 24  Ht 5\' 4"  (1.626 m)  Wt 240 lb (108.863 kg)  BMI 41.18 kg/m2  SpO2 95%  LMP 12/23/2013 Physical Exam  Nursing note and vitals reviewed. Constitutional: She is oriented to person, place, and  time. She appears well-developed and well-nourished.  HENT:  Head: Normocephalic.  Mouth/Throat: Oropharynx is clear and moist. No oropharyngeal exudate.  Eyes: Conjunctivae and EOM are normal. Pupils are equal, round, and reactive to light.  Neck: Normal range of motion. Neck supple.  No vertebral tenderness to palpation.  Cardiovascular: Normal rate, regular rhythm, normal heart sounds and intact distal pulses.  Exam reveals no gallop and no friction rub.   No murmur heard. Pulmonary/Chest: Effort normal and breath sounds normal. No respiratory distress. She has no wheezes.  Abdominal: Soft. Bowel sounds are normal. There is no tenderness. There is no rebound and no guarding.   Musculoskeletal: Normal range of motion. She exhibits no edema and no tenderness.  Gross deformity to right ankle. Sensation is intact in the right lower extremity. The patient has normal movement of the digits of her right foot. She has 2+ distal pulses in bilateral lower extremities.  Neurological: She is alert and oriented to person, place, and time.  Skin: Skin is warm and dry.  Psychiatric: She has a normal mood and affect. Her behavior is normal.    ED Course  Reduction of dislocation Date/Time: 01/14/2014 11:19 PM Performed by: Pamella Pert, S Authorized by: Pamella Pert, S Consent: Verbal consent obtained. written consent obtained. Risks and benefits: risks, benefits and alternatives were discussed Consent given by: patient Patient understanding: patient states understanding of the procedure being performed Patient consent: the patient's understanding of the procedure matches consent given Procedure consent: procedure consent matches procedure scheduled Relevant documents: relevant documents present and verified Test results: test results available and properly labeled Site marked: the operative site was marked Imaging studies: imaging studies available Required items: required blood products, implants, devices, and special equipment available Patient identity confirmed: verbally with patient, arm band, provided demographic data and hospital-assigned identification number Time out: Immediately prior to procedure a "time out" was called to verify the correct patient, procedure, equipment, support staff and site/side marked as required. Preparation: Patient was prepped and draped in the usual sterile fashion. Local anesthesia used: no Patient sedated: yes Sedation type: moderate (conscious) sedation Sedatives: propofol Sedation start date/time: 01/14/2014 10:29 PM Sedation end date/time: 01/14/2014 10:40 PM Vitals: Vital signs were monitored during sedation. Patient  tolerance: Patient tolerated the procedure well with no immediate complications. Comments: I reexamined the patient after reduction of the right ankle. She has 2+ distal pulses in the right lower extremity. Sensation remains intact in the right lower extremity. She is able to move the digits in her right lower extremity.   (including critical care time) Labs Review Labs Reviewed  CBC - Abnormal; Notable for the following:    WBC 19.3 (*)    All other components within normal limits  BASIC METABOLIC PANEL - Abnormal; Notable for the following:    Glucose, Bld 144 (*)    GFR calc non Af Amer 87 (*)    All other components within normal limits   Imaging Review Dg Tibia/fibula Right  01/14/2014   CLINICAL DATA:  Fall.  Right leg injury and pain.  EXAM: RIGHT TIBIA AND FIBULA - 2 VIEW  COMPARISON:  None.  FINDINGS: Bimalleolar ankle fracture seen with lateral dislocation of the talus. No proximal tibial or fibular fractures are identified.  IMPRESSION: Bimalleolar ankle fracture -dislocation. No proximal tibial or fibular fractures identified.   Electronically Signed   By: Earle Gell M.D.   On: 01/14/2014 20:49   Dg Ankle Complete Right  01/14/2014   CLINICAL DATA:  Fall.  Ankle pain.  Ankle fracture deformity.  EXAM: RIGHT ANKLE - COMPLETE 3+ VIEW  COMPARISON:  None.  FINDINGS: Displaced fractures are seen through the medial malleolus and distal tibia just above the level of the tibial plafond. Lateral dislocation the talus is seen.  IMPRESSION: Bimalleolar ankle fracture with lateral dislocation of the talus.   Electronically Signed   By: Earle Gell M.D.   On: 01/14/2014 20:47   Dg Foot Complete Right  01/14/2014   CLINICAL DATA:  Slip and fall, right foot pain and deformity  EXAM: RIGHT FOOT COMPLETE - 3+ VIEW  COMPARISON:  Please see ankle radiographs obtained same date, dictated separately  FINDINGS: Suboptimal visualization due to positioning and splint material. Two view technique only was  obtained. Right ankle fracture dislocation incompletely imaged. Possible linear radiopaque foreign body adjacent to the proximal phalanx of the great toe. Metatarsals are unremarkable.  IMPRESSION: Right ankle fracture-dislocation partly visualized, please see dedicated report dictated separately.  Linear radiopaque foreign body adjacent to the proximal phalanx of the great toe which could be the tip of a needle.  No gross evidence for displaced foot fracture.   Electronically Signed   By: Conchita Paris M.D.   On: 01/14/2014 20:54    MDM   1. Displaced bimalleolar fracture of right ankle    7:57 PM 43 y.o. female who presents with a mechanical fall and right ankle pain. The patient states that she slipped on the last stair going down her porch. Her weight was placed on her right ankle. She denies hitting her head or loss of consciousness. She has a wrist deformity to her right ankle. Sensation and distal motor skills are intact in the right foot. She has 2+ distal pulses in the right lower extremity. Will get pain control and imaging.  Plain film w/ dislocated bimal fx of right ankle. I discussed w/ on call ortho. Will reduce in ED under conscious sedation. Pt consented and tolerated the procedure very well. Will admit w/ plan to go to the OR in the morning. Post reduction films evaluated by me and post procedure exam performed to ensure pt remained neurovascularly intact.     Blanchard Kelch, MD 01/15/14 1135

## 2014-01-15 ENCOUNTER — Observation Stay (HOSPITAL_COMMUNITY): Payer: PRIVATE HEALTH INSURANCE | Admitting: Anesthesiology

## 2014-01-15 ENCOUNTER — Encounter (HOSPITAL_COMMUNITY)
Admission: EM | Disposition: A | Discharge status: Home / Self Care | Payer: Self-pay | Source: Home / Self Care | Attending: Emergency Medicine

## 2014-01-15 ENCOUNTER — Observation Stay (HOSPITAL_COMMUNITY): Payer: PRIVATE HEALTH INSURANCE

## 2014-01-15 ENCOUNTER — Other Ambulatory Visit: Payer: Self-pay

## 2014-01-15 ENCOUNTER — Encounter (HOSPITAL_COMMUNITY): Payer: Self-pay | Admitting: Orthopedic Surgery

## 2014-01-15 ENCOUNTER — Encounter (HOSPITAL_COMMUNITY): Payer: PRIVATE HEALTH INSURANCE | Admitting: Anesthesiology

## 2014-01-15 DIAGNOSIS — S82899A Other fracture of unspecified lower leg, initial encounter for closed fracture: Secondary | ICD-10-CM | POA: Diagnosis present

## 2014-01-15 HISTORY — PX: ORIF ANKLE FRACTURE: SHX5408

## 2014-01-15 LAB — SURGICAL PCR SCREEN
MRSA, PCR: NEGATIVE
Staphylococcus aureus: NEGATIVE

## 2014-01-15 SURGERY — OPEN REDUCTION INTERNAL FIXATION (ORIF) ANKLE FRACTURE
Anesthesia: General | Site: Ankle | Laterality: Right

## 2014-01-15 MED ORDER — METHOCARBAMOL 100 MG/ML IJ SOLN
500.0000 mg | Freq: Four times a day (QID) | INTRAVENOUS | Status: DC | PRN
  Filled 2014-01-15: qty 5

## 2014-01-15 MED ORDER — FENTANYL CITRATE 0.05 MG/ML IJ SOLN
INTRAMUSCULAR | Status: AC
  Filled 2014-01-15: qty 5

## 2014-01-15 MED ORDER — HYDROMORPHONE HCL PF 1 MG/ML IJ SOLN
0.5000 mg | INTRAMUSCULAR | Status: DC | PRN
  Administered 2014-01-15 (×3): 1 mg via INTRAVENOUS
  Filled 2014-01-15 (×2): qty 1

## 2014-01-15 MED ORDER — MEPERIDINE HCL 25 MG/ML IJ SOLN: 6.2500 mg | INTRAMUSCULAR | Status: DC | PRN

## 2014-01-15 MED ORDER — METOCLOPRAMIDE HCL 10 MG PO TABS: 5.0000 mg | ORAL_TABLET | Freq: Three times a day (TID) | ORAL | Status: DC | PRN

## 2014-01-15 MED ORDER — HYDROMORPHONE HCL PF 1 MG/ML IJ SOLN
1.0000 mg | INTRAMUSCULAR | Status: DC | PRN
  Administered 2014-01-15 (×5): 1 mg via INTRAVENOUS
  Filled 2014-01-15 (×6): qty 1

## 2014-01-15 MED ORDER — DEXAMETHASONE SODIUM PHOSPHATE 10 MG/ML IJ SOLN
INTRAMUSCULAR | Status: DC | PRN
  Administered 2014-01-15: 10 mg via INTRAVENOUS

## 2014-01-15 MED ORDER — METHOCARBAMOL 500 MG PO TABS
500.0000 mg | ORAL_TABLET | Freq: Four times a day (QID) | ORAL | Status: DC | PRN
  Administered 2014-01-15 – 2014-01-16 (×2): 500 mg via ORAL
  Filled 2014-01-15 (×2): qty 1

## 2014-01-15 MED ORDER — SODIUM CHLORIDE 0.9 % IV SOLN
INTRAVENOUS | Status: AC
  Administered 2014-01-15: via INTRAVENOUS

## 2014-01-15 MED ORDER — DOCUSATE SODIUM 100 MG PO CAPS
100.0000 mg | ORAL_CAPSULE | Freq: Every day | ORAL | Status: DC
  Administered 2014-01-15 – 2014-01-16 (×2): 100 mg via ORAL

## 2014-01-15 MED ORDER — WARFARIN SODIUM 5 MG PO TABS: 5.0000 mg | ORAL_TABLET | Freq: Every day | ORAL | Status: DC

## 2014-01-15 MED ORDER — 0.9 % SODIUM CHLORIDE (POUR BTL) OPTIME
TOPICAL | Status: DC | PRN
  Administered 2014-01-15 (×2): 1000 mL

## 2014-01-15 MED ORDER — FENTANYL CITRATE 0.05 MG/ML IJ SOLN
INTRAMUSCULAR | Status: DC | PRN
Start: 2014-01-15 — End: 2014-01-15
  Administered 2014-01-15: 50 ug via INTRAVENOUS

## 2014-01-15 MED ORDER — PROMETHAZINE HCL 25 MG/ML IJ SOLN: 6.2500 mg | INTRAMUSCULAR | Status: DC | PRN

## 2014-01-15 MED ORDER — ONDANSETRON HCL 4 MG/2ML IJ SOLN
INTRAMUSCULAR | Status: AC
  Filled 2014-01-15: qty 2

## 2014-01-15 MED ORDER — PROPOFOL 10 MG/ML IV BOLUS
INTRAVENOUS | Status: AC
  Filled 2014-01-15: qty 20

## 2014-01-15 MED ORDER — ONDANSETRON HCL 4 MG PO TABS: 4.0000 mg | ORAL_TABLET | Freq: Four times a day (QID) | ORAL | Status: DC | PRN

## 2014-01-15 MED ORDER — MIDAZOLAM HCL 2 MG/2ML IJ SOLN: 0.5000 mg | Freq: Once | INTRAMUSCULAR | Status: AC | PRN

## 2014-01-15 MED ORDER — METHOCARBAMOL 500 MG PO TABS: 500.0000 mg | ORAL_TABLET | Freq: Four times a day (QID) | ORAL | Status: DC | PRN

## 2014-01-15 MED ORDER — OXYCODONE-ACETAMINOPHEN 10-325 MG PO TABS: 1.0000 | ORAL_TABLET | Freq: Four times a day (QID) | ORAL | Status: DC | PRN

## 2014-01-15 MED ORDER — WARFARIN VIDEO: Freq: Once | Status: DC

## 2014-01-15 MED ORDER — CEFAZOLIN SODIUM-DEXTROSE 2-3 GM-% IV SOLR
INTRAVENOUS | Status: AC
  Filled 2014-01-15: qty 50

## 2014-01-15 MED ORDER — BUPIVACAINE HCL (PF) 0.5 % IJ SOLN
INTRAMUSCULAR | Status: AC
  Filled 2014-01-15: qty 30

## 2014-01-15 MED ORDER — PATIENT'S GUIDE TO USING COUMADIN BOOK
Freq: Once | Status: AC
  Administered 2014-01-15: 1
  Filled 2014-01-15: qty 1

## 2014-01-15 MED ORDER — CEFAZOLIN SODIUM-DEXTROSE 2-3 GM-% IV SOLR
2.0000 g | Freq: Once | INTRAVENOUS | Status: AC
  Administered 2014-01-15: 2 g via INTRAVENOUS

## 2014-01-15 MED ORDER — LIDOCAINE HCL (CARDIAC) 20 MG/ML IV SOLN
INTRAVENOUS | Status: DC | PRN
  Administered 2014-01-15: 100 mg via INTRAVENOUS

## 2014-01-15 MED ORDER — LIDOCAINE HCL (CARDIAC) 20 MG/ML IV SOLN
INTRAVENOUS | Status: AC
  Filled 2014-01-15: qty 5

## 2014-01-15 MED ORDER — POTASSIUM CHLORIDE IN NACL 20-0.9 MEQ/L-% IV SOLN
INTRAVENOUS | Status: AC
  Administered 2014-01-15: 12:00:00 via INTRAVENOUS
  Filled 2014-01-15 (×2): qty 1000

## 2014-01-15 MED ORDER — LACTATED RINGERS IV SOLN
INTRAVENOUS | Status: DC | PRN
  Administered 2014-01-15: 08:00:00 via INTRAVENOUS

## 2014-01-15 MED ORDER — HYDROMORPHONE HCL PF 1 MG/ML IJ SOLN
INTRAMUSCULAR | Status: AC
  Filled 2014-01-15: qty 1

## 2014-01-15 MED ORDER — DEXAMETHASONE SODIUM PHOSPHATE 10 MG/ML IJ SOLN
INTRAMUSCULAR | Status: AC
  Filled 2014-01-15: qty 1

## 2014-01-15 MED ORDER — PROPOFOL 10 MG/ML IV BOLUS
INTRAVENOUS | Status: DC | PRN
  Administered 2014-01-15: 150 mg via INTRAVENOUS

## 2014-01-15 MED ORDER — OXYCODONE HCL 5 MG PO TABS
5.0000 mg | ORAL_TABLET | ORAL | Status: DC | PRN
  Administered 2014-01-16: 10 mg via ORAL
  Administered 2014-01-16: 5 mg via ORAL
  Administered 2014-01-16: 10 mg via ORAL
  Filled 2014-01-15: qty 2
  Filled 2014-01-15: qty 1
  Filled 2014-01-15: qty 2

## 2014-01-15 MED ORDER — ONDANSETRON HCL 4 MG/2ML IJ SOLN
INTRAMUSCULAR | Status: DC | PRN
  Administered 2014-01-15: 4 mg via INTRAVENOUS

## 2014-01-15 MED ORDER — CEFAZOLIN SODIUM-DEXTROSE 2-3 GM-% IV SOLR
2.0000 g | Freq: Three times a day (TID) | INTRAVENOUS | Status: AC
  Administered 2014-01-15 (×2): 2 g via INTRAVENOUS
  Filled 2014-01-15 (×2): qty 50

## 2014-01-15 MED ORDER — HYDROMORPHONE HCL PF 1 MG/ML IJ SOLN: 0.2500 mg | INTRAMUSCULAR | Status: DC | PRN

## 2014-01-15 MED ORDER — WARFARIN SODIUM 7.5 MG PO TABS
7.5000 mg | ORAL_TABLET | Freq: Once | ORAL | Status: AC
  Administered 2014-01-15: 7.5 mg via ORAL
  Filled 2014-01-15: qty 1

## 2014-01-15 MED ORDER — POTASSIUM CHLORIDE IN NACL 20-0.9 MEQ/L-% IV SOLN
INTRAVENOUS | Status: AC
  Filled 2014-01-15: qty 1000

## 2014-01-15 MED ORDER — MIDAZOLAM HCL 2 MG/2ML IJ SOLN
INTRAMUSCULAR | Status: AC
  Filled 2014-01-15: qty 2

## 2014-01-15 MED ORDER — WARFARIN - PHARMACIST DOSING INPATIENT: Freq: Every day | Status: DC

## 2014-01-15 MED ORDER — ONDANSETRON HCL 4 MG/2ML IJ SOLN
4.0000 mg | Freq: Four times a day (QID) | INTRAMUSCULAR | Status: DC | PRN
  Filled 2014-01-15: qty 2

## 2014-01-15 MED ORDER — METOCLOPRAMIDE HCL 5 MG/ML IJ SOLN: 5.0000 mg | Freq: Three times a day (TID) | INTRAMUSCULAR | Status: DC | PRN

## 2014-01-15 MED ORDER — MIDAZOLAM HCL 5 MG/5ML IJ SOLN
INTRAMUSCULAR | Status: DC | PRN
  Administered 2014-01-15: 2 mg via INTRAVENOUS

## 2014-01-15 SURGICAL SUPPLY — 55 items
BAG ZIPLOCK 12X15 (MISCELLANEOUS) ×3 IMPLANT
BANDAGE ELASTIC 4 VELCRO ST LF (GAUZE/BANDAGES/DRESSINGS) ×3 IMPLANT
BANDAGE ELASTIC 6 VELCRO ST LF (GAUZE/BANDAGES/DRESSINGS) ×3 IMPLANT
BIT DRILL 2.7 QC CANN 155 (BIT) ×2 IMPLANT
BIT DRILL 2.7 QC CANN 155MM (BIT) ×1
BIT DRILL 3.5 QC 155 (BIT) ×2 IMPLANT
BIT DRILL 3.5 QC 155MM (BIT) ×1
BIT DRILL QC 2.7 6.3IN  SHORT (BIT) ×2
BIT DRILL QC 2.7 6.3IN SHORT (BIT) ×1 IMPLANT
BNDG ESMARK 4X9 LF (GAUZE/BANDAGES/DRESSINGS) ×3 IMPLANT
COVER SURGICAL LIGHT HANDLE (MISCELLANEOUS) ×3 IMPLANT
CUFF TOURN SGL QUICK 34 (TOURNIQUET CUFF) ×2
CUFF TOURN SGL QUICK 44 (TOURNIQUET CUFF) ×3 IMPLANT
CUFF TRNQT CYL 34X4X40X1 (TOURNIQUET CUFF) ×1 IMPLANT
DECANTER SPIKE VIAL GLASS SM (MISCELLANEOUS) IMPLANT
DRAPE C-ARM 42X120 X-RAY (DRAPES) ×3 IMPLANT
DRAPE LG THREE QUARTER DISP (DRAPES) ×3 IMPLANT
DRAPE SURG 17X11 SM STRL (DRAPES) ×3 IMPLANT
DRAPE U-SHAPE 47X51 STRL (DRAPES) ×6 IMPLANT
ELECT REM PT RETURN 9FT ADLT (ELECTROSURGICAL) ×3
ELECTRODE REM PT RTRN 9FT ADLT (ELECTROSURGICAL) ×1 IMPLANT
GAUZE XEROFORM 5X9 LF (GAUZE/BANDAGES/DRESSINGS) ×3 IMPLANT
GLOVE SURG ORTHO 8.0 STRL STRW (GLOVE) ×3 IMPLANT
GOWN STRL REUS W/TWL LRG LVL3 (GOWN DISPOSABLE) ×3 IMPLANT
GUIDE PIN 1.3 (Pin) ×6 IMPLANT
NEEDLE HYPO 22GX1.5 SAFETY (NEEDLE) ×3 IMPLANT
NS IRRIG 1000ML POUR BTL (IV SOLUTION) ×6 IMPLANT
PACK LOWER EXTREMITY WL (CUSTOM PROCEDURE TRAY) ×3 IMPLANT
PAD ABD 8X10 STRL (GAUZE/BANDAGES/DRESSINGS) ×15 IMPLANT
PAD CAST 4YDX4 CTTN HI CHSV (CAST SUPPLIES) ×1 IMPLANT
PADDING CAST COTTON 4X4 STRL (CAST SUPPLIES) ×2
PADDING CAST COTTON 6X4 STRL (CAST SUPPLIES) ×3 IMPLANT
PIN GUIDE 1.3 (Pin) ×2 IMPLANT
PLATE FIBULA 5 HOLE (Plate) ×3 IMPLANT
POSITIONER SURGICAL ARM (MISCELLANEOUS) ×3 IMPLANT
SCREW CANN 4.0X44 (Screw) ×2 IMPLANT
SCREW CANN PT STD 4X44 (Screw) ×1 IMPLANT
SCREW CANNULATED 4.1X40 (Screw) ×3 IMPLANT
SCREW LOCK 12X3.5XST PRLC (Screw) ×1 IMPLANT
SCREW LOCK 3.5X12 (Screw) ×2 IMPLANT
SCREW LOCK 3.5X14 (Screw) ×6 IMPLANT
SCREW NON LOCK 3.5X12 (Screw) ×6 IMPLANT
SCREW NON LOCK 3.5X20 (Screw) ×3 IMPLANT
SCREW NONLOCK 3.5X10 (Screw) ×6 IMPLANT
SPLINT PLASTER CAST XFAST 5X30 (CAST SUPPLIES) ×2 IMPLANT
SPLINT PLASTER XFAST SET 5X30 (CAST SUPPLIES) ×4
SPONGE GAUZE 4X4 12PLY (GAUZE/BANDAGES/DRESSINGS) ×6 IMPLANT
STOCKINETTE 8 INCH (MISCELLANEOUS) ×3 IMPLANT
SUT ETHILON 3 0 PS 1 (SUTURE) ×21 IMPLANT
SUT ETHILON 4 0 PS 2 18 (SUTURE) ×6 IMPLANT
SUT VIC AB 2-0 CT1 27 (SUTURE) ×6
SUT VIC AB 2-0 CT1 TAPERPNT 27 (SUTURE) ×3 IMPLANT
SYR CONTROL 10ML LL (SYRINGE) ×3 IMPLANT
TOWEL OR 17X26 10 PK STRL BLUE (TOWEL DISPOSABLE) ×6 IMPLANT
WATER STERILE IRR 1500ML POUR (IV SOLUTION) ×3 IMPLANT

## 2014-01-15 NOTE — Anesthesia Preprocedure Evaluation (Addendum)
Anesthesia Evaluation  Patient identified by MRN, date of birth, ID band Patient awake    Reviewed: Allergy & Precautions, H&P , Patient's Chart, lab work & pertinent test results, reviewed documented beta blocker date and time   History of Anesthesia Complications Negative for: history of anesthetic complications  Airway Mallampati: III TM Distance: >3 FB Neck ROM: full    Dental   Pulmonary former smoker,  breath sounds clear to auscultation        Cardiovascular Exercise Tolerance: Good + Peripheral Vascular Disease Rhythm:regular Rate:Normal     Neuro/Psych    GI/Hepatic   Endo/Other  Morbid obesity  Renal/GU Renal disease     Musculoskeletal   Abdominal   Peds  Hematology   Anesthesia Other Findings   Reproductive/Obstetrics                          Anesthesia Physical Anesthesia Plan  ASA: III and emergent  Anesthesia Plan: General LMA   Post-op Pain Management:    Induction:   Airway Management Planned:   Additional Equipment:   Intra-op Plan:   Post-operative Plan:   Informed Consent: I have reviewed the patients History and Physical, chart, labs and discussed the procedure including the risks, benefits and alternatives for the proposed anesthesia with the patient or authorized representative who has indicated his/her understanding and acceptance.   Dental Advisory Given  Plan Discussed with: CRNA and Surgeon  Anesthesia Plan Comments:        Anesthesia Quick Evaluation

## 2014-01-15 NOTE — Brief Op Note (Signed)
01/14/2014 - 01/15/2014  9:55 AM  PATIENT:  Dianna Rossetti  43 y.o. female  PRE-OPERATIVE DIAGNOSIS:  right bi-malleolar ankle fracture  POST-OPERATIVE DIAGNOSIS:  right bi-malleolar ankle fracture  PROCEDURE:  Procedure(s): OPEN REDUCTION INTERNAL FIXATION (ORIF) ANKLE FRACTURE  SURGEON:  Surgeon(s): Meredith Pel, MD  ASSISTANT: none  ANESTHESIA:   general  EBL: 50 ml    Total I/O In: 900 [I.V.:900] Out: -   BLOOD ADMINISTERED: none  DRAINS: none   LOCAL MEDICATIONS USED:  none  SPECIMEN:  No Specimen  COUNTS:  YES  TOURNIQUET:  * No tourniquets in log *  DICTATION: .Other Dictation: Dictation Number [id number]  PLAN OF CARE: Admit for overnight observation  PATIENT DISPOSITION:  PACU - hemodynamically stable

## 2014-01-15 NOTE — Transfer of Care (Signed)
Immediate Anesthesia Transfer of Care Note  Patient: Ann Watson  Procedure(s) Performed: Procedure(s) with comments: OPEN REDUCTION INTERNAL FIXATION (ORIF) ANKLE FRACTURE (Right) - OPEN REDUCTION INTERNAL FIXATION BI-MALLEOLAR ANKLE FRACTURE (SMITH-NEPHEW  Patient Location: PACU  Anesthesia Type:General  Level of Consciousness: sedated  Airway & Oxygen Therapy: Patient Spontanous Breathing and Patient connected to face mask oxygen  Post-op Assessment: Report given to PACU RN and Post -op Vital signs reviewed and stable  Post vital signs: Reviewed and stable  Complications: No apparent anesthesia complications

## 2014-01-15 NOTE — Progress Notes (Signed)
Plan dc am if ok with PT On coumadin for dvt prophylaxis rx on chart

## 2014-01-15 NOTE — Discharge Instructions (Addendum)
Information on my medicine - Coumadin   (Warfarin)  This medication education was reviewed with me or my healthcare representative as part of my discharge preparation.  The pharmacist that spoke with me during my hospital stay was:  Leighton Parody, [facility]  Why was Coumadin prescribed for you? Coumadin was prescribed for you because you have a blood clot or a medical condition that can cause an increased risk of forming blood clots. Blood clots can cause serious health problems by blocking the flow of blood to the heart, lung, or brain. Coumadin can prevent harmful blood clots from forming. As a reminder your indication for Coumadin is:   Blood Clot Prevention After Orthopedic Surgery  What test will check on my response to Coumadin? While on Coumadin (warfarin) you will need to have an INR test regularly to ensure that your dose is keeping you in the desired range. The INR (international normalized ratio) number is calculated from the result of the laboratory test called prothrombin time (PT).  If an INR APPOINTMENT HAS NOT ALREADY BEEN MADE FOR YOU please schedule an appointment to have this lab work done by your health care provider within 7 days. Your INR goal is usually a number between:  2 to 3 or your provider may give you a more narrow range like 2-2.5.  Ask your health care provider during an office visit what your goal INR is.  What  do you need to  know  About  COUMADIN? Take Coumadin (warfarin) exactly as prescribed by your healthcare provider about the same time each day.  DO NOT stop taking without talking to the doctor who prescribed the medication.  Stopping without other blood clot prevention medication to take the place of Coumadin may increase your risk of developing a new clot or stroke.  Get refills before you run out.  What do you do if you miss a dose? If you miss a dose, take it as soon as you remember on the same day then continue your regularly scheduled regimen the next  day.  Do not take two doses of Coumadin at the same time.  Important Safety Information A possible side effect of Coumadin (Warfarin) is an increased risk of bleeding. You should call your healthcare provider right away if you experience any of the following:   Bleeding from an injury or your nose that does not stop.   Unusual colored urine (red or dark brown) or unusual colored stools (red or black).   Unusual bruising for unknown reasons.   A serious fall or if you hit your head (even if there is no bleeding).  Some foods or medicines interact with Coumadin (warfarin) and might alter your response to warfarin. To help avoid this:   Eat a balanced diet, maintaining a consistent amount of Vitamin K.   Notify your provider about major diet changes you plan to make.   Avoid alcohol or limit your intake to 1 drink for women and 2 drinks for men per day. (1 drink is 5 oz. wine, 12 oz. beer, or 1.5 oz. liquor.)  Make sure that ANY health care provider who prescribes medication for you knows that you are taking Coumadin (warfarin).  Also make sure the healthcare provider who is monitoring your Coumadin knows when you have started a new medication including herbals and non-prescription products.  Coumadin (Warfarin)  Major Drug Interactions  Increased Warfarin Effect Decreased Warfarin Effect  Alcohol (large quantities) Antibiotics (esp. Septra/Bactrim, Flagyl, Cipro) Amiodarone (Cordarone) Aspirin (  ASA) Cimetidine (Tagamet) Megestrol (Megace) NSAIDs (ibuprofen, naproxen, etc.) Piroxicam (Feldene) Propafenone (Rythmol SR) Propranolol (Inderal) Isoniazid (INH) Posaconazole (Noxafil) Barbiturates (Phenobarbital) Carbamazepine (Tegretol) Chlordiazepoxide (Librium) Cholestyramine (Questran) Griseofulvin Oral Contraceptives Rifampin Sucralfate (Carafate) Vitamin K   Coumadin (Warfarin) Major Herbal Interactions  Increased Warfarin Effect Decreased Warfarin Effect   Garlic Ginseng Ginkgo biloba Coenzyme Q10 Green tea St. Johns wort    Coumadin (Warfarin) FOOD Interactions  Eat a consistent number of servings per week of foods HIGH in Vitamin K (1 serving =  cup)  Collards (cooked, or boiled & drained) Kale (cooked, or boiled & drained) Mustard greens (cooked, or boiled & drained) Parsley *serving size only =  cup Spinach (cooked, or boiled & drained) Swiss chard (cooked, or boiled & drained) Turnip greens (cooked, or boiled & drained)  Eat a consistent number of servings per week of foods MEDIUM-HIGH in Vitamin K (1 serving = 1 cup)  Asparagus (cooked, or boiled & drained) Broccoli (cooked, boiled & drained, or raw & chopped) Brussel sprouts (cooked, or boiled & drained) *serving size only =  cup Lettuce, raw (green leaf, endive, romaine) Spinach, raw Turnip greens, raw & chopped   These websites have more information on Coumadin (warfarin):  FailFactory.se; VeganReport.com.au;  Non-weight bearing on your injured ankle until further notice. Keep your splint clean and dry.

## 2014-01-15 NOTE — Anesthesia Postprocedure Evaluation (Signed)
  Anesthesia Post Note  Patient: Ann Watson  Procedure(s) Performed: Procedure(s) (LRB): OPEN REDUCTION INTERNAL FIXATION (ORIF) ANKLE FRACTURE (Right)  Anesthesia type: GA  Patient location: PACU  Post pain: Pain level controlled  Post assessment: Post-op Vital signs reviewed  Last Vitals:  Filed Vitals:   01/15/14 1015  BP: 134/75  Pulse: 88  Temp:   Resp: 16    Post vital signs: Reviewed  Level of consciousness: sedated  Complications: No apparent anesthesia complications

## 2014-01-15 NOTE — H&P (Signed)
Ann Watson is an 43 y.o. female.   Chief Complaint: Right ankle pain HPI: Ann Watson is a 43 year old patient with right ankle pain. She slipped on a deck last night and sustained a displaced bilateral ankle fracture. There was no loss of consciousness and she denies any other orthopedic complaints. This is a mechanical slip and fall. His reduced and splinted in the emergency room. Patient's admitted currently for operative fixation of his displaced ankle fracture. 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 Patient is not a smoker. There is no family history of pulmonary embolism or DVT  Past Medical History  Diagnosis Date  . Chronic kidney disease     kidney stone extraction  . Fatigue   . Hemorrhoids   . External hemorrhoid, thrombosed 08/07/2011  . Obese   . Anal fissure 08/07/2011    Past Surgical History  Procedure Laterality Date  . Kidney stone surgery      Family History  Problem Relation Age of Onset  . Diabetes Mother    Social History:  reports that she quit smoking about 6 years ago. She has never used smokeless tobacco. She reports that she drinks alcohol. She reports that she does not use illicit drugs.  Allergies: No Known Allergies  Medications Prior to Admission  Medication Sig Dispense Refill  . AMBULATORY NON FORMULARY MEDICATION Place 1 application rectally 4 (four) times daily. Diltiazem 2% compounded suspension.  1 Tube  2  . amoxicillin (AMOXIL) 500 MG capsule Take 500 mg by mouth 2 (two) times daily. For 10 days      . Flaxseed, Linseed, (FLAX SEED OIL PO) Take 1 capsule by mouth at bedtime.      Marland Kitchen HORSE CHESTNUT PO Take 1 tablet by mouth at bedtime.      . hydrocortisone (ANUSOL-HC) 2.5 % rectal cream Place rectally 2 (two) times daily. Apply around anus for irritated & painful hemorrhoids  15 g  2  . oxyCODONE (OXY IR/ROXICODONE) 5 MG immediate release tablet Take 1-2 tablets (5-10 mg total) by mouth every 6 (six) hours as needed.  20 tablet  0     Results for orders placed during the hospital encounter of 01/14/14 (from the past 48 hour(s))  CBC     Status: Abnormal   Collection Time    01/14/14  9:05 PM      Result Value Range   WBC 19.3 (*) 4.0 - 10.5 K/uL   RBC 4.58  3.87 - 5.11 MIL/uL   Hemoglobin 13.3  12.0 - 15.0 g/dL   HCT 40.0  36.0 - 46.0 %   MCV 87.3  78.0 - 100.0 fL   MCH 29.0  26.0 - 34.0 pg   MCHC 33.3  30.0 - 36.0 g/dL   RDW 13.6  11.5 - 15.5 %   Platelets 202  150 - 400 K/uL  BASIC METABOLIC PANEL     Status: Abnormal   Collection Time    01/14/14  9:05 PM      Result Value Range   Sodium 137  137 - 147 mEq/L   Potassium 4.2  3.7 - 5.3 mEq/L   Chloride 100  96 - 112 mEq/L   CO2 26  19 - 32 mEq/L   Glucose, Bld 144 (*) 70 - 99 mg/dL   BUN 14  6 - 23 mg/dL   Creatinine, Ser 0.81  0.50 - 1.10 mg/dL   Calcium 9.2  8.4 - 10.5 mg/dL   GFR calc non Af Amer 87 (*) >  90 mL/min   GFR calc Af Amer >90  >90 mL/min   Comment: (NOTE)     The eGFR has been calculated using the CKD EPI equation.     This calculation has not been validated in all clinical situations.     eGFR's persistently <90 mL/min signify possible Chronic Kidney     Disease.  SURGICAL PCR SCREEN     Status: None   Collection Time    01/15/14  4:04 AM      Result Value Range   MRSA, PCR NEGATIVE  NEGATIVE   Staphylococcus aureus NEGATIVE  NEGATIVE   Comment:            The Xpert SA Assay (FDA     approved for NASAL specimens     in patients over 74 years of age),     is one component of     a comprehensive surveillance     program.  Test performance has     been validated by [employer] for patients greater     than or equal to 57 year old.     It is not intended     to diagnose infection nor to     guide or monitor treatment.   Dg Tibia/fibula Right  01/14/2014   CLINICAL DATA:  Fall.  Right leg injury and pain.  EXAM: RIGHT TIBIA AND FIBULA - 2 VIEW  COMPARISON:  None.  FINDINGS: Bimalleolar ankle fracture seen with lateral  dislocation of the talus. No proximal tibial or fibular fractures are identified.  IMPRESSION: Bimalleolar ankle fracture -dislocation. No proximal tibial or fibular fractures identified.   Electronically Signed   By: Earle Gell M.D.   On: 01/14/2014 20:49   Dg Ankle Complete Right  01/14/2014   CLINICAL DATA:  Status post reduction  EXAM: RIGHT ANKLE - COMPLETE 3+ VIEW  COMPARISON:  None.  FINDINGS: Exam detail is diminished due to casting material. There has been interval partial reduction and internal fixation of the comminuted fracture dislocation of the right ankle.  IMPRESSION: 1. Interval partial reduction of comminuted fracture dislocation of the right ankle.   Electronically Signed   By: Kerby Moors M.D.   On: 01/14/2014 23:34   Dg Ankle Complete Right  01/14/2014   CLINICAL DATA:  Fall.  Ankle pain.  Ankle fracture deformity.  EXAM: RIGHT ANKLE - COMPLETE 3+ VIEW  COMPARISON:  None.  FINDINGS: Displaced fractures are seen through the medial malleolus and distal tibia just above the level of the tibial plafond. Lateral dislocation the talus is seen.  IMPRESSION: Bimalleolar ankle fracture with lateral dislocation of the talus.   Electronically Signed   By: Earle Gell M.D.   On: 01/14/2014 20:47   Dg Chest Port 1 View  01/14/2014   CLINICAL DATA:  Preoperative chest radiograph for ankle fracture. History of smoking.  EXAM: PORTABLE CHEST - 1 VIEW  COMPARISON:  None.  FINDINGS: The lungs are mildly hypoexpanded but appear grossly clear. Mild vascular crowding is noted. There is no evidence of focal opacification, pleural effusion or pneumothorax.  The cardiomediastinal silhouette is borderline normal in size. No acute osseous abnormalities are seen.  IMPRESSION: Mildly hypoexpanded but grossly clear lungs.   Electronically Signed   By: Garald Balding M.D.   On: 01/14/2014 23:51   Dg Foot Complete Right  01/14/2014   CLINICAL DATA:  Slip and fall, right foot pain and deformity  EXAM: RIGHT  FOOT COMPLETE -  3+ VIEW  COMPARISON:  Please see ankle radiographs obtained same date, dictated separately  FINDINGS: Suboptimal visualization due to positioning and splint material. Two view technique only was obtained. Right ankle fracture dislocation incompletely imaged. Possible linear radiopaque foreign body adjacent to the proximal phalanx of the great toe. Metatarsals are unremarkable.  IMPRESSION: Right ankle fracture-dislocation partly visualized, please see dedicated report dictated separately.  Linear radiopaque foreign body adjacent to the proximal phalanx of the great toe which could be the tip of a needle.  No gross evidence for displaced foot fracture.   Electronically Signed   By: Conchita Paris M.D.   On: 01/14/2014 20:54    Review of Systems  Constitutional: Negative.   HENT: Negative.   Eyes: Negative.   Respiratory: Negative.   Cardiovascular: Negative.   Gastrointestinal: Negative.   Genitourinary: Negative.   Musculoskeletal: Positive for joint pain.  Skin: Negative.   Neurological: Negative.   Endo/Heme/Allergies: Negative.   Psychiatric/Behavioral: Negative.     Blood pressure 126/81, pulse 91, temperature 98.1 F (36.7 C), temperature source Oral, resp. rate 16, height _0  (1.626 m), weight 108.863 kg (240 lb), last menstrual period 12/23/2013, SpO2 95.00%. Physical Exam  Constitutional: She appears well-developed.  HENT:  Head: Normocephalic.  Eyes: Pupils are equal, round, and reactive to light.  Neck: Normal range of motion.  Cardiovascular: Normal rate.   Respiratory: Effort normal.  Neurological: She is alert.  Skin: Skin is warm.  Psychiatric: She has a normal mood and affect.   examination of the right ankle demonstrates a splint on the right ankle. Toes have good perfusion mobility and sensation. Knee range of motion is intact. On the right-hand side. No groin pain with internal extra rotation of the leg.  Assessment/Plan Impression is displaced  bimalleolar ankle fracture which was dislocated and is now reduced. Plan is open reduction internal fixation with plates and screws on the lateral side 2 screws on the medial side risk and benefits discussed with the patient including but not limited to infection nerve vessel damage ankle stiffness need for hardware removal. Anticipate keeping the patient overnight tonight with physical therapy to work with her tomorrow. Out of work time to 4 weeks pending on how mobile and agile she is with crutches and/or walker. We'll plan to place the patient on Coumadin for at least 4 weeks for DVT prophylaxis. All questions answered.  DEAN,GREGORY SCOTT 01/15/2014, 7:01 AM

## 2014-01-15 NOTE — Progress Notes (Signed)
ANTICOAGULATION CONSULT NOTE - Initial Consult  Pharmacy Consult for Warfarin Indication: VTE prophylaxis  No Known Allergies  Patient Measurements: Height: 5\' 4"  (162.6 cm) Weight: 240 lb (108.863 kg) IBW/kg (Calculated) : 54.7  Vital Signs: Temp: 98.6 F (37 C) (01/24 1140) Temp src: Oral (01/24 1140) BP: 121/77 mmHg (01/24 1140) Pulse Rate: 91 (01/24 1140)  Labs:  Recent Labs  01/14/14 2105  HGB 13.3  HCT 40.0  PLT 202  CREATININE 0.81    Estimated Creatinine Clearance: 106.9 ml/min (by C-G formula based on Cr of 0.81).   Medical History: Past Medical History  Diagnosis Date  . Chronic kidney disease     kidney stone extraction  . Fatigue   . Hemorrhoids   . External hemorrhoid, thrombosed 08/07/2011  . Obese   . Anal fissure 08/07/2011    Medications:  Scheduled:  . sodium chloride   Intravenous STAT  . 0.9 % NaCl with KCl 20 mEq / L      .  ceFAZolin (ANCEF) IV  2 g Intravenous Q8H   Infusions:  . 0.9 % NaCl with KCl 20 mEq / L 75 mL/hr at 01/15/14 1144    Assessment: 43 yo female s/p right ankle ORIF on 1/24 due to fall. Warfarin ordered for VTE prophylaxis.  Goal of Therapy:  INR 2-3 Monitor platelets by anticoagulation protocol: Yes   Plan:   Warfarin 7.5mg  po x 1 today  Daily PT/INR  Provide warfarin education prior to discharge  Peggyann Juba, PharmD, BCPS Pager: [phone] 01/15/2014,12:08 PM

## 2014-01-15 NOTE — Op Note (Signed)
NAMERETINA, BERNARDY              ACCOUNT NO.:  [id number]  MEDICAL RECORD NO.:  [id number]  LOCATION:  1856                         FACILITY:  [facility]  PHYSICIAN:  Anderson Malta, M.D.    DATE OF BIRTH:  November 14, 1971  DATE OF PROCEDURE: DATE OF DISCHARGE:                              OPERATIVE REPORT   PREOPERATIVE DIAGNOSIS:  Right ankle bimalleolar ankle fracture.  POSTOPERATIVE DIAGNOSES:  Right ankle bimalleolar ankle fracture.  PROCEDURE:  Right ankle bimalleolar ankle fracture.  Open reduction and internal fixation using Smith and Nephew 5-hole lateral plate and 2-4 cannulated screws medially.  SURGEON:  Anderson Malta, MD.  ASSISTANT:  None.  ANESTHESIA:  General.  ESTIMATED BLOOD LOSS:  50 mL.  Ankle Esmarch up for approximately an hour.  INDICATIONS:  Ann Watson, the patient fell and injured her right ankle who presents for operative management of unstable ankle fracture after explanation risks and benefits.  PROCEDURE IN DETAIL:  The patient was brought to the operating room were general endotracheal anesthesia was induced.  Preop antibiotics were administered.  Time-out was called.  Right leg was prescrubbed with alcohol and Betadine, which was allowed to air dry.  Prepped with DuraPrep solution and  draped in sterile manner.  Charlie Pitter was used cover the operative field.  Time-out was called.  The ankle Esmarch was then utilized for approximately an hour, approach was made to the lateral malleolus.  Skin and subcutaneous tissue sharply divided.  Care was taken to avoid injury to superficial peroneal nerve.  The patient's fracture was then significantly displaced posteriorly and the ankle was unstable.  Periosteum was elevated from around the fracture site. Irrigation performed and the fracture was reduced.  A lag screw placed superior and anterior to posterior distal.  Good reduction visualized in the AP and lateral planes under fluoroscopy.  Stabilizing 5-hole plate was  then placed with locking screws distally x3, and cortical screws proximally x4.  Syndesmosis was stable.  Following this, this incision was packed and then the attention was directed towards the medial side. Incision was made over the medial malleolar fracture.  Skin and subcutaneous tissue were sharply divided.  The fracture was reduced near anatomically.  Two screws were then placed through the medial malleolus tip around the anterior calculi.  The correct placements confirmed the AP and lateral planes under fluoroscopy.  Good compression of the fragment was achieved in good anatomic reduction achieved and mortise planes.  At this time, thorough irrigation was performed of both incisions.  Ankle Esmarch was released.  Both incisions were then closed using interrupted inverted 2- 0 Vicryl suture, 3-0 Vicryl suture, and 3-0 nylon.  Bulky well-padded splint were applied.  The patient tolerated the procedure well without immediate complication.     Anderson Malta, M.D.     GSD/MEDQ  D:  01/15/2014  T:  01/15/2014  Job:  [id number]

## 2014-01-15 NOTE — Progress Notes (Signed)
Per verbal order from Dr. Aline Brochure, was told not to page Dr. Marlou Sa when patient was admitted to floor. Dr. Marlou Sa to see patient in am. Will continue to monitor pt.

## 2014-01-16 LAB — PROTIME-INR
INR: 1.15 (ref 0.00–1.49)
Prothrombin Time: 14.5 seconds (ref 11.6–15.2)

## 2014-01-16 MED ORDER — OXYCODONE-ACETAMINOPHEN 10-325 MG PO TABS: 1.0000 | ORAL_TABLET | Freq: Four times a day (QID) | ORAL | Status: DC | PRN

## 2014-01-16 NOTE — Progress Notes (Signed)
Pt stable, scripts, d/c instructions, and equipment given with no questions/concerns voiced by pt or family.  Pt transported via wheelchair to private vehicle by NT and family. 

## 2014-01-16 NOTE — Discharge Summary (Signed)
Patient ID: Ann Watson MRN: [id number] DOB/AGE: [birth year] 43 y.o.  Admit date: 01/14/2014 Discharge date: 01/16/2014  Admission Diagnoses:  Active Problems:   Displaced bimalleolar fracture of right ankle   Ankle fracture   Discharge Diagnoses:  Same  Past Medical History  Diagnosis Date  . Chronic kidney disease     kidney stone extraction  . Fatigue   . Hemorrhoids   . External hemorrhoid, thrombosed 08/07/2011  . Obese   . Anal fissure 08/07/2011    Surgeries: Procedure(s): OPEN REDUCTION INTERNAL FIXATION (ORIF) ANKLE FRACTURE on 01/14/2014 - 01/15/2014   Consultants:    Discharged Condition: Improved  Hospital Course: Ann Watson is an 43 y.o. female who was admitted 01/14/2014 for operative treatment of<principal problem not specified>. Patient has severe unremitting pain that affects sleep, daily activities, and work/hobbies. After pre-op clearance the patient was taken to the operating room on 01/14/2014 - 01/15/2014 and underwent  Procedure(s): OPEN REDUCTION INTERNAL FIXATION (ORIF) ANKLE FRACTURE.    Patient was given perioperative antibiotics: Anti-infectives   Start     Dose/Rate Route Frequency Ordered Stop   01/15/14 1400  ceFAZolin (ANCEF) IVPB 2 g/50 mL premix     2 g 100 mL/hr over 30 Minutes Intravenous 3 times per day 01/15/14 1141 01/15/14 2252   01/15/14 0815  ceFAZolin (ANCEF) IVPB 2 g/50 mL premix     2 g 100 mL/hr over 30 Minutes Intravenous  Once 01/15/14 0801 01/15/14 0750       Patient was given sequential compression devices, early ambulation, and chemoprophylaxis to prevent DVT.  Patient benefited maximally from hospital stay and there were no complications.    Recent vital signs: Patient Vitals for the past 24 hrs:  BP Temp Temp src Pulse Resp SpO2  01/16/14 0659 130/83 mmHg 97.8 F (36.6 C) Oral 90 16 98 %  01/15/14 2100 121/74 mmHg 98 F (36.7 C) Oral 97 16 97 %  01/15/14 1800 133/74 mmHg 98 F (36.7 C) Oral 84 16 99 %   01/15/14 1407 125/82 mmHg 99.2 F (37.3 C) Oral 99 16 96 %  01/15/14 1200 123/80 mmHg 98.3 F (36.8 C) Oral 94 16 97 %  01/15/14 1140 121/77 mmHg 98.6 F (37 C) Oral 91 14 95 %  01/15/14 1038 128/67 mmHg 98.3 F (36.8 C) - 89 13 97 %  01/15/14 1030 131/67 mmHg 98.3 F (36.8 C) - 88 17 96 %  01/15/14 1020 - - - 88 17 100 %  01/15/14 1015 134/75 mmHg - - 88 16 96 %  01/15/14 1010 - - - 91 16 100 %  01/15/14 1000 126/86 mmHg - - 99 13 99 %  01/15/14 0955 - 98.7 F (37.1 C) - 89 19 100 %     Recent laboratory studies:  Recent Labs  01/14/14 2105 01/16/14 0455  WBC 19.3*  --   HGB 13.3  --   HCT 40.0  --   PLT 202  --   NA 137  --   K 4.2  --   CL 100  --   CO2 26  --   BUN 14  --   CREATININE 0.81  --   GLUCOSE 144*  --   INR  --  1.15  CALCIUM 9.2  --      Discharge Medications:     Medication List    STOP taking these medications       amoxicillin 500 MG capsule  Commonly known as:  AMOXIL  FLAX SEED OIL PO     HORSE CHESTNUT PO     oxyCODONE 5 MG immediate release tablet  Commonly known as:  Oxy IR/ROXICODONE      TAKE these medications       AMBULATORY NON FORMULARY MEDICATION  Place 1 application rectally 4 (four) times daily. Diltiazem 2% compounded suspension.     hydrocortisone 2.5 % rectal cream  Commonly known as:  ANUSOL-HC  Place rectally 2 (two) times daily. Apply around anus for irritated & painful hemorrhoids     methocarbamol 500 MG tablet  Commonly known as:  ROBAXIN  Take 1 tablet (500 mg total) by mouth every 6 (six) hours as needed for muscle spasms.     oxyCODONE-acetaminophen 10-325 MG per tablet  Commonly known as:  PERCOCET  Take 1-2 tablets by mouth every 6 (six) hours as needed for pain.     warfarin 5 MG tablet  Commonly known as:  COUMADIN  Take 1 tablet (5 mg total) by mouth daily.        Diagnostic Studies: Dg Tibia/fibula Right  01/14/2014   CLINICAL DATA:  Fall.  Right leg injury and pain.  EXAM: RIGHT  TIBIA AND FIBULA - 2 VIEW  COMPARISON:  None.  FINDINGS: Bimalleolar ankle fracture seen with lateral dislocation of the talus. No proximal tibial or fibular fractures are identified.  IMPRESSION: Bimalleolar ankle fracture -dislocation. No proximal tibial or fibular fractures identified.   Electronically Signed   By: Earle Gell M.D.   On: 01/14/2014 20:49   Dg Ankle Complete Right  01/15/2014   CLINICAL DATA:  Status post ORIF of the right ankle.  EXAM: RIGHT ANKLE - COMPLETE 3+ VIEW  COMPARISON:  01/14/2014.  FINDINGS: Four intraoperative spot films of the right ankle demonstrate interval placement of a lateral plate and screw fixation device traversing the previously noted distal fibular fracture. Anatomic alignment has been restored. Two fixation screws also traversed the previously noted fracture of the medial malleolus, with restoration of anatomic alignment. Alignment of the ankle mortise now also appears anatomic.  IMPRESSION: 1. Intraoperative documentation of ORIF for bimalleolar right ankle fracture, with restoration of anatomic alignment, as above.   Electronically Signed   By: Vinnie Langton M.D.   On: 01/15/2014 10:55   Dg Ankle Complete Right  01/14/2014   CLINICAL DATA:  Status post reduction  EXAM: RIGHT ANKLE - COMPLETE 3+ VIEW  COMPARISON:  None.  FINDINGS: Exam detail is diminished due to casting material. There has been interval partial reduction and internal fixation of the comminuted fracture dislocation of the right ankle.  IMPRESSION: 1. Interval partial reduction of comminuted fracture dislocation of the right ankle.   Electronically Signed   By: Kerby Moors M.D.   On: 01/14/2014 23:34   Dg Ankle Complete Right  01/14/2014   CLINICAL DATA:  Fall.  Ankle pain.  Ankle fracture deformity.  EXAM: RIGHT ANKLE - COMPLETE 3+ VIEW  COMPARISON:  None.  FINDINGS: Displaced fractures are seen through the medial malleolus and distal tibia just above the level of the tibial plafond.  Lateral dislocation the talus is seen.  IMPRESSION: Bimalleolar ankle fracture with lateral dislocation of the talus.   Electronically Signed   By: Earle Gell M.D.   On: 01/14/2014 20:47   Dg Chest Port 1 View  01/14/2014   CLINICAL DATA:  Preoperative chest radiograph for ankle fracture. History of smoking.  EXAM: PORTABLE CHEST - 1 VIEW  COMPARISON:  None.  FINDINGS:  The lungs are mildly hypoexpanded but appear grossly clear. Mild vascular crowding is noted. There is no evidence of focal opacification, pleural effusion or pneumothorax.  The cardiomediastinal silhouette is borderline normal in size. No acute osseous abnormalities are seen.  IMPRESSION: Mildly hypoexpanded but grossly clear lungs.   Electronically Signed   By: Roanna Raider M.D.   On: 01/14/2014 23:51   Dg Ankle Right Port  01/15/2014   CLINICAL DATA:  Ankle injury  EXAM: PORTABLE RIGHT ANKLE - 2 VIEW  COMPARISON:  900 hr  FINDINGS: Two screws are present in the medial malleolus. Plates and screws in the distal fibula. There is some displacement at the medial malleolus fracture. Fibula is anatomic.  IMPRESSION: ORIF bimalleolar fracture.   Electronically Signed   By: Maryclare Bean M.D.   On: 01/15/2014 10:27   Dg Foot Complete Right  01/14/2014   CLINICAL DATA:  Slip and fall, right foot pain and deformity  EXAM: RIGHT FOOT COMPLETE - 3+ VIEW  COMPARISON:  Please see ankle radiographs obtained same date, dictated separately  FINDINGS: Suboptimal visualization due to positioning and splint material. Two view technique only was obtained. Right ankle fracture dislocation incompletely imaged. Possible linear radiopaque foreign body adjacent to the proximal phalanx of the great toe. Metatarsals are unremarkable.  IMPRESSION: Right ankle fracture-dislocation partly visualized, please see dedicated report dictated separately.  Linear radiopaque foreign body adjacent to the proximal phalanx of the great toe which could be the tip of a needle.  No  gross evidence for displaced foot fracture.   Electronically Signed   By: Christiana Pellant M.D.   On: 01/14/2014 20:54   Dg C-arm 61-120 Min-no Report  01/15/2014   CLINICAL DATA: ORIF right ankle   C-ARM 61-120 MINUTES  Fluoroscopy was utilized by the requesting physician.  No radiographic  interpretation.     Disposition: to home      Discharge Orders   Future Orders Complete By Expires   Call MD / Call 911  As directed    Comments:     If you experience chest pain or shortness of breath, CALL 911 and be transported to the hospital emergency room.  If you develope a fever above 101 F, pus (white drainage) or increased drainage or redness at the wound, or calf pain, call your surgeon's office.   Constipation Prevention  As directed    Comments:     Drink plenty of fluids.  Prune juice may be helpful.  You may use a stool softener, such as Colace (over the counter) 100 mg twice a day.  Use MiraLax (over the counter) for constipation as needed.   Diet - low sodium heart healthy  As directed    Discharge instructions  As directed    Comments:     Touch down weight bearing only for transfers right leg Elevate leg Ok to bend knee Keep splint dry   Discharge patient  As directed    Increase activity slowly as tolerated  As directed    Touch down weight bearing  As directed    Questions:     Laterality:     Extremity:        Follow-up Information   Follow up with Cammy Copa, MD. Schedule an appointment as soon as possible for a visit in 2 weeks.   Specialty:  Orthopedic Surgery   Contact information:   [address] [place] [address] [phone]        Signed: Doneen Poisson  Y 01/16/2014, 9:40 AM

## 2014-01-16 NOTE — Care Management Note (Signed)
MD order for RW. OT recommends 3N1. [facility] rep [employer] notified. No other needs assessed prior to dc. No further PT/OT follow up recommended.    Venita Lick Jeanpierre Thebeau,MSN,RN [phone]

## 2014-01-16 NOTE — Evaluation (Signed)
Occupational Therapy Evaluation Patient Details Name: Ann Watson MRN: [id number] DOB: Aug 16, 1971 Today's Date: 01/16/2014 Time: 6712-4580 OT Time Calculation (min): 25 min  OT Assessment / Plan / Recommendation History of present illness 43 y/o WF who suffered mechanical fall and suffered displaced bimalleolar fracture of R ankls and s/p ORIF with TTWB orders.   Clinical Impression   Patient evaluated by Occupational Therapy with no further acute OT needs identified. All education has been completed and the patient has no further questions. See below for any follow-up Occupational Therapy or equipment needs. OT to sign off. Thank you for referral.      OT Assessment  Patient does not need any further OT services    Follow Up Recommendations  No OT follow up    Barriers to Discharge      Equipment Recommendations  3 in 1 bedside comode;Other (comment) (knee walker and RW)    Recommendations for Other Services    Frequency       Precautions / Restrictions Precautions Precautions: Fall Restrictions Weight Bearing Restrictions: Yes RLE Weight Bearing: Touchdown weight bearing   Pertinent Vitals/Pain 3-4 out 10 pain Reports "it just throbs"    ADL  Eating/Feeding: Independent Where Assessed - Eating/Feeding: Chair Grooming: Wash/dry hands;Wash/dry face;Teeth care Where Assessed - Grooming: Unsupported standing Toilet Transfer: Modified independent Toilet Transfer Method: Sit to Loss adjuster, chartered: Raised toilet seat with arms (or 3-in-1 over toilet) Tub/Shower Transfer: Nurse, learning disability Method: Therapist, art: Walk in shower Equipment Used: Gait belt;Rolling walker (crutches ) Transfers/Ambulation Related to ADLs: Pt complete use of RW , crutches and knee walker during session. MD blackman notified of need for order to obtain knee walker. Pt demonstrates ability to complete all necessary transfers and will have  daughter (A) at d/c ADL Comments: Pt educated on using reacher for LB dressing and dressing Rt LE first. pt educated on using trash bag to keep Rt LE dry. pt demonstrates transfer over height higher than shower threshold with stair demo. pt will have daughter and spouse (A) upon d/c. pt educated that sheets can be reversed on bed to allow patient to lay with head a foot board to make access to bed surface easier. Pt without further questions    OT Diagnosis:    OT Problem List:   OT Treatment Interventions:     OT Goals(Current goals can be found in the care plan section) Acute Rehab OT Goals Patient Stated Goal: to return home today  Visit Information  Last OT Received On: 01/16/14 Assistance Needed: +1 PT/OT/SLP Co-Evaluation/Treatment: Yes Reason for Co-Treatment: For patient/therapist safety PT goals addressed during session: Proper use of DME;Balance;Mobility/safety with mobility OT goals addressed during session: ADL's and self-care History of Present Illness: 43 y/o WF who suffered mechanical fall and suffered displaced bimalleolar fracture of R ankls and s/p ORIF with TTWB orders.       Prior [place] expects to be discharged to:: Private residence Living Arrangements: Children (daughter and fiance) Type of Home: House Home Access: Stairs to enter Technical brewer of Steps: 3 Entrance Stairs-Rails: None Home Layout: One level Home Equipment: Grab bars - toilet Additional Comments: executive assist to VIP - primarily sitting position.  Prior Function Level of Independence: Independent Communication Communication: No difficulties Dominant Hand: Right         Vision/Perception Vision - History Baseline Vision: No visual deficits Patient Visual Report: No change from baseline   Cognition  Cognition Arousal/Alertness: Awake/alert Behavior During Therapy: WFL for tasks assessed/performed Overall Cognitive Status: Within  Functional Limits for tasks assessed    Extremity/Trunk Assessment Upper Extremity Assessment Upper Extremity Assessment: Defer to OT evaluation Lower Extremity Assessment Lower Extremity Assessment: Overall WFL for tasks assessed;RLE deficits/detail RLE Deficits / Details: R short leg cast RLE: Unable to fully assess due to immobilization Cervical / Trunk Assessment Cervical / Trunk Assessment: Normal     Mobility Bed Mobility Overal bed mobility: Independent Transfers Overall transfer level: Modified independent Equipment used: Crutches     Exercise     Balance Balance Overall balance assessment: Needs assistance Sitting balance-Leahy Scale: Normal Standing balance-Leahy Scale: Fair Standing balance comment: due to TTWB R LE   End of Session OT - End of Session Activity Tolerance: Patient tolerated treatment well Patient left: in chair;with call bell/phone within reach Nurse Communication: Mobility status;Precautions  GO Functional Assessment Tool Used: clinical judgement Functional Limitation: Self care Self Care Current Status (G8811): At least 20 percent but less than 40 percent impaired, limited or restricted Self Care Goal Status (S3159): At least 20 percent but less than 40 percent impaired, limited or restricted Self Care Discharge Status [phone]): At least 1 percent but less than 20 percent impaired, limited or restricted   Peri Maris 01/16/2014, 11:18 AM Pager: [phone]

## 2014-01-16 NOTE — Progress Notes (Signed)
Coumadin Consult - Brief Notes   77 yoF started on Coumadin 1/24 s/p ORIF. INR 1.15 this AM. Discharge summaries completed and pt will be discharge on Coumadin 5mg  po daily with PT/INR follow up. Coumadin education provided on 1/24 to patient and family members.   Plan: Pharmacy will not enter Coumadin dose for tonight. RN please call by [phone] if pt is not discharged for Coumadin dose.   Vanessa Milton, PharmD, BCPS Pager: [phone] 11:47 AM Pharmacy #: [phone]

## 2014-01-16 NOTE — Evaluation (Signed)
Physical Therapy Evaluation Patient Details Name: Ann Watson MRN: [id number] DOB: Apr 27, 1971 Today's Date: 01/16/2014 Time: 0932-3557 PT Time Calculation (min): 32 min  PT Assessment / Plan / Recommendation History of Present Illness  43 y/o WF who suffered mechanical fall and suffered displaced bimalleolar fracture of R ankle and s/p ORIF with TTWB orders.  Clinical Impression  Pt admitted with R ankle fx and s/p ORIF. Pt educated on use of different DME and pt did well with RW and knee scooter for longer distances.  Pt ambulated with crutches as well, but did not feel as confident with them especially on the stairs.  All education complete and pt scheduled for d/c.  Will d/c from skilled PT services.    PT Assessment  Patent does not need any further PT services    Follow Up Recommendations  No PT follow up    Does the patient have the potential to tolerate intense rehabilitation      Barriers to Discharge        Equipment Recommendations  Rolling walker with 5" wheels;Other (comment) (knee walker for community distances.)    Recommendations for Other Services     Frequency      Precautions / Restrictions Precautions Precautions: Fall Restrictions Weight Bearing Restrictions: Yes RLE Weight Bearing: Touchdown weight bearing   Pertinent Vitals/Pain Pt requesting pain meds at end of treatment.      Mobility  Bed Mobility Overal bed mobility: Independent Transfers Overall transfer level: Modified independent Equipment used: Crutches Ambulation/Gait Ambulation/Gait assistance: Min guard Ambulation Distance (Feet): 100 Feet Assistive device: Crutches (and knee walker) Gait Pattern/deviations: Step-to pattern General Gait Details: Pt keeps R foot up in NWB although she is TTWB.  Pt ambulated with crutches, knee walker, and with RW. Pt did well with all devices, but more steady with walkers than crutches, but no LOb with crutches.  Pt fatigued with gait and  stairs. Stairs: Yes Stairs assistance: Min assist Stair Management: Backwards;Forwards;With crutches;With walker Number of Stairs: 1 General stair comments: Pt did stairs x 2 reps wiht cructches with MIN A and with RW with MIN/guard.  Pt felt safer with RW.    Exercises     PT Diagnosis:    PT Problem List:   PT Treatment Interventions:       PT Goals(Current goals can be found in the care plan section) Acute Rehab PT Goals PT Goal Formulation: No goals set, d/c therapy  Visit Information  Last PT Received On: 01/16/14 Assistance Needed: +1 PT/OT/SLP Co-Evaluation/Treatment: Yes Reason for Co-Treatment: For patient/therapist safety PT goals addressed during session: Proper use of DME;Balance;Mobility/safety with mobility OT goals addressed during session: ADL's and self-care History of Present Illness: 43 y/o WF who suffered mechanical fall and suffered displaced bimalleolar fracture of R ankls and s/p ORIF with TTWB orders.       Prior [place] expects to be discharged to:: Private residence Living Arrangements: Children (daughter and fiance) Type of Home: House Home Access: Stairs to enter Technical brewer of Steps: 3 Entrance Stairs-Rails: None Home Layout: One level Home Equipment: Grab bars - toilet Additional Comments: executive assist to VIP - primarily sitting position.  Prior Function Level of Independence: Independent Communication Communication: No difficulties Dominant Hand: Right    Cognition  Cognition Arousal/Alertness: Awake/alert Behavior During Therapy: WFL for tasks assessed/performed Overall Cognitive Status: Within Functional Limits for tasks assessed    Extremity/Trunk Assessment Upper Extremity Assessment Upper Extremity Assessment: Defer to OT evaluation Lower Extremity  Assessment Lower Extremity Assessment: Overall WFL for tasks assessed;RLE deficits/detail RLE Deficits / Details: R short leg  cast RLE: Unable to fully assess due to immobilization Cervical / Trunk Assessment Cervical / Trunk Assessment: Normal   Balance Balance Overall balance assessment: Needs assistance Standing balance-Leahy Scale: Fair Standing balance comment: due to TTWB R LE  End of Session PT - End of Session Equipment Utilized During Treatment: Gait belt Activity Tolerance: Patient tolerated treatment well Patient left: in chair;with call bell/phone within reach Nurse Communication: Mobility status;Other (comment) (DME)  GP Functional Assessment Tool Used: objective findings and clinical judgement Functional Limitation: Mobility: Walking and moving around Mobility: Walking and Moving Around Current Status [phone]): At least 1 percent but less than 20 percent impaired, limited or restricted Mobility: Walking and Moving Around Goal Status [phone]): At least 1 percent but less than 20 percent impaired, limited or restricted Mobility: Walking and Moving Around Discharge Status [phone]): At least 1 percent but less than 20 percent impaired, limited or restricted   [facility] LUBECK 01/16/2014, 10:25 AM

## 2014-01-16 NOTE — Progress Notes (Signed)
Subjective: 1 Day Post-Op Procedure(s) (LRB): OPEN REDUCTION INTERNAL FIXATION (ORIF) ANKLE FRACTURE (Right) Patient reports pain as moderate.  Working with PT and OT this am.  Objective: Vital signs in last 24 hours: Temp:  [97.8 F (36.6 C)-99.2 F (37.3 C)] 97.8 F (36.6 C) (01/25 0659) Pulse Rate:  [84-99] 90 (01/25 0659) Resp:  [13-19] 16 (01/25 0659) BP: (121-134)/(67-86) 130/83 mmHg (01/25 0659) SpO2:  [95 %-100 %] 98 % (01/25 0659)  Intake/Output from previous day: 01/24 0701 - 01/25 0700 In: 1550 [P.O.:600; I.V.:950] Out: 2250 [Urine:2250] Intake/Output this shift: Total I/O In: 240 [P.O.:240] Out: -    Recent Labs  01/14/14 2105  HGB 13.3    Recent Labs  01/14/14 2105  WBC 19.3*  RBC 4.58  HCT 40.0  PLT 202    Recent Labs  01/14/14 2105  NA 137  K 4.2  CL 100  CO2 26  BUN 14  CREATININE 0.81  GLUCOSE 144*  CALCIUM 9.2    Recent Labs  01/16/14 0455  INR 1.15    Incision: dressing C/D/I  Assessment/Plan: 1 Day Post-Op Procedure(s) (LRB): OPEN REDUCTION INTERNAL FIXATION (ORIF) ANKLE FRACTURE (Right) Up with therapy Discharge to home today with DME as determined by therapy. Coumadin for 4 weeks  BLACKMAN,CHRISTOPHER Y 01/16/2014, 9:36 AM

## 2014-01-17 ENCOUNTER — Encounter (HOSPITAL_COMMUNITY): Payer: Self-pay | Admitting: Orthopedic Surgery

## 2014-06-15 ENCOUNTER — Encounter: Payer: Self-pay | Admitting: Cardiology

## 2015-12-21 DIAGNOSIS — M5412 Radiculopathy, cervical region: Secondary | ICD-10-CM | POA: Insufficient documentation

## 2018-03-09 DIAGNOSIS — Z87442 Personal history of urinary calculi: Secondary | ICD-10-CM | POA: Insufficient documentation

## 2018-03-09 DIAGNOSIS — N200 Calculus of kidney: Secondary | ICD-10-CM | POA: Insufficient documentation

## 2018-05-22 ENCOUNTER — Encounter (HOSPITAL_COMMUNITY): Payer: Self-pay

## 2018-05-22 ENCOUNTER — Other Ambulatory Visit: Payer: Self-pay

## 2018-05-22 ENCOUNTER — Emergency Department (HOSPITAL_BASED_OUTPATIENT_CLINIC_OR_DEPARTMENT_OTHER)
Admit: 2018-05-22 | Discharge: 2018-05-22 | Disposition: A | Discharge status: Home / Self Care | Payer: PRIVATE HEALTH INSURANCE | Attending: Emergency Medicine | Admitting: Emergency Medicine

## 2018-05-22 ENCOUNTER — Emergency Department (HOSPITAL_COMMUNITY)
Admission: EM | Admit: 2018-05-22 | Discharge: 2018-05-22 | Discharge status: Against Medical Advice | Payer: PRIVATE HEALTH INSURANCE | Attending: Emergency Medicine | Admitting: Emergency Medicine

## 2018-05-22 ENCOUNTER — Telehealth (INDEPENDENT_AMBULATORY_CARE_PROVIDER_SITE_OTHER): Payer: Self-pay

## 2018-05-22 DIAGNOSIS — M25571 Pain in right ankle and joints of right foot: Secondary | ICD-10-CM | POA: Diagnosis present

## 2018-05-22 DIAGNOSIS — Z5320 Procedure and treatment not carried out because of patient's decision for unspecified reasons: Secondary | ICD-10-CM | POA: Diagnosis not present

## 2018-05-22 DIAGNOSIS — M79609 Pain in unspecified limb: Secondary | ICD-10-CM

## 2018-05-22 DIAGNOSIS — M7989 Other specified soft tissue disorders: Secondary | ICD-10-CM

## 2018-05-22 DIAGNOSIS — R609 Edema, unspecified: Secondary | ICD-10-CM

## 2018-05-22 NOTE — ED Notes (Signed)
Paged doppler

## 2018-05-22 NOTE — ED Notes (Signed)
Spoke w/ vascular; vascular aware that pt in room [facility]

## 2018-05-22 NOTE — Telephone Encounter (Signed)
Patient would like to know what can she do about the swelling and the pain that she in having in her right ankle.  Stated that she has had swelling since Monday, 05/18/18.  Patient has appt.for 05/28/18.  Cb# is [phone].  Please advise.  Thank you.

## 2018-05-22 NOTE — ED Provider Notes (Signed)
Patient placed in Quick Look pathway, seen and evaluated   Chief Complaint: Right ankle pain and swelling  HPI:   Patient with history of right ankle surgery 2015, orthopedist Dr. Marlou Sa, presents for evaluation of progressively worsening right ankle swelling and pain.  Was on a flight to [place] on Monday returned today.  Flight approximately 2 hours or less.  Pain is constant throbbing, localized to the medial and lateral malleoli.  Endorses numbness to this region which she states is chronic secondary to her surgery.  Pain does not radiate.  She states that she experiences swelling intermittently to the right lower extremity after her surgery but not to this extent.  She called her orthopedist's office who recommended presentation to the ED for rule out of DVT.  She denies fevers, shortness of breath, chest pain.  No recent trauma or falls.  ROS: Positive for arthralgias, leg swelling, numbness, negative for fevers, chest pain, shortness of breath  Physical Exam:   Gen: No distress  Neuro: Awake and Alert  Skin: Warm    Focused Exam: 2+ DP/PT pulses bilaterally, mild tenderness to palpation overlying the right medial and lateral malleoli.  There are well-healed surgical scars noted to this area.  5/5 strength of BLE major muscle groups.  Altered sensation to soft touch of the right foot patient states this is chronic and unchanged.  Right calf measures 44 cm around the widest part, left calf measures 43 cm around the widest part.  Homans sign absent, no tenderness to palpation of the calves bilaterally.  No palpable cords.   Initiation of care has begun. The patient has been counseled on the process, plan, and necessity for staying for the completion/evaluation, and the remainder of the medical screening examination    Renita Papa, PA-C 05/22/18 1742    Lajean Saver, MD 05/22/18 1815

## 2018-05-22 NOTE — Telephone Encounter (Signed)
Spoke with Dr Marlou Sa, he suggested patient proceed to ER to r/o DVT. Spoke with patient and advised, she verbalized understanding.

## 2018-05-22 NOTE — ED Triage Notes (Signed)
Pt told by orthopedic MD (Dr. Marlou Sa)  that she should come to ed for swelling and pain to R ankle post flight; pt recently took trip to [facility], came back today around 2 pm. Pain since she flew to [facility] on Monday; pt has metal plate in R ankle

## 2018-05-22 NOTE — Progress Notes (Signed)
Preliminary notes--Right lower extremity venous duplex exam completed. Negative for DVT.  Hongying Landry Mellow (RDMS RVT) 05/22/18 6:21 PM

## 2018-05-22 NOTE — ED Notes (Signed)
Pt stated that she has a doctors appt in two days and feels like she will be ok.  I spoke with Mina-PA who looked at ultrasound results and stated that she could leave AMA an based on negative result.  Pt was informed and has chosen to leave.

## 2018-05-22 NOTE — Telephone Encounter (Signed)
Flew to [facility] on Monday and flew back today  Swelling since the flight Has been done some walking (less than 44mi a day) Calf tenderness Shortness of breath Red and tender to touch States skin appears to bubbled up. No birth control Will call Dr Marlou Sa to discuss.

## 2018-05-28 ENCOUNTER — Encounter (INDEPENDENT_AMBULATORY_CARE_PROVIDER_SITE_OTHER): Payer: Self-pay | Admitting: Orthopedic Surgery

## 2018-05-28 ENCOUNTER — Ambulatory Visit (INDEPENDENT_AMBULATORY_CARE_PROVIDER_SITE_OTHER): Payer: PRIVATE HEALTH INSURANCE | Admitting: Orthopedic Surgery

## 2018-05-28 ENCOUNTER — Ambulatory Visit (INDEPENDENT_AMBULATORY_CARE_PROVIDER_SITE_OTHER): Payer: PRIVATE HEALTH INSURANCE

## 2018-05-28 DIAGNOSIS — M25571 Pain in right ankle and joints of right foot: Secondary | ICD-10-CM | POA: Diagnosis not present

## 2018-05-28 NOTE — Progress Notes (Signed)
Office Visit Note   Patient: Ann Watson           Date of Birth: February 02, 1971           MRN: [id number] Visit Date: 05/28/2018 Requested by: Hulan Fess, MD [place], [place] [address] PCP: Hulan Fess, MD  Subjective: Chief Complaint  Patient presents with  . Right Ankle - Pain    HPI: Ann Watson is a 47 year old patient with right ankle pain.  She had ankle fixation performed in January 2015.  She states that the weather is affecting her ankle.  She reports primarily medial sided pain.  She is not taking too much in terms of anti-inflammatory but she is using some type of role on pain medicine.  She reports significant swelling over the past several weeks when she was down in [place].  She had a Doppler done in the emergency department 05/22/2018 which is negative for DVT.              ROS: All systems reviewed are negative as they relate to the chief complaint within the history of present illness.  Patient denies  fevers or chills.   Assessment & Plan: Visit Diagnoses:  1. Pain in right ankle and joints of right foot     Plan: Impression is right ankle pain which may be posttraumatic and arthritic in origin.  Compared to radiographs today 2 radiographs done at the time of surgery and there may be some narrowing of the ankle joint space.  No evidence of hardware complications particularly on the medial side.  No loosening or backing out of any screws.  Plan at this time is observation.  MRI scanning may not be that helpful due to the metallic artifact present.  Could consider injection into the joint itself.  Syndesmosis is stable symmetrically between ankles today.  Follow-up with me as needed.  No evidence of infection.  Follow-Up Instructions: Return if symptoms worsen or fail to improve.   Orders:  Orders Placed This Encounter  Procedures  . XR Ankle Complete Right   No orders of the defined types were placed in this encounter.     Procedures: No  procedures performed   Clinical Data: No additional findings.  Objective: Vital Signs: There were no vitals taken for this visit.  Physical Exam:   Constitutional: Patient appears well-developed HEENT:  Head: Normocephalic Eyes:EOM are normal Neck: Normal range of motion Cardiovascular: Normal rate Pulmonary/chest: Effort normal Neurologic: Patient is alert Skin: Skin is warm Psychiatric: Patient has normal mood and affect    Ortho Exam: Ortho exam demonstrates good range of motion of both ankles.  Incisions intact.  Slight swelling is present in the ankle joint itself right versus left.  No erythema redness or induration around the ankle joint.  The syndesmosis is stable bilaterally to stress.  Patient has slightly more medial sided tenderness and lateral sided tenderness on the affected ankle.  Incisions intact without erythema.  Specialty Comments:  No specialty comments available.  Imaging: Xr Ankle Complete Right  Result Date: 05/28/2018 AP lateral mortise right ankle reviewed.  Hardware in good position and alignment from bimalleolar ankle fracture.  Mortise appears symmetric.  No significant degenerative changes noted.  Old posterior malleolar fracture observed.    PMFS History: Patient Active Problem List   Diagnosis Date Noted  . Ankle fracture 01/15/2014  . Displaced bimalleolar fracture of right ankle 01/14/2014  . Anal fissure 12/20/2013  . Constipation, chronic 11/15/2013  . Internal  hemorrhoids with prolapse/pain 08/07/2011   Past Medical History:  Diagnosis Date  . Anal fissure 08/07/2011  . Chronic kidney disease    kidney stone extraction  . External hemorrhoid, thrombosed 08/07/2011  . Fatigue   . Hemorrhoids   . Obese     Family History  Problem Relation Age of Onset  . Diabetes Mother     Past Surgical History:  Procedure Laterality Date  . KIDNEY STONE SURGERY    . ORIF ANKLE FRACTURE Right 01/15/2014   Procedure: OPEN REDUCTION INTERNAL  FIXATION (ORIF) ANKLE FRACTURE;  Surgeon: Meredith Pel, MD;  Location: [facility];  Service: Orthopedics;  Laterality: Right;  OPEN REDUCTION INTERNAL FIXATION BI-MALLEOLAR ANKLE FRACTURE (SMITH-NEPHEW   Social History   Occupational History  . Not on file  Tobacco Use  . Smoking status: Former Smoker    Last attempt to quit: 08/10/2007    Years since quitting: 10.8  . Smokeless tobacco: Never Used  Substance and Sexual Activity  . Alcohol use: Yes    Comment: wine - weekly  . Drug use: No  . Sexual activity: Not on file

## 2018-08-05 NOTE — Patient Instructions (Addendum)
Your procedure is scheduled on:  Wednesday, 8/28  Enter through the Main Entrance of [facility] at: 12:30 pm  Pick up the phone at the desk and dial [phone].  Call this number if you have problems the morning of surgery: [phone].  Remember: Do NOT eat food after midnight Tuesday  Do NOT drink clear liquids (including water) after: 7:30 am Wednesday, day of surgery  Take these medicines the morning of surgery with a SIP OF WATER:  None  Stop herbal medications, vitamin supplements, Ibuprofen/NSAIDS 1 week prior to surgery (8/21)  Do NOT wear jewelry (body piercing), metal hair clips/bobby pins, make-up, or nail polish. Do NOT wear lotions, powders, or perfumes.  You may wear deoderant. Do NOT shave for 48 hours prior to surgery. Do NOT bring valuables to the hospital.  Have a responsible adult drive you home and stay with you for 24 hours after your procedure.  Home with Husband Ann Watson cell [phone].

## 2018-08-10 ENCOUNTER — Other Ambulatory Visit: Payer: Self-pay

## 2018-08-10 ENCOUNTER — Encounter (HOSPITAL_COMMUNITY): Payer: Self-pay

## 2018-08-10 ENCOUNTER — Encounter (HOSPITAL_COMMUNITY)
Admission: RE | Admit: 2018-08-10 | Discharge: 2018-08-10 | Disposition: A | Discharge status: Home / Self Care | Payer: PRIVATE HEALTH INSURANCE | Source: Ambulatory Visit | Attending: Obstetrics & Gynecology | Admitting: Obstetrics & Gynecology

## 2018-08-10 DIAGNOSIS — Z01812 Encounter for preprocedural laboratory examination: Secondary | ICD-10-CM | POA: Insufficient documentation

## 2018-08-10 HISTORY — DX: Herpesviral infection, unspecified: B00.9

## 2018-08-10 HISTORY — DX: Personal history of urinary calculi: Z87.442

## 2018-08-10 LAB — CBC
HCT: 39.4 % (ref 36.0–46.0)
HEMOGLOBIN: 12.7 g/dL (ref 12.0–15.0)
MCH: 27.6 pg (ref 26.0–34.0)
MCHC: 32.2 g/dL (ref 30.0–36.0)
MCV: 85.7 fL (ref 78.0–100.0)
Platelets: 274 10*3/uL (ref 150–400)
RBC: 4.6 MIL/uL (ref 3.87–5.11)
RDW: 14.7 % (ref 11.5–15.5)
WBC: 11.4 10*3/uL — AB (ref 4.0–10.5)

## 2018-08-10 LAB — COMPREHENSIVE METABOLIC PANEL
ALBUMIN: 3.8 g/dL (ref 3.5–5.0)
ALK PHOS: 78 U/L (ref 38–126)
ALT: 18 U/L (ref 0–44)
AST: 19 U/L (ref 15–41)
Anion gap: 10 (ref 5–15)
BUN: 13 mg/dL (ref 6–20)
CALCIUM: 8.8 mg/dL — AB (ref 8.9–10.3)
CO2: 23 mmol/L (ref 22–32)
CREATININE: 0.91 mg/dL (ref 0.44–1.00)
Chloride: 104 mmol/L (ref 98–111)
GFR calc Af Amer: >60 mL/min (ref >60)
GFR calc non Af Amer: >60 mL/min (ref >60)
GLUCOSE: 99 mg/dL (ref 70–99)
Potassium: 3.7 mmol/L (ref 3.5–5.1)
SODIUM: 137 mmol/L (ref 135–145)
Total Bilirubin: 0.5 mg/dL (ref 0.3–1.2)
Total Protein: 6.7 g/dL (ref 6.5–8.1)

## 2018-08-12 ENCOUNTER — Other Ambulatory Visit (HOSPITAL_COMMUNITY): Payer: PRIVATE HEALTH INSURANCE

## 2018-08-14 NOTE — H&P (Signed)
47yo G1P1 who presents for hysteroscopy, D&C, HTA ablation due to heavy menstrual bleeding.  She reports that this has been a chronic issue- at least for the past 4 years. Menses are regular each month, but very heavy. Start with light spotting for a few days (she jokingly callled that the warm- up) it will then stop and then a few days later she will have her full period. Menses will last for 8 days with 3 heavy days where she will change her cup up to 10x per day. Reports passage of clots and significant dysmenorrhea. Takes pamprin to ease the pain- rates her pain 8/10. Denies intermenstrual bleeding. Denies pelvic or abdominal pain any other time. Denies abnormal discharge, itching or irritation.  After review of her options, she desires ablation.  Work up: [place] 07/16/2018: 6.8cm anteverted uterus with 4 small fibroids- largest 3.2 ant submucosal fibroid.  Normal right ovary.  Left ovary with 2.4cm complex cyst- likely hemorrhagic cyst, avascular     Vital Signs  Wt 237.5, Wt change 1.5 lb, Ht 64, BMI 40.76, Pulse sitting 78, BP sitting 130/90.   Examination  General Examination: CONSTITUTIONAL: well nourished, well developed.  SKIN: warm & dry, no rashes.  NECK: supple, normal appearance, thyroid not enlarged.  BREASTS: no palpable masses bilaterally, no nipple discharge bilaterally.  LUNGS: clear to auscultation bilaterally, no wheezes, rhonchi, rales.  HEART: no murmurs, regular rate and rhythm.  ABDOMEN: obese, uterus feels enlarged- below umbilicus,soft and not tender, no rebound, no guarding.  FEMALE GENITOURINARY: Normal urethra, No external lesions, Vagina - pink moist mucosa, no lesions or abnormal discharge,cervix - visualized, no discharge or lesions. No CMT. No adnexal masses bilaterally. Uterus: nontender, difficult to assess on bimanual exam.  EXTREMITIES: no edema present, no calf tenderness bilaterally.  LYMPH NODES: no inguinal adenopathy, no axillary adenopathy.  PSYCH:  oriented x 3, appropriate mood and affect.     A/P: 47yo G1P1 who presents for hysteroscopy, D&C, ablation due to heavy menstrual bleeding -NPO -LR @125cc /hr -SCDs to OR -Risk/benefit and indications reviewed including but not limited to risk of bleeding, infection and injury to surrounding organs.  Also discussed potential failure of device and further evaluation and work up pending results of pathology.  Pt aware and desires to proceed  Janyth Pupa, DO [phone] (cell) [phone] (office)

## 2018-08-19 ENCOUNTER — Ambulatory Visit (HOSPITAL_COMMUNITY)
Admission: AD | Admit: 2018-08-19 | Discharge: 2018-08-19 | Disposition: A | Discharge status: Home / Self Care | Payer: No Typology Code available for payment source | Source: Ambulatory Visit | Attending: Obstetrics & Gynecology | Admitting: Obstetrics & Gynecology

## 2018-08-19 ENCOUNTER — Encounter (HOSPITAL_COMMUNITY)
Admission: AD | Disposition: A | Discharge status: Home / Self Care | Payer: Self-pay | Source: Ambulatory Visit | Attending: Obstetrics & Gynecology

## 2018-08-19 ENCOUNTER — Ambulatory Visit (HOSPITAL_COMMUNITY): Payer: No Typology Code available for payment source | Admitting: Anesthesiology

## 2018-08-19 ENCOUNTER — Encounter (HOSPITAL_COMMUNITY): Payer: Self-pay

## 2018-08-19 ENCOUNTER — Other Ambulatory Visit: Payer: Self-pay

## 2018-08-19 DIAGNOSIS — Z87891 Personal history of nicotine dependence: Secondary | ICD-10-CM | POA: Diagnosis not present

## 2018-08-19 DIAGNOSIS — N920 Excessive and frequent menstruation with regular cycle: Secondary | ICD-10-CM | POA: Insufficient documentation

## 2018-08-19 HISTORY — PX: DILITATION & CURRETTAGE/HYSTROSCOPY WITH HYDROTHERMAL ABLATION: SHX5570

## 2018-08-19 LAB — PREGNANCY, URINE: Preg Test, Ur: NEGATIVE

## 2018-08-19 SURGERY — DILATATION & CURETTAGE/HYSTEROSCOPY WITH HYDROTHERMAL ABLATION
Anesthesia: General | Site: Vagina

## 2018-08-19 MED ORDER — ONDANSETRON HCL 4 MG/2ML IJ SOLN
INTRAMUSCULAR | Status: DC | PRN
  Administered 2018-08-19: 4 mg via INTRAVENOUS

## 2018-08-19 MED ORDER — KETOROLAC TROMETHAMINE 30 MG/ML IJ SOLN
INTRAMUSCULAR | Status: AC
  Filled 2018-08-19: qty 1

## 2018-08-19 MED ORDER — OXYCODONE HCL 5 MG PO TABS: 5.0000 mg | ORAL_TABLET | Freq: Once | ORAL | Status: DC

## 2018-08-19 MED ORDER — HYDROMORPHONE HCL 1 MG/ML IJ SOLN
INTRAMUSCULAR | Status: AC
  Administered 2018-08-19: 0.5 mg via INTRAVENOUS
  Filled 2018-08-19: qty 0.5

## 2018-08-19 MED ORDER — FENTANYL CITRATE (PF) 100 MCG/2ML IJ SOLN
INTRAMUSCULAR | Status: DC | PRN
  Administered 2018-08-19 (×2): 50 ug via INTRAVENOUS

## 2018-08-19 MED ORDER — KETOROLAC TROMETHAMINE 30 MG/ML IJ SOLN
INTRAMUSCULAR | Status: DC | PRN
  Administered 2018-08-19: 30 mg via INTRAVENOUS

## 2018-08-19 MED ORDER — ONDANSETRON HCL 4 MG/2ML IJ SOLN
INTRAMUSCULAR | Status: AC
  Filled 2018-08-19: qty 2

## 2018-08-19 MED ORDER — MIDAZOLAM HCL 2 MG/2ML IJ SOLN
INTRAMUSCULAR | Status: DC | PRN
  Administered 2018-08-19: 2 mg via INTRAVENOUS

## 2018-08-19 MED ORDER — BUPIVACAINE-EPINEPHRINE 0.25% -1:200000 IJ SOLN
INTRAMUSCULAR | Status: DC | PRN
  Administered 2018-08-19: 20 mL

## 2018-08-19 MED ORDER — LIDOCAINE HCL (CARDIAC) PF 100 MG/5ML IV SOSY
PREFILLED_SYRINGE | INTRAVENOUS | Status: AC
  Filled 2018-08-19: qty 5

## 2018-08-19 MED ORDER — IBUPROFEN 600 MG PO TABS: 600.0000 mg | ORAL_TABLET | Freq: Four times a day (QID) | ORAL | 0 refills | Status: DC | PRN

## 2018-08-19 MED ORDER — HYDROMORPHONE HCL 1 MG/ML IJ SOLN
INTRAMUSCULAR | Status: AC
  Administered 2018-08-19: 0.5 mg via INTRAVENOUS
  Filled 2018-08-19: qty 1

## 2018-08-19 MED ORDER — DEXAMETHASONE SODIUM PHOSPHATE 10 MG/ML IJ SOLN
INTRAMUSCULAR | Status: DC | PRN
  Administered 2018-08-19: 4 mg via INTRAVENOUS

## 2018-08-19 MED ORDER — ACETAMINOPHEN 10 MG/ML IV SOLN
1000.0000 mg | Freq: Four times a day (QID) | INTRAVENOUS | Status: DC
  Administered 2018-08-19: 1000 mg via INTRAVENOUS

## 2018-08-19 MED ORDER — PROPOFOL 10 MG/ML IV BOLUS
INTRAVENOUS | Status: DC | PRN
  Administered 2018-08-19: 200 mg via INTRAVENOUS

## 2018-08-19 MED ORDER — LACTATED RINGERS IV SOLN: INTRAVENOUS | Status: DC

## 2018-08-19 MED ORDER — BUPIVACAINE-EPINEPHRINE (PF) 0.25% -1:200000 IJ SOLN
INTRAMUSCULAR | Status: AC
  Filled 2018-08-19: qty 30

## 2018-08-19 MED ORDER — ACETAMINOPHEN 10 MG/ML IV SOLN
INTRAVENOUS | Status: AC
  Administered 2018-08-19: 1000 mg via INTRAVENOUS
  Filled 2018-08-19: qty 100

## 2018-08-19 MED ORDER — LIDOCAINE HCL (CARDIAC) PF 100 MG/5ML IV SOSY
PREFILLED_SYRINGE | INTRAVENOUS | Status: DC | PRN
  Administered 2018-08-19: 100 mg via INTRAVENOUS

## 2018-08-19 MED ORDER — SCOPOLAMINE 1 MG/3DAYS TD PT72
1.0000 | MEDICATED_PATCH | Freq: Once | TRANSDERMAL | Status: DC
  Administered 2018-08-19: 1.5 mg via TRANSDERMAL

## 2018-08-19 MED ORDER — METOCLOPRAMIDE HCL 5 MG/ML IJ SOLN
INTRAMUSCULAR | Status: AC
  Filled 2018-08-19: qty 2

## 2018-08-19 MED ORDER — SCOPOLAMINE 1 MG/3DAYS TD PT72
MEDICATED_PATCH | TRANSDERMAL | Status: AC
  Administered 2018-08-19: 1.5 mg via TRANSDERMAL
  Filled 2018-08-19: qty 1

## 2018-08-19 MED ORDER — FENTANYL CITRATE (PF) 100 MCG/2ML IJ SOLN
INTRAMUSCULAR | Status: AC
  Filled 2018-08-19: qty 2

## 2018-08-19 MED ORDER — LACTATED RINGERS IV SOLN
INTRAVENOUS | Status: DC
  Administered 2018-08-19 (×2): 125 mL/h via INTRAVENOUS

## 2018-08-19 MED ORDER — SODIUM CHLORIDE 0.9 % IR SOLN
Status: DC | PRN
  Administered 2018-08-19: 3000 mL

## 2018-08-19 MED ORDER — MIDAZOLAM HCL 2 MG/2ML IJ SOLN
INTRAMUSCULAR | Status: AC
  Filled 2018-08-19: qty 2

## 2018-08-19 MED ORDER — PROPOFOL 10 MG/ML IV BOLUS
INTRAVENOUS | Status: AC
  Filled 2018-08-19: qty 20

## 2018-08-19 MED ORDER — HYDROMORPHONE HCL 1 MG/ML IJ SOLN
0.2500 mg | INTRAMUSCULAR | Status: DC | PRN
Start: 2018-08-19 — End: 2018-08-19
  Administered 2018-08-19 (×3): 0.5 mg via INTRAVENOUS

## 2018-08-19 MED ORDER — DEXAMETHASONE SODIUM PHOSPHATE 4 MG/ML IJ SOLN
INTRAMUSCULAR | Status: AC
  Filled 2018-08-19: qty 1

## 2018-08-19 MED ORDER — METOCLOPRAMIDE HCL 5 MG/ML IJ SOLN
10.0000 mg | Freq: Once | INTRAMUSCULAR | Status: AC
  Administered 2018-08-19: 10 mg via INTRAVENOUS

## 2018-08-19 SURGICAL SUPPLY — 11 items
CANISTER SUCT 3000ML PPV (MISCELLANEOUS) ×3 IMPLANT
CATH ROBINSON RED A/P 16FR (CATHETERS) ×3 IMPLANT
DILATOR CANAL MILEX (MISCELLANEOUS) ×3 IMPLANT
GLOVE BIOGEL PI IND STRL 7.0 (GLOVE) ×2 IMPLANT
GLOVE BIOGEL PI INDICATOR 7.0 (GLOVE) ×4
GLOVE ECLIPSE 6.5 STRL STRAW (GLOVE) ×3 IMPLANT
GOWN STRL REUS W/TWL LRG LVL3 (GOWN DISPOSABLE) ×6 IMPLANT
PACK VAGINAL MINOR WOMEN LF (CUSTOM PROCEDURE TRAY) ×3 IMPLANT
PAD OB MATERNITY 4.3X12.25 (PERSONAL CARE ITEMS) ×3 IMPLANT
SET GENESYS HTA PROCERVA (MISCELLANEOUS) ×3 IMPLANT
TOWEL OR 17X24 6PK STRL BLUE (TOWEL DISPOSABLE) ×6 IMPLANT

## 2018-08-19 NOTE — Anesthesia Preprocedure Evaluation (Addendum)
Anesthesia Evaluation  Patient identified by MRN, date of birth, ID band Patient awake    Reviewed: Allergy & Precautions, NPO status , Patient's Chart, lab work & pertinent test results  Airway Mallampati: II  TM Distance: >3 FB Neck ROM: Full    Dental no notable dental hx. (+) Teeth Intact, Dental Advisory Given   Pulmonary neg pulmonary ROS, former smoker,    Pulmonary exam normal breath sounds clear to auscultation       Cardiovascular negative cardio ROS Normal cardiovascular exam Rhythm:Regular Rate:Normal     Neuro/Psych negative neurological ROS  negative psych ROS   GI/Hepatic negative GI ROS, Neg liver ROS,   Endo/Other  negative endocrine ROS  Renal/GU negative Renal ROS  negative genitourinary   Musculoskeletal negative musculoskeletal ROS (+)   Abdominal   Peds  Hematology negative hematology ROS (+)   Anesthesia Other Findings Abnormal uterine bleeding  Reproductive/Obstetrics                            Anesthesia Physical Anesthesia Plan  ASA: II  Anesthesia Plan: General   Post-op Pain Management:    Induction: Intravenous  PONV Risk Score and Plan: 3 and Dexamethasone, Ondansetron, Midazolam and Scopolamine patch - Pre-op  Airway Management Planned: LMA  Additional Equipment:   Intra-op Plan:   Post-operative Plan: Extubation in OR  Informed Consent: I have reviewed the patients History and Physical, chart, labs and discussed the procedure including the risks, benefits and alternatives for the proposed anesthesia with the patient or authorized representative who has indicated his/her understanding and acceptance.   Dental advisory given  Plan Discussed with: CRNA  Anesthesia Plan Comments:        Anesthesia Quick Evaluation

## 2018-08-19 NOTE — Transfer of Care (Signed)
Immediate Anesthesia Transfer of Care Note  Patient: Ann Watson  Procedure(s) Performed: DILATATION & CURETTAGE/HYSTEROSCOPY WITH HYDROTHERMAL ABLATION (N/A Vagina )  Patient Location: PACU  Anesthesia Type:General  Level of Consciousness: awake, alert  and oriented  Airway & Oxygen Therapy: Patient Spontanous Breathing and Patient connected to nasal cannula oxygen  Post-op Assessment: Report given to RN and Post -op Vital signs reviewed and stable  Post vital signs: Reviewed and stable  Last Vitals:  Vitals Value Taken Time  BP 145/86 08/19/2018  4:02 PM  Temp    Pulse 72 08/19/2018  4:04 PM  Resp 20 08/19/2018  4:04 PM  SpO2 97 % 08/19/2018  4:04 PM  Vitals shown include unvalidated device data.  Last Pain:  Vitals:   08/19/18 1240  TempSrc: Oral  PainSc: 1       Patients Stated Pain Goal: 4 (53/20/23 3435)  Complications: No apparent anesthesia complications

## 2018-08-19 NOTE — Anesthesia Procedure Notes (Signed)
Procedure Name: LMA Insertion Date/Time: 08/19/2018 3:23 PM Performed by: Riki Sheer, CRNA Pre-anesthesia Checklist: Patient identified, Emergency Drugs available, Suction available, Patient being monitored and Timeout performed Patient Re-evaluated:Patient Re-evaluated prior to induction Oxygen Delivery Method: Circle system utilized Preoxygenation: Pre-oxygenation with 100% oxygen Induction Type: IV induction Ventilation: Mask ventilation without difficulty LMA: LMA inserted LMA Size: 4.0 Number of attempts: 1 Placement Confirmation: positive ETCO2,  CO2 detector and breath sounds checked- equal and bilateral Tube secured with: Tape Dental Injury: Teeth and Oropharynx as per pre-operative assessment

## 2018-08-19 NOTE — Anesthesia Postprocedure Evaluation (Signed)
Anesthesia Post Note  Patient: Dianna Rossetti  Procedure(s) Performed: DILATATION & CURETTAGE/HYSTEROSCOPY WITH HYDROTHERMAL ABLATION (N/A Vagina )     Patient location during evaluation: PACU Anesthesia Type: General Level of consciousness: awake and alert Pain management: pain level controlled Vital Signs Assessment: post-procedure vital signs reviewed and stable Respiratory status: spontaneous breathing, nonlabored ventilation, respiratory function stable and patient connected to nasal cannula oxygen Cardiovascular status: blood pressure returned to baseline and stable Postop Assessment: no apparent nausea or vomiting Anesthetic complications: no    Last Vitals:  Vitals:   08/19/18 1715 08/19/18 1727  BP: (!) 147/82 138/80  Pulse: 60 66  Resp: 13 15  Temp:    SpO2: 94% 95%    Last Pain:  Vitals:   08/19/18 1715  TempSrc:   PainSc: 3                  Amberrose Friebel L Elyon Zoll

## 2018-08-19 NOTE — Op Note (Signed)
Operative Report  PreOp: abnormal uterine bleeding PostOp: same Procedure:  Hysteroscopy, Dilation and Curettage, Endometrial ablation Surgeon: Dr. Janyth Pupa Anesthesia: General Complications:none EBL: Minimal  Findings:7cm anteverted uterus with proliferative endometrium Specimens: endometrial curettings  Procedure: The patient was taken to the operating room where she underwent general anesthesia without difficulty. The patient was placed in a low lithotomy position using Allen stirrups. The patient was examined with the findings as noted above.  She was then prepped and draped in the normal sterile fashion. The bladder was drained using a red rubber urethral catheter. A sterile speculum was inserted into the vagina. A single tooth tenaculum was placed on the anterior lip of the cervix. Cervical block was performed.  The uterus was then sounded to 7cm. The diagnostic hysteroscope was then inserted without difficulty and noted to have the findings as listed above. The hysteroscope was removed and sharp curettage was performed. The tissue was sent to pathology.   Attention was then turned to the HTA system. The system was set up according to manufacture instructions and ablation was performed.  No uterine perforation were seen. All instrument were then removed. Hemostasis was observed at the cervical site.  The patient was repositioned to the supine position. The patient tolerated the procedure without any complications and taken to recovery in stable condition.   Janyth Pupa, DO [phone] (pager) [phone] (office)

## 2018-08-19 NOTE — Discharge Instructions (Addendum)
HOME INSTRUCTIONS  Please note any unusual or excessive bleeding, pain, swelling. Mild dizziness or drowsiness are normal for about 24 hours after surgery.   Shower when comfortable  Restrictions: No driving for 24 hours or while taking pain medications.  Activity:  No heavy lifting (> 10 lbs), nothing in vagina (no tampons, douching, or intercourse) x 2 weeks; no tub baths for 2 weeks Vaginal spotting is expected but if your bleeding is heavy, period like,  please call the office   Diet:  You may return to your regular diet.  Do not eat large meals.  Eat small frequent meals throughout the day.  Continue to drink a good amount of water at least 6-8 glasses of water per day, hydration is very important for the healing process.  Pain Management: Take over the counter Ibuprofen or tylenol as needed  Alcohol -- Avoid for 24 hours and while taking pain medications.  Nausea: Take sips of ginger ale or soda  Fever -- Call physician if temperature over 101 degrees  Follow up:  If you do not already have a follow up appointment scheduled, please call the office at [phone].  If you experience fever (a temperature greater than 100.4), pain unrelieved by pain medication, shortness of breath, swelling of a single leg, or any other symptoms which are concerning to you please the office immediately.  DISCHARGE INSTRUCTIONS: HYSTEROSCOPY / ENDOMETRIAL ABLATION The following instructions have been prepared to help you care for yourself upon your return home.  May Remove Scop patch on or before  May take Ibuprofen after  May take stool softner while taking narcotic pain medication to prevent constipation.  Drink plenty of water.  Personal hygiene:  Use sanitary pads for vaginal drainage, not tampons.  Shower the day after your procedure.  NO tub baths, pools or Jacuzzis for 2-3 weeks.  Wipe front to back after using the bathroom.  Activity and limitations:  Do NOT drive or operate any  equipment for 24 hours. The effects of anesthesia are still present and drowsiness may result.  Do NOT rest in bed all day.  Walking is encouraged.  Walk up and down stairs slowly.  You may resume your normal activity in one to two days or as indicated by your physician. Sexual activity: NO intercourse for at least 2 weeks after the procedure, or as indicated by your Doctor.  Diet: Eat a light meal as desired this evening. You may resume your usual diet tomorrow.  Return to Work: You may resume your work activities in one to two days or as indicated by Marine scientist.  What to expect after your surgery: Expect to have vaginal bleeding/discharge for 2-3 days and spotting for up to 10 days. It is not unusual to have soreness for up to 1-2 weeks. You may have a slight burning sensation when you urinate for the first day. Mild cramps may continue for a couple of days. You may have a regular period in 2-6 weeks.  Call your doctor for any of the following:  Excessive vaginal bleeding or clotting, saturating and changing one pad every hour.  Inability to urinate 6 hours after discharge from hospital.  Pain not relieved by pain medication.  Fever of 100.4 F or greater.  Unusual vaginal discharge or odor.   Post Anesthesia Home Care Instructions  Activity: Get plenty of rest for the remainder of the day. A responsible individual must stay with you for 24 hours following the procedure.  For the next  24 hours, DO NOT: -Drive a car -Paediatric nurse -Drink alcoholic beverages -Take any medication unless instructed by your physician -Make any legal decisions or sign important papers.  Meals: Start with liquid foods such as gelatin or soup. Progress to regular foods as tolerated. Avoid greasy, spicy, heavy foods. If nausea and/or vomiting occur, drink only clear liquids until the nausea and/or vomiting subsides. Call your physician if vomiting continues.  Special  Instructions/Symptoms: Your throat may feel dry or sore from the anesthesia or the breathing tube placed in your throat during surgery. If this causes discomfort, gargle with warm salt water. The discomfort should disappear within 24 hours.  If you had a scopolamine patch placed behind your ear for the management of post- operative nausea and/or vomiting:  1. The medication in the patch is effective for 72 hours, after which it should be removed.  Wrap patch in a tissue and discard in the trash. Wash hands thoroughly with soap and water. 2. You may remove the patch earlier than 72 hours if you experience unpleasant side effects which may include dry mouth, dizziness or visual disturbances. 3. Avoid touching the patch. Wash your hands with soap and water after contact with the patch.

## 2018-08-19 NOTE — Interval H&P Note (Signed)
History and Physical Interval Note:  08/19/2018 2:23 PM  Ann Watson  has presented today for surgery, with the diagnosis of N92.0 menorrhagia with regular cycle  The various methods of treatment have been discussed with the patient and family. After consideration of risks, benefits and other options for treatment, the patient has consented to  Procedure(s): DILATATION & CURETTAGE/HYSTEROSCOPY WITH HYDROTHERMAL ABLATION (N/A) as a surgical intervention .  The patient's history has been reviewed, patient examined, no change in status, stable for surgery.  I have reviewed the patient's chart and labs.  Questions were answered to the patient's satisfaction.     [name]

## 2018-08-20 ENCOUNTER — Encounter (HOSPITAL_COMMUNITY): Payer: Self-pay | Admitting: Obstetrics & Gynecology

## 2018-09-10 DIAGNOSIS — H903 Sensorineural hearing loss, bilateral: Secondary | ICD-10-CM | POA: Insufficient documentation

## 2018-09-14 ENCOUNTER — Encounter (HOSPITAL_COMMUNITY): Payer: Self-pay | Admitting: Emergency Medicine

## 2018-09-14 ENCOUNTER — Ambulatory Visit (HOSPITAL_COMMUNITY)
Admission: EM | Admit: 2018-09-14 | Discharge: 2018-09-14 | Disposition: A | Discharge status: Home / Self Care | Payer: PRIVATE HEALTH INSURANCE | Attending: Family Medicine | Admitting: Family Medicine

## 2018-09-14 DIAGNOSIS — M545 Low back pain, unspecified: Secondary | ICD-10-CM

## 2018-09-14 MED ORDER — KETOROLAC TROMETHAMINE 30 MG/ML IJ SOLN
30.0000 mg | Freq: Once | INTRAMUSCULAR | Status: AC
  Administered 2018-09-14: 30 mg via INTRAMUSCULAR

## 2018-09-14 MED ORDER — CYCLOBENZAPRINE HCL 10 MG PO TABS: 5.0000 mg | ORAL_TABLET | Freq: Every day | ORAL | 0 refills | Status: DC

## 2018-09-14 MED ORDER — MELOXICAM 7.5 MG PO TABS: 7.5000 mg | ORAL_TABLET | Freq: Every day | ORAL | 0 refills | Status: DC

## 2018-09-14 MED ORDER — KETOROLAC TROMETHAMINE 30 MG/ML IJ SOLN
INTRAMUSCULAR | Status: AC
  Filled 2018-09-14: qty 1

## 2018-09-14 NOTE — Discharge Instructions (Addendum)
It was nice meeting you!!  I believe that you have pulled a muscle in your back. We are giving you a Toradol injection in clinic here for pain I am sending some medications to the pharmacy Be aware the muscle relaxant may make you drowsy Heat/ice and gentle massage and stretch could help Follow-up as needed for worsening symptoms.

## 2018-09-14 NOTE — ED Triage Notes (Signed)
Pt states she was putting her pants on and pulled her back out.
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
## 2018-09-25 ENCOUNTER — Emergency Department (HOSPITAL_COMMUNITY): Payer: No Typology Code available for payment source

## 2018-09-25 ENCOUNTER — Emergency Department (HOSPITAL_COMMUNITY)
Admission: EM | Admit: 2018-09-25 | Discharge: 2018-09-26 | Disposition: A | Discharge status: Home / Self Care | Payer: No Typology Code available for payment source | Attending: Emergency Medicine | Admitting: Emergency Medicine

## 2018-09-25 ENCOUNTER — Encounter (HOSPITAL_COMMUNITY): Payer: Self-pay | Admitting: Nurse Practitioner

## 2018-09-25 DIAGNOSIS — W270XXA Contact with workbench tool, initial encounter: Secondary | ICD-10-CM | POA: Insufficient documentation

## 2018-09-25 DIAGNOSIS — Y929 Unspecified place or not applicable: Secondary | ICD-10-CM | POA: Diagnosis not present

## 2018-09-25 DIAGNOSIS — S61210A Laceration without foreign body of right index finger without damage to nail, initial encounter: Secondary | ICD-10-CM | POA: Diagnosis not present

## 2018-09-25 DIAGNOSIS — Z79899 Other long term (current) drug therapy: Secondary | ICD-10-CM | POA: Diagnosis not present

## 2018-09-25 DIAGNOSIS — Y999 Unspecified external cause status: Secondary | ICD-10-CM | POA: Insufficient documentation

## 2018-09-25 DIAGNOSIS — Y9389 Activity, other specified: Secondary | ICD-10-CM | POA: Diagnosis not present

## 2018-09-25 MED ORDER — LIDOCAINE HCL 2 % IJ SOLN
20.0000 mL | Freq: Once | INTRAMUSCULAR | Status: AC
  Administered 2018-09-25: 400 mg
  Filled 2018-09-25: qty 20

## 2018-09-25 NOTE — ED Notes (Addendum)
Sterile gauze replaced w/ telfa gauze.  Medication at bedside.

## 2018-09-25 NOTE — ED Notes (Signed)
ED Provider at bedside. 

## 2018-09-25 NOTE — ED Notes (Signed)
Bed: WA23 Expected date:  Expected time:  Means of arrival:  Comments: [facility] 

## 2018-09-25 NOTE — ED Triage Notes (Signed)
Pt presents with an open fresh cut laceration to the right index finger, bleeding seems to be controlled at this time.

## 2018-09-25 NOTE — ED Provider Notes (Signed)
[facility] DEPT Provider Note   CSN: [id number] Arrival date & time: 09/25/18  2118     History   Chief Complaint Chief Complaint  Patient presents with  . Finger Injury    Right index finger    HPI Ann Watson is a 47 y.o. female.  HPI Patient presents to the emergency department with a finger laceration that the patient states she was working with wood and these sanding wheel somehow caught her finger and cut it.  Patient states that her tetanus shot is up-to-date.  Patient states that she has good range of motion and sensation of the finger.  Patient denies any other injuries. Past Medical History:  Diagnosis Date  . Anal fissure 08/07/2011  . External hemorrhoid, thrombosed 08/07/2011  . Fatigue   . Hemorrhoids   . History of kidney stones    surgery to remove  . HSV infection   . Obese   . SVD (spontaneous vaginal delivery)    x 1    Patient Active Problem List   Diagnosis Date Noted  . Ankle fracture 01/15/2014  . Displaced bimalleolar fracture of right ankle 01/14/2014  . Anal fissure 12/20/2013  . Constipation, chronic 11/15/2013  . Internal hemorrhoids with prolapse/pain 08/07/2011    Past Surgical History:  Procedure Laterality Date  . DILATION AND CURETTAGE OF UTERUS     x 1   . DILITATION & CURRETTAGE/HYSTROSCOPY WITH HYDROTHERMAL ABLATION N/A 08/19/2018   Procedure: DILATATION & CURETTAGE/HYSTEROSCOPY WITH HYDROTHERMAL ABLATION;  Surgeon: Janyth Pupa, DO;  Location: [facility];  Service: Gynecology;  Laterality: N/A;  . KIDNEY STONE SURGERY    . MULTIPLE TOOTH EXTRACTIONS     poor dental  . ORIF ANKLE FRACTURE Right 01/15/2014   Procedure: OPEN REDUCTION INTERNAL FIXATION (ORIF) ANKLE FRACTURE;  Surgeon: Meredith Pel, MD;  Location: [facility];  Service: Orthopedics;  Laterality: Right;  OPEN REDUCTION INTERNAL FIXATION BI-MALLEOLAR ANKLE FRACTURE (SMITH-NEPHEW     OB History   None      Home Medications     Prior to Admission medications   Medication Sig Start Date End Date Taking? Authorizing Provider  EPINEPHrine 0.3 mg/0.3 mL IJ SOAJ injection Inject 0.3 mg into the muscle once as needed. Allergic reaction 06/25/18  Yes [provider]  naproxen sodium (ALEVE) 220 MG tablet Take 660 mg by mouth 2 (two) times daily as needed (pain).   Yes [provider]  phentermine 37.5 MG capsule Take 37.5 mg by mouth daily.  03/09/18  Yes [provider]  cyclobenzaprine (FLEXERIL) 10 MG tablet Take 0.5 tablets (5 mg total) by mouth at bedtime. Patient not taking: Reported on 09/25/2018 09/14/18   Loura Halt A, NP  ibuprofen (ADVIL,MOTRIN) 600 MG tablet Take 1 tablet (600 mg total) by mouth every 6 (six) hours as needed. Patient not taking: Reported on 09/25/2018 08/19/18   Janyth Pupa, DO  meloxicam (MOBIC) 7.5 MG tablet Take 1 tablet (7.5 mg total) by mouth daily. Patient not taking: Reported on 09/25/2018 09/14/18   Orvan July, NP    Family History Family History  Problem Relation Age of Onset  . Diabetes Mother     Social History Social History   Tobacco Use  . Smoking status: Former Smoker    Packs/day: 1.50    Years: 23.00    Pack years: 34.50    Types: Cigarettes    Last attempt to quit: 08/10/2007    Years since quitting: 11.1  . Smokeless  tobacco: Never Used  Substance Use Topics  . Alcohol use: Yes    Alcohol/week: 4.0 - 5.0 standard drinks    Types: 4 - 5 Glasses of wine per week    Comment: wine - weekly  . Drug use: No     Allergies   Bee venom; Kiwi extract; and Mango flavor   Review of Systems Review of Systems All other systems negative except as documented in the HPI. All pertinent positives and negatives as reviewed in the HPI.  Physical Exam Updated Vital Signs BP (!) 169/110 (BP Location: Left Arm)   Pulse 100   Temp 98.6 F (37 C)   Resp 18   LMP 09/06/2018   SpO2 97%   Physical Exam  Constitutional: She is oriented to  person, place, and time. She appears well-developed and well-nourished. No distress.  HENT:  Head: Normocephalic and atraumatic.  Eyes: Pupils are equal, round, and reactive to light.  Pulmonary/Chest: Effort normal.  Musculoskeletal:       Hands: Neurological: She is alert and oriented to person, place, and time.  Skin: Skin is warm and dry.  Psychiatric: She has a normal mood and affect.  Nursing note and vitals reviewed.    ED Treatments / Results  Labs (all labs ordered are listed, but only abnormal results are displayed) Labs Reviewed - No data to display  EKG None  Radiology Dg Finger Index Right  Result Date: 09/25/2018 CLINICAL DATA:  Laceration to the index finger EXAM: RIGHT INDEX FINGER 2+V COMPARISON:  None. FINDINGS: There is no evidence of fracture or dislocation. There is no evidence of arthropathy or other focal bone abnormality. Laceration volar aspect of the second digit at the DIP joint. No radiopaque foreign body. IMPRESSION: No acute osseous abnormality Electronically Signed   By: Donavan Foil M.D.   On: 09/25/2018 23:06    Procedures Procedures (including critical care time)  Medications Ordered in ED Medications  lidocaine (XYLOCAINE) 2 % (with pres) injection 400 mg (has no administration in time range)     Initial Impression / Assessment and Plan / ED Course  I have reviewed the triage vital signs and the nursing notes.  Pertinent labs & imaging results that were available during my care of the patient were reviewed by me and considered in my medical decision making (see chart for details).    LACERATION REPAIR Performed by: Brent General Authorized by: Resa Miner Kashmir Leedy Consent: Verbal consent obtained. Risks and benefits: risks, benefits and alternatives were discussed Consent given by: patient Patient identity confirmed: provided demographic data Prepped and Draped in normal sterile fashion Wound explored  Laceration  Location:  R index finger  Laceration Length: 2cm  No Foreign Bodies seen or palpated  Anesthesia: local infiltration  Local anesthetic: lidocaine 2% wo epinephrine  Anesthetic total: 6 ml  Irrigation method: syringe Amount of cleaning: standard  Skin closure: 4-0 Prolene  Number of sutures: 5  Technique: simple interrupted  Patient tolerance: Patient tolerated the procedure well with no immediate complications.  Finger splint applied for protection. Advised to have sutures out in 14 days. Keep area clean and dry  Final Clinical Impressions(s) / ED Diagnoses   Final diagnoses:  None    ED Discharge Orders    None       Dalia Heading, PA-C 09/26/18 0005    Maudie Flakes, MD 09/26/18 2236

## 2018-09-26 MED ORDER — IBUPROFEN 800 MG PO TABS: 800.0000 mg | ORAL_TABLET | Freq: Three times a day (TID) | ORAL | 0 refills | Status: DC | PRN

## 2018-09-26 NOTE — Discharge Instructions (Signed)
Have sutures out in 14 days.  Keep the area clean and dry.  If you have any complications or changes you can follow-up with hand surgeon provided.

## 2018-09-26 NOTE — ED Notes (Addendum)
Verbalized understanding discharge and follow-up instructions. In no acute distress.  Pt walked to exit.

## 2019-09-13 ENCOUNTER — Other Ambulatory Visit: Payer: Self-pay

## 2019-09-13 ENCOUNTER — Ambulatory Visit (INDEPENDENT_AMBULATORY_CARE_PROVIDER_SITE_OTHER): Payer: Commercial Managed Care - PPO | Admitting: Family Medicine

## 2019-09-13 ENCOUNTER — Encounter: Payer: Self-pay | Admitting: Family Medicine

## 2019-09-13 VITALS — BP 130/84 | HR 74 | Temp 98.0°F | Resp 16 | Ht 64.5 in | Wt 249.8 lb

## 2019-09-13 DIAGNOSIS — Z1211 Encounter for screening for malignant neoplasm of colon: Secondary | ICD-10-CM | POA: Diagnosis not present

## 2019-09-13 DIAGNOSIS — Z0000 Encounter for general adult medical examination without abnormal findings: Secondary | ICD-10-CM

## 2019-09-13 DIAGNOSIS — Z23 Encounter for immunization: Secondary | ICD-10-CM | POA: Diagnosis not present

## 2019-09-13 DIAGNOSIS — K5909 Other constipation: Secondary | ICD-10-CM

## 2019-09-13 DIAGNOSIS — N951 Menopausal and female climacteric states: Secondary | ICD-10-CM | POA: Diagnosis not present

## 2019-09-13 LAB — CBC WITH DIFFERENTIAL/PLATELET
Basophils Absolute: 0.1 10*3/uL (ref 0.0–0.1)
Basophils Relative: 1.1 % (ref 0.0–3.0)
Eosinophils Absolute: 0.3 10*3/uL (ref 0.0–0.7)
Eosinophils Relative: 2.6 % (ref 0.0–5.0)
HCT: 41 % (ref 36.0–46.0)
Hemoglobin: 13.7 g/dL (ref 12.0–15.0)
Lymphocytes Relative: 18 % (ref 12.0–46.0)
Lymphs Abs: 1.9 10*3/uL (ref 0.7–4.0)
MCHC: 33.4 g/dL (ref 30.0–36.0)
MCV: 87.1 fl (ref 78.0–100.0)
Monocytes Absolute: 0.7 10*3/uL (ref 0.1–1.0)
Monocytes Relative: 6.7 % (ref 3.0–12.0)
Neutro Abs: 7.5 10*3/uL (ref 1.4–7.7)
Neutrophils Relative %: 71.6 % (ref 43.0–77.0)
Platelets: 228 10*3/uL (ref 150.0–400.0)
RBC: 4.71 Mil/uL (ref 3.87–5.11)
RDW: 14.3 % (ref 11.5–15.5)
WBC: 10.5 10*3/uL (ref 4.0–10.5)

## 2019-09-13 LAB — COMPREHENSIVE METABOLIC PANEL
ALT: 24 U/L (ref 0–35)
AST: 19 U/L (ref 0–37)
Albumin: 3.9 g/dL (ref 3.5–5.2)
Alkaline Phosphatase: 71 U/L (ref 39–117)
BUN: 16 mg/dL (ref 6–23)
CO2: 26 mEq/L (ref 19–32)
Calcium: 8.8 mg/dL (ref 8.4–10.5)
Chloride: 105 mEq/L (ref 96–112)
Creatinine, Ser: 0.76 mg/dL (ref 0.40–1.20)
GFR: 80.44 mL/min (ref >60.00)
Glucose, Bld: 95 mg/dL (ref 70–99)
Potassium: 4.6 mEq/L (ref 3.5–5.1)
Sodium: 137 mEq/L (ref 135–145)
Total Bilirubin: 0.5 mg/dL (ref 0.2–1.2)
Total Protein: 6.6 g/dL (ref 6.0–8.3)

## 2019-09-13 LAB — LIPID PANEL
Cholesterol: 167 mg/dL (ref 0–200)
HDL: 47.9 mg/dL (ref >39.00)
LDL Cholesterol: 105 mg/dL — ABNORMAL HIGH (ref 0–99)
NonHDL: 119.55
Total CHOL/HDL Ratio: 3
Triglycerides: 72 mg/dL (ref 0.0–149.0)
VLDL: 14.4 mg/dL (ref 0.0–40.0)

## 2019-09-13 LAB — TSH: TSH: 1.74 u[IU]/mL (ref 0.35–4.50)

## 2019-09-13 NOTE — Patient Instructions (Signed)
It was so good seeing you again! Thank you for establishing with my new practice and allowing me to continue caring for you. It means a lot to me.   Please schedule a follow up appointment with me in 12 months for your annual complete physical; please come fasting.  I recommend the Cologuard test for your colon cancer screening that is due. I have ordered this test for you. The [facility] will soon contact you to verify your insurance, address etc. They will then send you the kit; follow the instructions in the kit and return the kit to Colog[facility]. They will run the test and send the results to me. I will then give you the results. If this test is negative, we recommend repeating a colon cancer screening test in 3 years. If it is positive, I will refer you to a Gastroenterologist so you can get set up for the recommended colonoscopy.  Thank you!  I will release your lab results to you on your MyChart account with further instructions. Please reply with any questions.   Today you were given your flu vaccination.   Please do these things to maintain good health!   Exercise at least 30-45 minutes a day,  4-5 days a week.   Eat a low-fat diet with lots of fruits and vegetables, up to 7-9 servings per day.  Drink plenty of water daily. Try to drink 8 8oz glasses per day.  Seatbelts can save your life. Always wear your seatbelt.  Place Smoke Detectors on every level of your home and check batteries every year.  Schedule an appointment with an eye doctor for an eye exam every 1-2 years  Safe sex - use condoms to protect yourself from STDs if you could be exposed to these types of infections. Use birth control if you do not want to become pregnant and are sexually active.  Avoid heavy alcohol use. If you drink, keep it to less than 2 drinks/day and not every day.  Francisco.  Choose someone you trust that could speak for you if you became unable to speak for  yourself.  Depression is common in our stressful world.If you're feeling down or losing interest in things you normally enjoy, please come in for a visit.  If anyone is threatening or hurting you, please get help. Physical or Emotional Violence is never OK.

## 2019-09-13 NOTE — Progress Notes (Signed)
Subjective  Chief Complaint  Patient presents with  . Establish Care  . Annual Exam    Fasting    HPI: Ann Watson is a 48 y.o. female who presents to [facility] today for a Female Wellness Visit. She also has the concerns and/or needs as listed above in the chief complaint. These will be addressed in addition to the Health Maintenance Visit. Former patient of mine at [facility]; last seen by me in 2018. I reviewed records. Last cpe 2019. Sees GYN for female wellness.   Wellness Visit: annual visit with health maintenance review and exam without Pap   HM: pap and mammo up to date and reported as normal per gyn. Due for 1st CRC screen; avg risk pt. Prefers to defer colonoscopy. Obesity: starting low carb diet today. Has guidance. Physical activity is high through hobbies: wood lathing. Sedentary work but busy. Happy w/o mood problems. Due flu shot. Having regular menses; h/o menorrhagia s/p ablation and managed by GYN.  Chronic disease f/u and/or acute problem visit: (deemed necessary to be done in addition to the wellness visit):  Sees chiropractor for routine back care.   Feels "catch" at times beneath right rib cage if bending forward. No associated sxs.   Assessment  1. Annual physical exam   2. Encounter for screening colonoscopy   3. Need for immunization against influenza   4. Perimenopausal   5. Morbid obesity ([place])   6. Constipation, chronic      Plan  Female Wellness Visit:  Age appropriate Health Maintenance and Prevention measures were discussed with patient. Included topics are cancer screening recommendations, ways to keep healthy (see AVS) including dietary and exercise recommendations, regular eye and dental care, use of seat belts, and avoidance of moderate alcohol use and tobacco use. Cologuard ordered. Flu shot today.   BMI: discussed patient's BMI and encouraged positive lifestyle modifications to help get to or maintain a target  BMI.  HM needs and immunizations were addressed and ordered. See below for orders. See HM and immunization section for updates.  Routine labs and screening tests ordered including cmp, cbc and lipids where appropriate.  Discussed recommendations regarding Vit D and calcium supplementation (see AVS)  Chronic disease management visit and/or acute problem visit:  Agree with diet and exercise plan; education given.   msk pain RUQ: reassured. Should improve with weight loss.   Chronic constipation is controlled.   Follow up: Return for complete physical. in one year Orders Placed This Encounter  Procedures  . Flu Vaccine QUAD 36+ mos IM  . Cologuard  . CBC with Differential/Platelet  . Comprehensive metabolic panel  . Lipid panel  . TSH   No orders of the defined types were placed in this encounter.     Lifestyle: Body mass index is 42.22 kg/m. Wt Readings from Last 3 Encounters:  09/13/19 249 lb 12.8 oz (113.3 kg)  08/19/18 235 lb 3.7 oz (106.7 kg)  08/10/18 235 lb 4 oz (106.7 kg)     Patient Active Problem List   Diagnosis Date Noted  . Perimenopausal 09/13/2019  . Morbid obesity ([place]) 09/13/2019  . Sensorineural hearing loss (SNHL), bilateral 09/10/2018  . History of kidney stones 03/09/2018  . Cervical radiculopathy 12/21/2015    Para [facility], ortho   . Constipation, chronic 11/15/2013  . Internal hemorrhoids with prolapse/pain 08/07/2011   Health Maintenance  Topic Date Due  . Fecal DNA (Cologuard)  05/11/2019  . INFLUENZA VACCINE  07/24/2019  .  MAMMOGRAM  04/22/2020  . PAP SMEAR-Modifier  06/22/2021  . TETANUS/TDAP  06/25/2028  . HIV Screening  Completed   \ Immunization History  Administered Date(s) Administered  . Influenza, Seasonal, Injecte, Preservative Fre 10/21/2014, 10/13/2015  . Influenza,inj,Quad PF,6+ Mos 09/13/2019  . Td 12/23/2010  . Tdap 01/23/2016, 06/25/2018   We updated and reviewed the patient's past history in detail and it is  documented below. Allergies: Patient is allergic to bee venom; kiwi extract; and mango flavor. Past Medical History Patient  has a past medical history of External hemorrhoid, thrombosed (08/07/2011), Hemorrhoids, History of kidney stones, and HSV infection. Past Surgical History Patient  has a past surgical history that includes Kidney stone surgery; ORIF ankle fracture (Right, 01/15/2014); Multiple tooth extractions; Dilation and curettage of uterus; and Dilatation & currettage/hysteroscopy with hydrothermal ablation (N/A, 08/19/2018). Family History: Patient family history includes Arthritis in her maternal grandmother and paternal grandfather; Birth defects in her paternal grandfather; Cancer in her paternal grandfather; Depression in her maternal grandmother; Diabetes in her maternal grandfather and mother; Drug abuse in her father; Early death in her father and paternal grandfather; Heart disease in her maternal grandfather, mother, and paternal grandfather; Hyperlipidemia in her maternal grandmother; Hypertension in her maternal grandmother and mother; Thyroid cancer (age of onset: 20) in her maternal aunt. Social History:  Patient  reports that she quit smoking about 12 years ago. Her smoking use included cigarettes. She has a 34.50 pack-year smoking history. She has never used smokeless tobacco. She reports current alcohol use of about 4.0 - 5.0 standard drinks of alcohol per week. She reports that she does not use drugs.  Review of Systems: Constitutional: negative for fever or malaise Ophthalmic: negative for photophobia, double vision or loss of vision Cardiovascular: negative for chest pain, dyspnea on exertion, or new LE swelling Respiratory: negative for SOB or persistent cough Gastrointestinal: negative for abdominal pain, change in bowel habits or melena Genitourinary: negative for dysuria or gross hematuria, no abnormal uterine bleeding or disharge Musculoskeletal: negative for new  gait disturbance or muscular weakness Integumentary: negative for new or persistent rashes, no breast lumps Neurological: negative for TIA or stroke symptoms Psychiatric: negative for SI or delusions Allergic/Immunologic: negative for hives  Patient Care Team    Relationship Specialty Notifications Start End  [name], MD PCP - General Family Medicine  09/13/19   Meredith Pel, MD Consulting Physician Orthopedic Surgery  09/13/19   Ob/Gyn, [place] Physician Gynecology  09/13/19     Objective  Vitals: BP 130/84   Pulse 74   Temp 98 F (36.7 C) (Tympanic)   Resp 16   Ht 5' 4.5" (1.638 m)   Wt 249 lb 12.8 oz (113.3 kg)   LMP 08/23/2019   SpO2 95%   BMI 42.22 kg/m  General:  Well developed, well nourished, no acute distress  Psych:  Alert and orientedx3,normal mood and affect HEENT:  Normocephalic, atraumatic, non-icteric sclera, PERRL, oropharynx is clear without mass or exudate, supple neck without adenopathy, mass or thyromegaly Cardiovascular:  Normal S1, S2, RRR without gallop, rub or murmur, nondisplaced PMI Respiratory:  Good breath sounds bilaterally, CTAB with normal respiratory effort Gastrointestinal: normal bowel sounds, soft, non-tender, no noted masses. No HSM MSK: no deformities, contusions. Joints are without erythema or swelling. Spine and CVA region are nontender Skin:  Warm, no rashes or suspicious lesions noted Neurologic:    Mental status is normal. CN 2-11 are normal. Gross motor and sensory exams are normal.  Normal gait. No tremor    Commons side effects, risks, benefits, and alternatives for medications and treatment plan prescribed today were discussed, and the patient expressed understanding of the given instructions. Patient is instructed to call or message via MyChart if he/she has any questions or concerns regarding our treatment plan. No barriers to understanding were identified. We discussed Red Flag symptoms and signs in  detail. Patient expressed understanding regarding what to do in case of urgent or emergency type symptoms.   Medication list was reconciled, printed and provided to the patient in AVS. Patient instructions and summary information was reviewed with the patient as documented in the AVS. This note was prepared with assistance of Dragon voice recognition software. Occasional wrong-word or sound-a-like substitutions may have occurred due to the inherent limitations of voice recognition software

## 2020-03-03 ENCOUNTER — Ambulatory Visit: Payer: Commercial Managed Care - PPO | Attending: Internal Medicine

## 2020-03-03 DIAGNOSIS — Z23 Encounter for immunization: Secondary | ICD-10-CM

## 2020-03-03 NOTE — Progress Notes (Signed)
   Covid-19 Vaccination Clinic  Name:  Ann Watson    MRN: [id number] DOB: 14-Dec-1971  03/03/2020  Ann Watson was observed post Covid-19 immunization for 30 minutes based on pre-vaccination screening without incident. She was provided with Vaccine Information Sheet and instruction to access the V-Safe system.   Ann Watson was instructed to call 911 with any severe reactions post vaccine: Marland Kitchen Difficulty breathing  . Swelling of face and throat  . A fast heartbeat  . A bad rash all over body  . Dizziness and weakness   Immunizations Administered    Name Date Dose VIS Date Route   Pfizer COVID-19 Vaccine 03/03/2020  3:02 PM 0.3 mL 12/03/2019 Intramuscular   Manufacturer: [place]   Lot: [id number]   [place]: [id number]

## 2020-03-27 ENCOUNTER — Ambulatory Visit: Payer: Commercial Managed Care - PPO | Attending: Internal Medicine

## 2020-03-27 DIAGNOSIS — Z23 Encounter for immunization: Secondary | ICD-10-CM

## 2020-03-27 NOTE — Progress Notes (Signed)
   Covid-19 Vaccination Clinic  Name:  Ann Watson    MRN: [id number] DOB: 1971-05-01  03/27/2020  [name] was observed post Covid-19 immunization for 30 minutes based on pre-vaccination screening without incident. She was provided with Vaccine Information Sheet and instruction to access the V-Safe system.   [name] was instructed to call 911 with any severe reactions post vaccine: Marland Kitchen Difficulty breathing  . Swelling of face and throat  . A fast heartbeat  . A bad rash all over body  . Dizziness and weakness   Immunizations Administered    Name Date Dose VIS Date Route   Pfizer COVID-19 Vaccine 03/27/2020  4:26 PM 0.3 mL 12/03/2019 Intramuscular   Manufacturer: [place]   Lot: [id number]   [place]: [id number]

## 2020-04-18 ENCOUNTER — Other Ambulatory Visit: Payer: Self-pay | Admitting: Obstetrics & Gynecology

## 2020-05-18 LAB — HM MAMMOGRAPHY

## 2020-05-24 ENCOUNTER — Encounter: Payer: Self-pay | Admitting: Family Medicine

## 2020-07-05 ENCOUNTER — Ambulatory Visit (INDEPENDENT_AMBULATORY_CARE_PROVIDER_SITE_OTHER): Payer: Commercial Managed Care - PPO | Admitting: Physician Assistant

## 2020-07-05 ENCOUNTER — Encounter: Payer: Self-pay | Admitting: Physician Assistant

## 2020-07-05 ENCOUNTER — Other Ambulatory Visit: Payer: Self-pay

## 2020-07-05 VITALS — BP 136/78 | HR 76 | Temp 98.0°F | Ht 64.5 in | Wt 250.2 lb

## 2020-07-05 DIAGNOSIS — R1011 Right upper quadrant pain: Secondary | ICD-10-CM

## 2020-07-05 MED ORDER — SUCRALFATE 1 G PO TABS: 1.0000 g | ORAL_TABLET | Freq: Three times a day (TID) | ORAL | 0 refills | Status: DC

## 2020-07-05 MED ORDER — PANTOPRAZOLE SODIUM 40 MG PO TBEC: 40.0000 mg | DELAYED_RELEASE_TABLET | Freq: Every day | ORAL | 0 refills | Status: DC

## 2020-07-05 NOTE — Progress Notes (Signed)
Ann Watson is a 49 y.o. female here for a new problem.  I acted as a Education administrator for [employer], PA-C [employer], [place]  History of Present Illness:   Chief Complaint  Patient presents with  . Abdominal Pain    HPI  Abdominal Pain Pt c/o abdomen pain after eating. Symptoms started Saturday with epigastric pain and distension. Noticed stool was a blackish color on Sunday. BM is back to normal now. Pain is in the upper portion of her abdomen. Took Pepto-Bismol and Tums which only helped temporarily. Did Teledoc visit yesterday and was told that she likely had gallbladder issues.  Takes ibuprofen infrequently, no excessive use of this or ASA. Does NOT feel like the stools turned black due to pepto bismol.  Regardless of type of foods she eats she is having abdominal pain. Had pain with broth this AM. Drinking plenty of fluids. Doesn't drink much ETOH (3-4 glasses per week).  Denies: vomiting, unintentional weight loss, hematuria, pain with urination, diarrhea    Past Medical History:  Diagnosis Date  . External hemorrhoid, thrombosed 08/07/2011  . Hemorrhoids   . History of kidney stones    surgery to remove  . HSV infection      Social History   Tobacco Use  . Smoking status: Former Smoker    Packs/day: 1.50    Years: 23.00    Pack years: 34.50    Types: Cigarettes    Quit date: 08/10/2007    Years since quitting: 12.9  . Smokeless tobacco: Never Used  Vaping Use  . Vaping Use: Never used  Substance Use Topics  . Alcohol use: Yes    Alcohol/week: 4.0 - 5.0 standard drinks    Types: 4 - 5 Glasses of wine per week    Comment: wine - weekly  . Drug use: No    Past Surgical History:  Procedure Laterality Date  . DILATION AND CURETTAGE OF UTERUS     x 1   . DILITATION & CURRETTAGE/HYSTROSCOPY WITH HYDROTHERMAL ABLATION N/A 08/19/2018   Procedure: DILATATION & CURETTAGE/HYSTEROSCOPY WITH HYDROTHERMAL ABLATION;  Surgeon: Janyth Pupa, DO;  Location: [facility];   Service: Gynecology;  Laterality: N/A;  . KIDNEY STONE SURGERY    . MULTIPLE TOOTH EXTRACTIONS     poor dental  . ORIF ANKLE FRACTURE Right 01/15/2014   Procedure: OPEN REDUCTION INTERNAL FIXATION (ORIF) ANKLE FRACTURE;  Surgeon: Meredith Pel, MD;  Location: [facility];  Service: Orthopedics;  Laterality: Right;  OPEN REDUCTION INTERNAL FIXATION BI-MALLEOLAR ANKLE FRACTURE (SMITH-NEPHEW    Family History  Problem Relation Age of Onset  . Diabetes Mother   . Heart disease Mother   . Hypertension Mother   . Early death Father   . Drug abuse Father   . Thyroid cancer Maternal Aunt 60  . Hyperlipidemia Maternal Grandmother   . Hypertension Maternal Grandmother   . Arthritis Maternal Grandmother   . Depression Maternal Grandmother   . Diabetes Maternal Grandfather   . Heart disease Maternal Grandfather   . Arthritis Paternal Grandfather   . Birth defects Paternal Grandfather   . Cancer Paternal Grandfather   . Early death Paternal Grandfather   . Heart disease Paternal Grandfather     Allergies  Allergen Reactions  . Bee Venom Anaphylaxis  . Kiwi Extract Anaphylaxis  . Mango Flavor Hives    Oil in mango    Current Medications:   Current Outpatient Medications:  .  pantoprazole (PROTONIX) 40 MG tablet, Take 1 tablet (40  mg total) by mouth daily., Disp: 30 tablet, Rfl: 0 .  sucralfate (CARAFATE) 1 g tablet, Take 1 tablet (1 g total) by mouth 4 (four) times daily -  with meals and at bedtime for 14 days., Disp: 56 tablet, Rfl: 0   Review of Systems:   ROS  Negative unless otherwise specified per HPI.  Vitals:   Vitals:   07/05/20 1525  BP: 136/78  Pulse: 76  Temp: 98 F (36.7 C)  TempSrc: Temporal  SpO2: 95%  Weight: 250 lb 3.2 oz (113.5 kg)  Height: 5' 4.5" (1.638 m)     Body mass index is 42.28 kg/m.  Physical Exam:   Physical Exam Vitals and nursing note reviewed.  Constitutional:      General: She is not in acute distress.    Appearance: She is  well-developed. She is not ill-appearing or toxic-appearing.  Cardiovascular:     Rate and Rhythm: Normal rate and regular rhythm.     Pulses: Normal pulses.     Heart sounds: Normal heart sounds, S1 normal and S2 normal.     Comments: No LE edema Pulmonary:     Effort: Pulmonary effort is normal.     Breath sounds: Normal breath sounds.  Abdominal:     General: Abdomen is flat. Bowel sounds are normal.     Palpations: Abdomen is soft.     Tenderness: There is no abdominal tenderness. There is no left CVA tenderness.  Skin:    General: Skin is warm and dry.  Neurological:     Mental Status: She is alert.     GCS: GCS eye subscore is 4. GCS verbal subscore is 5. GCS motor subscore is 6.  Psychiatric:        Speech: Speech normal.        Behavior: Behavior normal. Behavior is cooperative.       Assessment and Plan:   Ann Watson was seen today for abdominal pain.  Diagnoses and all orders for this visit:  RUQ pain No evidence of acute abdomen today on exam.  Will update CBC, CMP and add lipase and H. pylori test today.  Possible diagnoses include: Gallbladder disease, gastritis, constipation, ulcer, among others.  Will empirically treat with oral Protonix and 4 times daily Carafate to see if this helps her symptoms.  I recommended a bland diet.  Worsening ER precautions advised.  We are also going to obtain an ultrasound of the abdomen for further evaluation. -     US Abdomen Limited RUQ; Future -     CBC with Differential/Platelet -     Comprehensive metabolic panel -     Lipase -     H. pylori antibody, IgG  Other orders -     pantoprazole (PROTONIX) 40 MG tablet; Take 1 tablet (40 mg total) by mouth daily. -     sucralfate (CARAFATE) 1 g tablet; Take 1 tablet (1 g total) by mouth 4 (four) times daily -  with meals and at bedtime for 14 days.   . Reviewed expectations re: course of current medical issues. . Discussed self-management of symptoms. . Outlined signs and  symptoms indicating need for more acute intervention. . Patient verbalized understanding and all questions were answered. . See orders for this visit as documented in the electronic medical record. . Patient received an After-Visit Summary.  CMA or LPN served as scribe during this visit. History, Physical, and Plan performed by medical provider. The above documentation has been reviewed and is  accurate and complete.   Inda Coke, PA-C

## 2020-07-05 NOTE — Patient Instructions (Signed)
It was great to see you!  Please get abdominal ultrasound as scheduled.  We will be in touch with labs and further recommendations.  Start daily protonix and 4-times a day carafate. Bland diet.  Many things can cause belly (abdominal) pain. Most times, belly pain is not dangerous. Many cases of belly pain can be watched and treated at home. Sometimes belly pain is serious, though. Your doctor will try to find the cause of your belly pain.   Follow these instructions at home:  Take over-the-counter and prescription medicines only as told by your doctor. Do not take medicines that help you poop (laxatives) unless told to by your doctor.  Drink enough fluid to keep your pee (urine) clear or pale yellow.  Watch your belly pain for any changes.  Keep all follow-up visits as told by your doctor. This is important.  Contact a doctor if:  Your belly pain changes or gets worse.  You are not hungry, or you lose weight without trying.  You are having trouble pooping (constipated) or have watery poop (diarrhea) for more than 2-3 days.  You have pain when you pee or poop.  Your belly pain wakes you up at night.  Your pain gets worse with meals, after eating, or with certain foods.  You are throwing up and cannot keep anything down.  You have a fever.  Get help right away if:  Your pain does not go away as soon as your doctor says it should.  You cannot stop throwing up.  Your pain is only in areas of your belly, such as the right side or the left lower part of the belly.  You have bloody or black poop, or poop that looks like tar.  You have very bad pain, cramping, or bloating in your belly.  You have signs of not having enough fluid or water in your body (dehydration), such as: ? Dark pee, very little pee, or no pee. ? Cracked lips. ? Dry mouth. ? Sunken eyes. ? Sleepiness. ? Weakness.  Take care,  Inda Coke PA-C

## 2020-07-06 ENCOUNTER — Ambulatory Visit (HOSPITAL_COMMUNITY)
Admission: RE | Admit: 2020-07-06 | Discharge: 2020-07-06 | Disposition: A | Discharge status: Home / Self Care | Payer: Commercial Managed Care - PPO | Source: Ambulatory Visit | Attending: Physician Assistant | Admitting: Physician Assistant

## 2020-07-06 DIAGNOSIS — R1011 Right upper quadrant pain: Secondary | ICD-10-CM | POA: Diagnosis present

## 2020-07-06 LAB — COMPREHENSIVE METABOLIC PANEL
ALT: 16 U/L (ref 0–35)
AST: 16 U/L (ref 0–37)
Albumin: 4.1 g/dL (ref 3.5–5.2)
Alkaline Phosphatase: 75 U/L (ref 39–117)
BUN: 16 mg/dL (ref 6–23)
CO2: 27 mEq/L (ref 19–32)
Calcium: 9.2 mg/dL (ref 8.4–10.5)
Chloride: 103 mEq/L (ref 96–112)
Creatinine, Ser: 0.82 mg/dL (ref 0.40–1.20)
GFR: 73.45 mL/min (ref >60.00)
Glucose, Bld: 85 mg/dL (ref 70–99)
Potassium: 4 mEq/L (ref 3.5–5.1)
Sodium: 138 mEq/L (ref 135–145)
Total Bilirubin: 0.4 mg/dL (ref 0.2–1.2)
Total Protein: 6.7 g/dL (ref 6.0–8.3)

## 2020-07-06 LAB — CBC WITH DIFFERENTIAL/PLATELET
Basophils Absolute: 0.1 10*3/uL (ref 0.0–0.1)
Basophils Relative: 0.8 % (ref 0.0–3.0)
Eosinophils Absolute: 0.4 10*3/uL (ref 0.0–0.7)
Eosinophils Relative: 4.1 % (ref 0.0–5.0)
HCT: 38.9 % (ref 36.0–46.0)
Hemoglobin: 13 g/dL (ref 12.0–15.0)
Lymphocytes Relative: 20.6 % (ref 12.0–46.0)
Lymphs Abs: 2.2 10*3/uL (ref 0.7–4.0)
MCHC: 33.4 g/dL (ref 30.0–36.0)
MCV: 85.8 fl (ref 78.0–100.0)
Monocytes Absolute: 0.8 10*3/uL (ref 0.1–1.0)
Monocytes Relative: 7.3 % (ref 3.0–12.0)
Neutro Abs: 7.3 10*3/uL (ref 1.4–7.7)
Neutrophils Relative %: 67.2 % (ref 43.0–77.0)
Platelets: 248 10*3/uL (ref 150.0–400.0)
RBC: 4.53 Mil/uL (ref 3.87–5.11)
RDW: 13.9 % (ref 11.5–15.5)
WBC: 10.8 10*3/uL — ABNORMAL HIGH (ref 4.0–10.5)

## 2020-07-06 LAB — H. PYLORI ANTIBODY, IGG: H Pylori IgG: NEGATIVE

## 2020-07-06 LAB — LIPASE: Lipase: 27 U/L (ref 11.0–59.0)

## 2020-07-27 ENCOUNTER — Other Ambulatory Visit: Payer: Self-pay | Admitting: Physician Assistant

## 2020-08-11 ENCOUNTER — Other Ambulatory Visit: Payer: Self-pay | Admitting: Family Medicine

## 2020-12-29 ENCOUNTER — Ambulatory Visit: Payer: Commercial Managed Care - PPO | Attending: Internal Medicine

## 2020-12-29 DIAGNOSIS — Z23 Encounter for immunization: Secondary | ICD-10-CM

## 2020-12-29 NOTE — Progress Notes (Signed)
   Covid-19 Vaccination Clinic  Name:  Ann Watson    MRN: [id number] DOB: 1971/01/28  12/29/2020  [name] was observed post Covid-19 immunization for 30 minutes based on pre-vaccination screening without incident. She was provided with Vaccine Information Sheet and instruction to access the V-Safe system.   [name] was instructed to call 911 with any severe reactions post vaccine: Marland Kitchen Difficulty breathing  . Swelling of face and throat  . A fast heartbeat  . A bad rash all over body  . Dizziness and weakness   Immunizations Administered    Name Date Dose VIS Date Route   Moderna Covid-19 Booster Vaccine 12/29/2020  5:42 PM 0.25 mL 10/11/2020 Intramuscular   Manufacturer: Levan Hurst   Lot: [id number]   [place]: [id number]

## 2021-03-21 ENCOUNTER — Other Ambulatory Visit: Payer: Self-pay | Admitting: Obstetrics and Gynecology

## 2021-05-24 LAB — HM MAMMOGRAPHY

## 2021-05-28 ENCOUNTER — Encounter: Payer: Self-pay | Admitting: Family Medicine

## 2021-06-04 ENCOUNTER — Encounter (HOSPITAL_BASED_OUTPATIENT_CLINIC_OR_DEPARTMENT_OTHER): Payer: Self-pay | Admitting: Obstetrics and Gynecology

## 2021-06-04 ENCOUNTER — Other Ambulatory Visit: Payer: Self-pay

## 2021-06-04 DIAGNOSIS — D219 Benign neoplasm of connective and other soft tissue, unspecified: Secondary | ICD-10-CM

## 2021-06-04 HISTORY — DX: Benign neoplasm of connective and other soft tissue, unspecified: D21.9

## 2021-06-04 NOTE — Congregational Nurse Program (Signed)
YOU ARE SCHEDULED FOR A COVID TEST  06-08-2021 @ 145 PM.THIS TEST MUST BE DONE BEFORE SURGERY. GO TO  [place]. [place], [place], IT IS APPROXIMATELY 2 MINUTES PAST [employer] ON THE RIGHT AND REMAIN IN YOUR CAR, THIS IS A DRIVE UP TEST.       Your procedure is scheduled on 06-12-2021  Report to [place]. M.   Call this number if you have problems the morning of surgery  :[phone].   OUR ADDRESS IS [address].  WE ARE LOCATED IN [facility].  PLEASE BRING YOUR INSURANCE CARD AND PHOTO ID DAY OF SURGERY.  ONLY ONE PERSON ALLOWED IN FACILITY WAITING AREA.                                     REMEMBER:  DO NOT EAT FOOD, CANDY GUM OR MINTS  AFTER MIDNIGHT . YOU MAY HAVE CLEAR LIQUIDS FROM MIDNIGHT UNTIL 800 AM. NO CLEAR LIQUIDS AFTER 800 AM  DAY OF SURGERY.   YOU MAY  BRUSH YOUR TEETH MORNING OF SURGERY AND RINSE YOUR MOUTH OUT, NO CHEWING GUM CANDY OR MINTS.    CLEAR LIQUID DIET   Foods Allowed                                                                     Foods Excluded  Coffee and tea, regular and decaf                             liquids that you cannot  Plain Jell-O any favor except red or purple                                           see through such as: Fruit ices (not with fruit pulp)                                     milk, soups, orange juice  Iced Popsicles                                    All solid food Carbonated beverages, regular and diet                                    Cranberry, grape and apple juices Sports drinks like Gatorade Lightly seasoned clear broth or consume(fat free) Sugar, honey syrup  Sample Menu Breakfast                                Lunch  Supper Cranberry juice                    Beef broth                            Chicken broth Jell-O                                     Grape juice                           Apple  juice Coffee or tea                        Jell-O                                      Popsicle                                                Coffee or tea                        Coffee or tea  _____________________________________________________________________     TAKE THESE MEDICATIONS MORNING OF SURGERY WITH A SIP OF WATER:  _______  ONE VISITOR IS ALLOWED IN WAITING ROOM ONLY DAY OF SURGERY.  NO VISITOR MAY SPEND THE NIGHT.  VISITOR ARE ALLOWED TO STAY UNTIL 800 PM.                                    DO NOT WEAR JEWERLY, MAKE UP. DO NOT WEAR LOTIONS, POWDERS, PERFUMES OR NAIL POLISH. DO NOT SHAVE FOR 24 HOURS PRIOR TO DAY OF SURGERY. MEN MAY SHAVE FACE AND NECK. CONTACTS, GLASSES, OR DENTURES MAY NOT BE WORN TO SURGERY.                                    [facility] IS NOT RESPONSIBLE  FOR ANY BELONGINGS.                                                                    Marland Kitchen            - Preparing for Surgery Before surgery, you can play an important role.  Because skin is not sterile, your skin needs to be as free of germs as possible.  You can reduce the number of germs on your skin by washing with CHG (chlorahexidine gluconate) soap before surgery.  CHG is an antiseptic cleaner which kills germs and bonds with the skin to continue killing germs even after washing. Please DO NOT use if you have an allergy to CHG or antibacterial soaps.  If your skin becomes reddened/irritated  stop using the CHG and inform your nurse when you arrive at Short Stay. Do not shave (including legs and underarms) for at least 48 hours prior to the first CHG shower.  You may shave your face/neck. Please follow these instructions carefully:  1.  Shower with CHG Soap the night before surgery and the  morning of Surgery.  2.  If you choose to wash your hair, wash your hair first as usual with your  normal  shampoo.  3.  After you shampoo, rinse your hair and body thoroughly to remove the  shampoo.                             4.  Use CHG as you would any other liquid soap.  You can apply chg directly  to the skin and wash                      Gently with a scrungie or clean washcloth.  5.  Apply the CHG Soap to your body ONLY FROM THE NECK DOWN.   Do not use on face/ open                           Wound or open sores. Avoid contact with eyes, ears mouth and genitals (private parts).                       Wash face,  Genitals (private parts) with your normal soap.             6.  Wash thoroughly, paying special attention to the area where your surgery  will be performed.  7.  Thoroughly rinse your body with warm water from the neck down.  8.  DO NOT shower/wash with your normal soap after using and rinsing off  the CHG Soap.                9.  Pat yourself dry with a clean towel.            10.  Wear clean pajamas.            11.  Place clean sheets on your bed the night of your first shower and do not  sleep with pets. Day of Surgery : Do not apply any lotions/deodorants the morning of surgery.  Please wear clean clothes to the hospital/surgery center.  FAILURE TO FOLLOW THESE INSTRUCTIONS MAY RESULT IN THE CANCELLATION OF YOUR SURGERY PATIENT SIGNATURE_________________________________  NURSE SIGNATURE__________________________________  ________________________________________________________________________                                                        QUESTIONS Ann Watson PRE OP NURSE PHONE [phone].

## 2021-06-04 NOTE — Progress Notes (Signed)
Spoke w/ via phone for pre-op interview---pt Lab needs dos---- urine poct  per anesthesia, surgery orders pending            Lab results------has pre op lab appt 06-08-2021 1300 for cbc t & s COVID test -----06-08-2021 1345 pm (overnight stay) NPO after MN NO Solid Food.  Clear liquids from MN until---800 am then npo Med rec completed Medications to take morning of surgery -----none Diabetic medication ----- Patient instructed no nail polish to be worn day of surgery Patient instructed to bring photo id and insurance card day of surgery Patient aware to have Driver (ride ) / caregiver  daughter desyre calma will stay   for 24 hours after surgery  Patient Special Instructions -----overnight stay instructions given Pre-Op special Istructions -----req surgery orders dr Landry Mellow epic ib Patient verbalized understanding of instructions that were given at this phone interview. Patient denies shortness of breath, chest pain, fever, cough at this phone interview.

## 2021-06-08 ENCOUNTER — Other Ambulatory Visit: Payer: Self-pay

## 2021-06-08 ENCOUNTER — Encounter (HOSPITAL_COMMUNITY)
Admission: RE | Admit: 2021-06-08 | Discharge: 2021-06-08 | Disposition: A | Discharge status: Home / Self Care | Payer: Commercial Managed Care - PPO | Source: Ambulatory Visit | Attending: Obstetrics and Gynecology | Admitting: Obstetrics and Gynecology

## 2021-06-08 ENCOUNTER — Other Ambulatory Visit (HOSPITAL_COMMUNITY)
Admission: RE | Admit: 2021-06-08 | Discharge: 2021-06-08 | Disposition: A | Discharge status: Home / Self Care | Payer: Commercial Managed Care - PPO | Source: Ambulatory Visit | Attending: Obstetrics and Gynecology | Admitting: Obstetrics and Gynecology

## 2021-06-08 DIAGNOSIS — Z20822 Contact with and (suspected) exposure to covid-19: Secondary | ICD-10-CM | POA: Insufficient documentation

## 2021-06-08 DIAGNOSIS — Z01812 Encounter for preprocedural laboratory examination: Secondary | ICD-10-CM | POA: Insufficient documentation

## 2021-06-08 LAB — CBC
HCT: 41 % (ref 36.0–46.0)
Hemoglobin: 12.7 g/dL (ref 12.0–15.0)
MCH: 27.3 pg (ref 26.0–34.0)
MCHC: 31 g/dL (ref 30.0–36.0)
MCV: 88.2 fL (ref 80.0–100.0)
Platelets: 228 10*3/uL (ref 150–400)
RBC: 4.65 MIL/uL (ref 3.87–5.11)
RDW: 13.9 % (ref 11.5–15.5)
WBC: 11 10*3/uL — ABNORMAL HIGH (ref 4.0–10.5)
nRBC: 0 % (ref 0.0–0.2)

## 2021-06-08 LAB — SARS CORONAVIRUS 2 (TAT 6-24 HRS): SARS Coronavirus 2: NEGATIVE

## 2021-06-11 ENCOUNTER — Other Ambulatory Visit: Payer: Self-pay | Admitting: Obstetrics and Gynecology

## 2021-06-11 NOTE — H&P (Signed)
Subjective: Chief Complaint(s):   Preop visit- Heavy menses/ Fibroids   HPI:  Isolation Precautions Yes- Pfizer" label="Has patient received COVID-19 vaccination?" propId="[id number]" catId="[id number]" encId="[id number]"Has patient received COVID-19 vaccination? 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 Does patient report new onset of COVID symptoms? No. Has patient or close contact tested positive for COVID-19? No , not in the past 2 weeks.  General 50 yo pt presents for pre-op visit. Pt is scheduled for robotic assisted bilateral hysterectomy w/ bilateral salpingectomy on 06/12/2021. Pt started complaining of heavy menses w/ enormous clots on 02/27/2021. She reported changing her overnight pads and ultra tampons every 1-2 hrs on her heaviest day. U/S on 02/27/2021 revealed uterus measuring 11.3 x 7.3 x 8.0 cm. 5 fibroids measured w/ the largest measuring 5.8 cm. Endometrium and IUD could not be visualized. All fibroids had increased in size since [place] on 07/18/2020. EMB performed on 03/21/2021 was benign.  TODAY: Discussed risks of hysterectomy including but not limited to infection, bleeding, conversion to larger incision, damage to her bowel, bladder, or ureters, with the need for further surgery. Discussed risk of blood transfusion and risk of HIV or hep B&C (1 out of 2 million and 1 out of 200,000, respectively) with blood transfusion. Pt is aware of risks and desires blood transfusion if needed. She is advised to avoid eating or drinking starting midnight prior to surgery. Discussed post-surgery avoidance of driving for 1 week and avoidance of lifting weight greater than 10 lbs or intercourse for 6-8 weeks after procedure. Current Medication: Taking  medroxyPROGESTERone Acetate 10 MG Tablet TAKE 1 TABLET BY MOUTH EVERY DAY.     Medication List reviewed and reconciled with the patient.  Medical History:  kidney stones     hemorrhoids      Allergies/Intolerance: Bee Sting: Allergy -  anaphylaxis Kiwi Mango Passion Fruit OS Gyn History:  Sexual activity currently sexually active. Periods : every month- heavy bleeding. LMP 02/22/2021. Birth control vasectomy. Last pap smear date 04/18/2020 - neg. Last mammogram date 04/2018. Denies Abnormal pap smear. Denies STD. GYN procedures ablation.   OB History:  Number of pregnancies 2, 1 EAB, 1 vaginal delivery. Pregnancy # 1 live birth, vaginal delivery, 10.2lbs .   Surgical History:  kidney stone     R ankle fx 2015     ablation   Hospitalization:  Childbirth   Family History:  Father: deceased 48 yrs, HIV    Mother: alive 59 yrs, hx bipolar disorder, type II diabetes, diagnosed with Diabetes    Paternal [place] Father: alive, some type of cancer, heart disease at age greater than 64    Maternal [place] Father: alive, CAD, diagnosed with Coronary artery disease    Brother 1: alive 19 yrs, A + W    Sister 1: alive 80 yrs, A + W    Daughter(s): alive 3 yrs, A + W    Spouse: alive, A + W   MGF with DM. No breast cancer. MGM with HTN. No hyperlipidemia. No colon cancer, denies any GYN family cancer hx granddaughter "fiona".  Social History: General Tobacco use cigarettes: Former smoker, Quit in year 2009, Tobacco history last updated 05/28/2021, Vaping No.  no EXPOSURE TO PASSIVE SMOKE.  Alcohol: yes, occasionally, no hard liquor.  Caffeine: yes, coffee, 2 servings daily.  Recreational drug use: yes, marijuana sometimes.  no DIET, optivia - irregular.  no Exercise.  Marital Status: married, Ann Watson.  Children: 1 daughter.  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 ROS: CONSTITUTIONAL No" label="Chills" value="" options="no,yes" propid="91" itemid="193425" categoryid="10464" encounterid="[id number]"Chills No. No" label="Fatigue" value="" options="no,yes" propid="91" itemid="172899" categoryid="10464" encounterid="[id number]"Fatigue No. No" label="Fever" value=""  options="no,yes" propid="91" itemid="10467" categoryid="10464" encounterid="[id number]"Fever No. No" label="Night sweats" value="" options="no,yes" propid="91" itemid="193426" categoryid="10464" encounterid="[id number]"Night sweats No. No" label="Recent travel outside [place]" value="" options="no,yes" propid="91" itemid="444261" categoryid="10464" encounterid="[id number]"Recent travel outside [place] No. No" label="Sweats" value="" options="no,yes" propid="91" itemid="193427" categoryid="10464" encounterid="[id number]"Sweats No. No" label="Weight change" value="" options="no,yes" propid="91" itemid="194825" categoryid="10464" encounterid="[id number]"Weight change No.  OPHTHALMOLOGY no" label="Blurring of vision" value="" options="no,yes" propid="91" itemid="12520" categoryid="12516" encounterid="[id number]"Blurring of vision no. no" label="Change in vision" value="" options="no,yes" propid="91" itemid="193469" categoryid="12516" encounterid="[id number]"Change in vision no. no" label="Double vision" value="" options="no,yes" propid="91" itemid="194379" categoryid="12516" encounterid="[id number]"Double vision no.  ENT no" label="Dizziness" value="" options="no,yes" propid="91" itemid="193612" categoryid="10481" encounterid="[id number]"Dizziness no. Nose bleeds no. Sore throat no. Teeth pain no.  ALLERGY no" label="Hives" value="" options="no,yes" propid="91" itemid="202589" categoryid="138152" encounterid="[id number]"Hives no.  CARDIOLOGY no" label="Chest pain" value="" options="no,yes" propid="91" itemid="193603" categoryid="10488" encounterid="[id number]"Chest pain no. no" label="High blood pressure" value="" options="no,yes" propid="91" itemid="199089" categoryid="10488" encounterid="[id number]"High blood pressure no. no" label="Irregular heart beat" value="" options="no,yes" propid="91" itemid="202598" categoryid="10488" encounterid="[id number]"Irregular heart beat no. no" label="Leg edema" value="" options="no,yes" propid="91"  itemid="10491" categoryid="10488" encounterid="[id number]"Leg edema no. no" label="Palpitations" value="" options="no,yes" propid="91" itemid="10490" categoryid="10488" encounterid="[id number]"Palpitations no.  RESPIRATORY no" label="Shortness of breath" value="" options="no" propid="91" itemid="270013" categoryid="138132" encounterid="[id number]"Shortness of breath no. no" label="Cough" value="" options="no,yes" propid="91" itemid="172745" categoryid="138132" encounterid="[id number]"Cough no. no" label="Wheezing" value="" options="no,yes" propid="91" itemid="193621" categoryid="138132" encounterid="[id number]"Wheezing no.  UROLOGY no" label="Pain with urination" value="" options="no,yes" propid="91" itemid="194377" categoryid="138166" encounterid="[id number]"Pain with urination no. no" label="Urinary urgency" value="" options="no,yes" propid="91" itemid="193493" categoryid="138166" encounterid="[id number]"Urinary urgency no. no" label="Urinary frequency" value="" options="no,yes" propid="91" itemid="193492" categoryid="138166" encounterid="[id number]"Urinary frequency no. no" label="Urinary incontinence" value="" options="no,yes" propid="91" itemid="138171" categoryid="138166" encounterid="[id number]"Urinary incontinence no. No" label="Difficulty urinating" value="" options="no,yes" propid="91" itemid="138167" categoryid="138166" encounterid="[id number]"Difficulty urinating No. No" label="Blood in urine" value="" options="no,yes" propid="91" itemid="138168" categoryid="138166" encounterid="[id number]"Blood in urine No.  GASTROENTEROLOGY no" label="Abdominal pain" value="" options="no,yes" propid="91" itemid="10496" categoryid="10494" encounterid="[id number]"Abdominal pain no. no" label="Appetite change" value="" options="no,yes" propid="91" itemid="193447" categoryid="10494" encounterid="[id number]"Appetite change no. no" label="Bloating/belching" value="" options="no,yes" propid="91" itemid="193448" categoryid="10494"  encounterid="[id number]"Bloating/belching no. no" label="Blood in stool or on toilet paper" value="" options="no,yes" propid="91" itemid="10503" categoryid="10494" encounterid="[id number]"Blood in stool or on toilet paper no. no" label="Change in bowel movements" value="" options="no,yes" propid="91" itemid="199106" categoryid="10494" encounterid="[id number]"Change in bowel movements no. no" label="Constipation" value="" options="no,yes" propid="91" itemid="10501" categoryid="10494" encounterid="[id number]"Constipation no. no" label="Diarrhea" value="" options="no,yes" propid="91" itemid="10502" categoryid="10494" encounterid="[id number]"Diarrhea no. no" label="Difficulty swallowing" value="" options="no,yes" propid="91" itemid="199104" categoryid="10494" encounterid="[id number]"Difficulty swallowing no. no" label="Nausea" value="" options="no,yes" propid="91" itemid="10499" categoryid="10494" encounterid="[id number]"Nausea no.  FEMALE REPRODUCTIVE no" label="Vulvar pain" value="" options="no,yes" propid="91" itemid="453725" categoryid="10525" encounterid="[id number]"Vulvar pain no. no" label="Vulvar rash" value="" options="no,yes" propid="91" itemid="453726" categoryid="10525" encounterid="[id number]"Vulvar rash no. heavy menses, irregular meses" label="Abnormal vaginal bleeding" value="" options="no, yes" propid="91" itemid="444315" categoryid="10525" encounterid="[id number]"Abnormal vaginal bleeding heavy menses, irregular meses. no" label="Breast pain" value="" options="no,yes" propid="91" itemid="186083" categoryid="10525" encounterid="[id number]"Breast pain no. no" label="Nipple discharge" value="" options="no,yes" propid="91" itemid="186084" categoryid="10525" encounterid="[id number]"Nipple discharge no. no" label="Pain with intercourse" value="" options="no,yes" propid="91" itemid="275823" categoryid="10525" encounterid="[id number]"Pain with intercourse no. no" label="Pelvic pain" value="" options="no,yes" propid="91"  itemid="186082" categoryid="10525" encounterid="[id number]"Pelvic pain no. no" label="Unusual vaginal discharge" value="" options="no,yes" propid="91" itemid="278230" categoryid="10525" encounterid="[id number]"Unusual vaginal discharge no. no" label="Vaginal itching" value="" options="no,yes" propid="91" itemid="278942" categoryid="10525" encounterid="[id number]"Vaginal itching no.  MUSCULOSKELETAL no" label="Muscle aches" value="" options="no,yes" propid="91" itemid="193461" categoryid="10514" encounterid="[id number]"Muscle aches no.  NEUROLOGY no" label="Headache" value="" options="no,yes" propid="91" itemid="12513" categoryid="12512" encounterid="[id number]"Headache no. no" label="Tingling/numbness" value="" options="no,yes" propid="91" itemid="12514" categoryid="12512" encounterid="[id number]"Tingling/numbness no. no" label="Weakness" value="" options="no,yes" propid="91" itemid="193468" categoryid="12512" encounterid="[id number]"Weakness no.  PSYCHOLOGY no" label="Depression" value="" options="" propid="91" itemid="275919" categoryid="10520" encounterid="[id number]"Depression no. no" label="Anxiety" value="" options="no,yes" propid="91" itemid="172748" categoryid="10520" encounterid="[id number]"Anxiety no. no" label="Nervousness"  value="" options="no,yes" propid="91" itemid="199158" categoryid="10520" encounterid="[id number]"Nervousness no. no" label="Sleep disturbances" value="" options="no,yes" propid="91" itemid="12502" categoryid="10520" encounterid="[id number]"Sleep disturbances no. no " label="Suicidal ideation" value="" options="no,yes" propid="91" itemid="72718" categoryid="10520" encounterid="[id number]"Suicidal ideation no .  ENDOCRINOLOGY no" label="Excessive thirst" value="" options="no,yes" propid="91" itemid="194628" categoryid="12508" encounterid="[id number]"Excessive thirst no. no" label="Excessive urination" value="" options="no,yes" propid="91" itemid="196285" categoryid="12508" encounterid="[id number]"Excessive  urination no. no" label="Hair loss" value="" options="no, yes" propid="91" itemid="444314" categoryid="12508" encounterid="[id number]"Hair loss no. no" label="Heat or cold intolerance" value="" options="" propid="91" itemid="447284" categoryid="12508" encounterid="[id number]"Heat or cold intolerance no.  HEMATOLOGY/LYMPH no" label="Abnormal bleeding" value="" options="no,yes" propid="91" itemid="199152" categoryid="138157" encounterid="[id number]"Abnormal bleeding no. no" label="Easy bruising" value="" options="no,yes" propid="91" itemid="170653" categoryid="138157" encounterid="13724765"Easy bruising no. no" label="Swollen glands" value="" options="no,yes" propid="91" itemid="138158" categoryid="138157" encounterid="[id number]"Swollen glands no.  DERMATOLOGY no" label="New/changing skin lesion" value="" options="no,yes" propid="91" itemid="199126" categoryid="12503" encounterid="[id number]"New/changing skin lesion no. no" label="Rash" value="" options="no,yes" propid="91" itemid="12504" categoryid="12503" encounterid="[id number]"Rash no. no" label="Sores" value="" options="" propid="91" itemid="444313" categoryid="12503" encounterid="[id number]"Sores no.  Negative except as stated in HPI.  Objective: Vitals: Wt 243.6, Wt change 2.6 lb, Ht 64, BMI 41.81, BP sitting 150/92.  Past Results: Examination:  General Examination alert, oriented, NAD " label="CONSTITUTIONAL:" categoryPropId="10089" examid="193638"CONSTITUTIONAL: alert, oriented, NAD .  moist, warm" label="SKIN:" categoryPropId="10109" examid="193638"SKIN: moist, warm.  Conjunctiva clear" label="EYES:" categoryPropId="21468" examid="193638"EYES: Conjunctiva clear.  good I:E efffort noted, clear to auscultation bilaterally" label="LUNGS:" categoryPropId="87" examid="193638"LUNGS: good I:E efffort noted, clear to auscultation bilaterally.  regular rate and rhythm" label="HEART:" categoryPropId="86" examid="193638"HEART: regular rate and rhythm.  soft,  non-tender/non-distended, bowel sounds present " label="ABDOMEN:" categoryPropId="88" examid="193638"ABDOMEN: soft, non-tender/non-distended, bowel sounds present .  FEMALE GENITOURINARY: normal external genitalia, labia - unremarkable, vagina - pink moist mucosa, no lesions or abnormal discharge, cervix - no discharge or lesions or CMT, adnexa - no masses or tenderness, uterus - 12 wk size uterus.  affect normal, good eye contact" label="PSYCH:" categoryPropId="16316" examid="193638"PSYCH: affect normal, good eye contact.  Pulmonary Breath sounds Lungs clear to auscultation bilaterally.  Physical Examination: Pt aware of scribe services today.   Assessment: Assessment:  Menorrhagia with irregular cycle - N92.1 (Primary)     Fibroids - D21.9     Plan: Treatment: Menorrhagia with irregular cycle Notes: Pt is scheduled for robotic assisted bilateral hysterectomy w/ bilateral salpingectomy on 06/12/2021. Plan to preserve ovarian function. Discussed risks of hysterectomy including but not limited to infection, bleeding, conversion to larger incision, damage to her bowel, bladder, or ureters, with the need for further surgery. Discussed risk of blood transfusion and risk of HIV or hep B&C (1 out of 2 million and 1 out of 200,000, respectively) with blood transfusion. Pt is aware of risks and desires blood transfusion if needed. She is advised to avoid eating or drinking starting midnight prior to surgery. Discussed post-surgery avoidance of driving for 1 week and avoidance of lifting weight greater than 10 lbs or intercourse for 6-8 weeks after procedure.. Fibroids Notes: Pt is scheduled for robotic assisted bilateral hysterectomy w/ bilateral salpingectomy on 06/12/2021. Plan to preserve ovarian function. Discussed risks of hysterectomy including but not limited to infection, bleeding, conversion to larger incision, damage to her bowel, bladder, or ureters, with the need for further surgery. Discussed  risk of blood transfusion and risk of HIV or hep B&C (1 out of 2 million and 1 out of 200,000, respectively) with blood transfusion. Pt is aware of risks and desires blood transfusion if needed. She is advised to avoid eating or drinking starting midnight prior to surgery. Discussed post-surgery avoidance of driving for 1 week and avoidance of lifting weight greater than 10 lbs or intercourse for 6-8 weeks after  procedure.Marland Kitchen

## 2021-06-11 NOTE — H&P (Deleted)
  The note originally documented on this encounter has been moved the the encounter in which it belongs.  

## 2021-06-12 ENCOUNTER — Ambulatory Visit (HOSPITAL_BASED_OUTPATIENT_CLINIC_OR_DEPARTMENT_OTHER): Payer: Commercial Managed Care - PPO | Admitting: Anesthesiology

## 2021-06-12 ENCOUNTER — Encounter (HOSPITAL_BASED_OUTPATIENT_CLINIC_OR_DEPARTMENT_OTHER)
Admission: RE | Disposition: A | Discharge status: Home / Self Care | Payer: Self-pay | Source: Home / Self Care | Attending: Obstetrics and Gynecology

## 2021-06-12 ENCOUNTER — Other Ambulatory Visit: Payer: Self-pay

## 2021-06-12 ENCOUNTER — Encounter (HOSPITAL_BASED_OUTPATIENT_CLINIC_OR_DEPARTMENT_OTHER): Payer: Self-pay | Admitting: Obstetrics and Gynecology

## 2021-06-12 ENCOUNTER — Observation Stay (HOSPITAL_BASED_OUTPATIENT_CLINIC_OR_DEPARTMENT_OTHER)
Admission: RE | Admit: 2021-06-12 | Discharge: 2021-06-13 | Disposition: A | Discharge status: Home / Self Care | Payer: Commercial Managed Care - PPO | Attending: Obstetrics and Gynecology | Admitting: Obstetrics and Gynecology

## 2021-06-12 DIAGNOSIS — D219 Benign neoplasm of connective and other soft tissue, unspecified: Secondary | ICD-10-CM | POA: Diagnosis present

## 2021-06-12 DIAGNOSIS — D259 Leiomyoma of uterus, unspecified: Principal | ICD-10-CM | POA: Insufficient documentation

## 2021-06-12 DIAGNOSIS — N920 Excessive and frequent menstruation with regular cycle: Secondary | ICD-10-CM | POA: Diagnosis not present

## 2021-06-12 DIAGNOSIS — N838 Other noninflammatory disorders of ovary, fallopian tube and broad ligament: Secondary | ICD-10-CM | POA: Insufficient documentation

## 2021-06-12 DIAGNOSIS — Z90710 Acquired absence of both cervix and uterus: Secondary | ICD-10-CM | POA: Diagnosis present

## 2021-06-12 HISTORY — DX: Presence of spectacles and contact lenses: Z97.3

## 2021-06-12 HISTORY — PX: ROBOTIC ASSISTED LAPAROSCOPIC HYSTERECTOMY AND SALPINGECTOMY: SHX6379

## 2021-06-12 HISTORY — DX: Concussion with loss of consciousness of unspecified duration, initial encounter: S06.0X9A

## 2021-06-12 HISTORY — DX: Concussion with loss of consciousness status unknown, initial encounter: S06.0XAA

## 2021-06-12 LAB — ABO/RH: ABO/RH(D): A POS

## 2021-06-12 LAB — POCT PREGNANCY, URINE: Preg Test, Ur: NEGATIVE

## 2021-06-12 LAB — TYPE AND SCREEN
ABO/RH(D): A POS
Antibody Screen: NEGATIVE

## 2021-06-12 SURGERY — XI ROBOTIC ASSISTED LAPAROSCOPIC HYSTERECTOMY AND SALPINGECTOMY
Anesthesia: General | Site: Abdomen

## 2021-06-12 MED ORDER — LACTATED RINGERS IV SOLN: INTRAVENOUS | Status: DC

## 2021-06-12 MED ORDER — LIDOCAINE HCL (PF) 2 % IJ SOLN
INTRAMUSCULAR | Status: AC
  Filled 2021-06-12: qty 5

## 2021-06-12 MED ORDER — SODIUM CHLORIDE 0.9 % IV SOLN
INTRAVENOUS | Status: DC | PRN
  Administered 2021-06-12: 120 mL

## 2021-06-12 MED ORDER — FENTANYL CITRATE (PF) 100 MCG/2ML IJ SOLN: 25.0000 ug | INTRAMUSCULAR | Status: DC | PRN

## 2021-06-12 MED ORDER — DEXAMETHASONE SODIUM PHOSPHATE 10 MG/ML IJ SOLN
INTRAMUSCULAR | Status: AC
  Filled 2021-06-12: qty 1

## 2021-06-12 MED ORDER — HYDROMORPHONE HCL 1 MG/ML IJ SOLN
INTRAMUSCULAR | Status: AC
  Filled 2021-06-12: qty 1

## 2021-06-12 MED ORDER — CELECOXIB 200 MG PO CAPS
ORAL_CAPSULE | ORAL | Status: AC
  Filled 2021-06-12: qty 2

## 2021-06-12 MED ORDER — SODIUM CHLORIDE 0.9 % IV SOLN
2.0000 g | INTRAVENOUS | Status: AC
  Administered 2021-06-12: 2 g via INTRAVENOUS
  Filled 2021-06-12: qty 2

## 2021-06-12 MED ORDER — IBUPROFEN 600 MG PO TABS: 600.0000 mg | ORAL_TABLET | Freq: Four times a day (QID) | ORAL | 1 refills | Status: DC | PRN

## 2021-06-12 MED ORDER — GLYCOPYRROLATE PF 0.2 MG/ML IJ SOSY
PREFILLED_SYRINGE | INTRAMUSCULAR | Status: AC
  Filled 2021-06-12: qty 1

## 2021-06-12 MED ORDER — OXYCODONE HCL 5 MG PO TABS: 5.0000 mg | ORAL_TABLET | ORAL | 0 refills | Status: DC | PRN

## 2021-06-12 MED ORDER — ZOLPIDEM TARTRATE 5 MG PO TABS: 5.0000 mg | ORAL_TABLET | Freq: Every evening | ORAL | Status: DC | PRN

## 2021-06-12 MED ORDER — PROMETHAZINE HCL 25 MG/ML IJ SOLN: 6.2500 mg | INTRAMUSCULAR | Status: DC | PRN

## 2021-06-12 MED ORDER — ALUM & MAG HYDROXIDE-SIMETH 200-200-20 MG/5ML PO SUSP: 30.0000 mL | ORAL | Status: DC | PRN

## 2021-06-12 MED ORDER — SIMETHICONE 80 MG PO CHEW: 80.0000 mg | CHEWABLE_TABLET | Freq: Four times a day (QID) | ORAL | Status: DC | PRN

## 2021-06-12 MED ORDER — SCOPOLAMINE 1 MG/3DAYS TD PT72
MEDICATED_PATCH | TRANSDERMAL | Status: AC
  Filled 2021-06-12: qty 1

## 2021-06-12 MED ORDER — HYDROMORPHONE HCL 1 MG/ML IJ SOLN
0.2000 mg | INTRAMUSCULAR | Status: DC | PRN
  Administered 2021-06-12: 0.5 mg via INTRAVENOUS

## 2021-06-12 MED ORDER — DEXAMETHASONE SODIUM PHOSPHATE 10 MG/ML IJ SOLN
INTRAMUSCULAR | Status: DC | PRN
  Administered 2021-06-12: 10 mg via INTRAVENOUS

## 2021-06-12 MED ORDER — KETOROLAC TROMETHAMINE 30 MG/ML IJ SOLN
30.0000 mg | Freq: Once | INTRAMUSCULAR | Status: AC
  Administered 2021-06-12: 30 mg via INTRAVENOUS

## 2021-06-12 MED ORDER — ACETAMINOPHEN 500 MG PO TABS
ORAL_TABLET | ORAL | Status: AC
  Filled 2021-06-12: qty 2

## 2021-06-12 MED ORDER — ONDANSETRON HCL 4 MG PO TABS: 4.0000 mg | ORAL_TABLET | Freq: Four times a day (QID) | ORAL | Status: DC | PRN

## 2021-06-12 MED ORDER — PROPOFOL 10 MG/ML IV BOLUS
INTRAVENOUS | Status: DC | PRN
  Administered 2021-06-12: 180 mg via INTRAVENOUS

## 2021-06-12 MED ORDER — IBUPROFEN 200 MG PO TABS
ORAL_TABLET | ORAL | Status: AC
  Filled 2021-06-12: qty 3

## 2021-06-12 MED ORDER — LIDOCAINE HCL (PF) 2 % IJ SOLN
INTRAMUSCULAR | Status: DC | PRN
  Administered 2021-06-12: 1.5 mg/kg/h via INTRADERMAL

## 2021-06-12 MED ORDER — PROPOFOL 10 MG/ML IV BOLUS
INTRAVENOUS | Status: AC
  Filled 2021-06-12: qty 20

## 2021-06-12 MED ORDER — ONDANSETRON HCL 4 MG/2ML IJ SOLN
4.0000 mg | Freq: Four times a day (QID) | INTRAMUSCULAR | Status: DC | PRN
  Administered 2021-06-12: 4 mg via INTRAVENOUS

## 2021-06-12 MED ORDER — ROCURONIUM BROMIDE 10 MG/ML (PF) SYRINGE
PREFILLED_SYRINGE | INTRAVENOUS | Status: DC | PRN
  Administered 2021-06-12: 15 mg via INTRAVENOUS
  Administered 2021-06-12: 70 mg via INTRAVENOUS

## 2021-06-12 MED ORDER — ONDANSETRON HCL 4 MG/2ML IJ SOLN
INTRAMUSCULAR | Status: AC
  Filled 2021-06-12: qty 2

## 2021-06-12 MED ORDER — KETAMINE HCL 50 MG/5ML IJ SOSY
PREFILLED_SYRINGE | INTRAMUSCULAR | Status: AC
  Filled 2021-06-12: qty 5

## 2021-06-12 MED ORDER — EPHEDRINE 5 MG/ML INJ
INTRAVENOUS | Status: AC
  Filled 2021-06-12: qty 10

## 2021-06-12 MED ORDER — LIDOCAINE 2% (20 MG/ML) 5 ML SYRINGE
INTRAMUSCULAR | Status: DC | PRN
  Administered 2021-06-12: 100 mg via INTRAVENOUS

## 2021-06-12 MED ORDER — CELECOXIB 200 MG PO CAPS
400.0000 mg | ORAL_CAPSULE | ORAL | Status: AC
  Administered 2021-06-12: 400 mg via ORAL

## 2021-06-12 MED ORDER — ROCURONIUM BROMIDE 10 MG/ML (PF) SYRINGE
PREFILLED_SYRINGE | INTRAVENOUS | Status: AC
  Filled 2021-06-12: qty 10

## 2021-06-12 MED ORDER — HYDRALAZINE HCL 20 MG/ML IJ SOLN
10.0000 mg | Freq: Once | INTRAMUSCULAR | Status: AC
  Administered 2021-06-12: 10 mg via INTRAVENOUS

## 2021-06-12 MED ORDER — MIDAZOLAM HCL 2 MG/2ML IJ SOLN
INTRAMUSCULAR | Status: AC
  Filled 2021-06-12: qty 2

## 2021-06-12 MED ORDER — SENNA 8.6 MG PO TABS
1.0000 | ORAL_TABLET | Freq: Two times a day (BID) | ORAL | Status: DC
  Administered 2021-06-12 (×2): 8.6 mg via ORAL

## 2021-06-12 MED ORDER — ACETAMINOPHEN 500 MG PO TABS
1000.0000 mg | ORAL_TABLET | ORAL | Status: AC
  Administered 2021-06-12: 1000 mg via ORAL

## 2021-06-12 MED ORDER — KETAMINE HCL 10 MG/ML IJ SOLN
INTRAMUSCULAR | Status: DC | PRN
  Administered 2021-06-12: 30 mg via INTRAVENOUS
  Administered 2021-06-12: 10 mg via INTRAVENOUS

## 2021-06-12 MED ORDER — ACETAMINOPHEN 500 MG PO TABS
1000.0000 mg | ORAL_TABLET | Freq: Four times a day (QID) | ORAL | Status: DC
  Administered 2021-06-12 – 2021-06-13 (×3): 1000 mg via ORAL

## 2021-06-12 MED ORDER — OXYCODONE HCL 5 MG PO TABS
5.0000 mg | ORAL_TABLET | ORAL | Status: DC | PRN
  Administered 2021-06-13: 5 mg via ORAL

## 2021-06-12 MED ORDER — ACETAMINOPHEN 500 MG PO TABS: 1000.0000 mg | ORAL_TABLET | Freq: Once | ORAL | Status: DC

## 2021-06-12 MED ORDER — FENTANYL CITRATE (PF) 250 MCG/5ML IJ SOLN
INTRAMUSCULAR | Status: AC
  Filled 2021-06-12: qty 5

## 2021-06-12 MED ORDER — KETOROLAC TROMETHAMINE 30 MG/ML IJ SOLN
INTRAMUSCULAR | Status: AC
  Filled 2021-06-12: qty 1

## 2021-06-12 MED ORDER — HYDRALAZINE HCL 20 MG/ML IJ SOLN
INTRAMUSCULAR | Status: AC
  Filled 2021-06-12: qty 1

## 2021-06-12 MED ORDER — LIDOCAINE HCL (PF) 2 % IJ SOLN
INTRAMUSCULAR | Status: AC
  Filled 2021-06-12: qty 15

## 2021-06-12 MED ORDER — ONDANSETRON HCL 4 MG/2ML IJ SOLN
INTRAMUSCULAR | Status: DC | PRN
  Administered 2021-06-12: 4 mg via INTRAVENOUS

## 2021-06-12 MED ORDER — SENNA 8.6 MG PO TABS
ORAL_TABLET | ORAL | Status: AC
  Filled 2021-06-12: qty 1

## 2021-06-12 MED ORDER — SCOPOLAMINE 1 MG/3DAYS TD PT72
1.0000 | MEDICATED_PATCH | Freq: Once | TRANSDERMAL | Status: DC
  Administered 2021-06-12: 1.5 mg via TRANSDERMAL

## 2021-06-12 MED ORDER — SODIUM CHLORIDE 0.9 % IR SOLN
Status: DC | PRN
  Administered 2021-06-12: 1000 mL

## 2021-06-12 MED ORDER — POVIDONE-IODINE 10 % EX SWAB: 2.0000 "application " | Freq: Once | CUTANEOUS | Status: DC

## 2021-06-12 MED ORDER — FENTANYL CITRATE (PF) 100 MCG/2ML IJ SOLN
INTRAMUSCULAR | Status: DC | PRN
  Administered 2021-06-12 (×2): 50 ug via INTRAVENOUS
  Administered 2021-06-12: 100 ug via INTRAVENOUS

## 2021-06-12 MED ORDER — MIDAZOLAM HCL 2 MG/2ML IJ SOLN
INTRAMUSCULAR | Status: DC | PRN
  Administered 2021-06-12: 2 mg via INTRAVENOUS

## 2021-06-12 MED ORDER — IBUPROFEN 200 MG PO TABS
600.0000 mg | ORAL_TABLET | Freq: Four times a day (QID) | ORAL | Status: DC
  Administered 2021-06-12 – 2021-06-13 (×3): 600 mg via ORAL

## 2021-06-12 MED ORDER — MENTHOL 3 MG MT LOZG: 1.0000 | LOZENGE | OROMUCOSAL | Status: DC | PRN

## 2021-06-12 SURGICAL SUPPLY — 55 items
ADH SKN CLS APL DERMABOND .7 (GAUZE/BANDAGES/DRESSINGS) ×1
APL SRG 38 LTWT LNG FL B (MISCELLANEOUS) ×1
APPLICATOR ARISTA FLEXITIP XL (MISCELLANEOUS) ×3 IMPLANT
CATH FOLEY 3WAY  5CC 16FR (CATHETERS) ×3
CATH FOLEY 3WAY 5CC 16FR (CATHETERS) ×1 IMPLANT
COVER BACK TABLE 60X90IN (DRAPES) ×3 IMPLANT
COVER TIP SHEARS 8 DVNC (MISCELLANEOUS) ×1 IMPLANT
COVER TIP SHEARS 8MM DA VINCI (MISCELLANEOUS) ×3
DECANTER SPIKE VIAL GLASS SM (MISCELLANEOUS) ×6 IMPLANT
DEFOGGER SCOPE WARMER CLEARIFY (MISCELLANEOUS) ×3 IMPLANT
DERMABOND ADVANCED (GAUZE/BANDAGES/DRESSINGS) ×2
DERMABOND ADVANCED .7 DNX12 (GAUZE/BANDAGES/DRESSINGS) ×1 IMPLANT
DILATOR CANAL MILEX (MISCELLANEOUS) ×3 IMPLANT
DRAPE ARM DVNC X/XI (DISPOSABLE) ×4 IMPLANT
DRAPE COLUMN DVNC XI (DISPOSABLE) ×1 IMPLANT
DRAPE DA VINCI XI ARM (DISPOSABLE) ×12
DRAPE DA VINCI XI COLUMN (DISPOSABLE) ×3
DRAPE UTILITY 15X26 TOWEL STRL (DRAPES) ×3 IMPLANT
DURAPREP 26ML APPLICATOR (WOUND CARE) ×3 IMPLANT
ELECT REM PT RETURN 9FT ADLT (ELECTROSURGICAL) ×3
ELECTRODE REM PT RTRN 9FT ADLT (ELECTROSURGICAL) ×1 IMPLANT
GLOVE SURG ENC TEXT LTX SZ6.5 (GLOVE) ×9 IMPLANT
GLOVE SURG POLYISO LF SZ7 (GLOVE) ×6 IMPLANT
GLOVE SURG UNDER POLY LF SZ6.5 (GLOVE) ×18 IMPLANT
GLOVE SURG UNDER POLY LF SZ7 (GLOVE) ×18 IMPLANT
HEMOSTAT ARISTA ABSORB 3G PWDR (HEMOSTASIS) ×3 IMPLANT
IRRIG SUCT STRYKERFLOW 2 WTIP (MISCELLANEOUS) ×3
IRRIGATION SUCT STRKRFLW 2 WTP (MISCELLANEOUS) ×1 IMPLANT
KIT TURNOVER CYSTO (KITS) ×3 IMPLANT
LEGGING LITHOTOMY PAIR STRL (DRAPES) ×3 IMPLANT
OBTURATOR OPTICAL STANDARD 8MM (TROCAR) ×3
OBTURATOR OPTICAL STND 8 DVNC (TROCAR) ×1
OBTURATOR OPTICALSTD 8 DVNC (TROCAR) ×1 IMPLANT
OCCLUDER COLPOPNEUMO (BALLOONS) ×3 IMPLANT
PACK ROBOT WH (CUSTOM PROCEDURE TRAY) ×3 IMPLANT
PACK ROBOTIC GOWN (GOWN DISPOSABLE) ×3 IMPLANT
PACK TRENDGUARD 450 HYBRID PRO (MISCELLANEOUS) ×1 IMPLANT
PAD OB MATERNITY 4.3X12.25 (PERSONAL CARE ITEMS) ×3 IMPLANT
PAD PREP 24X48 CUFFED NSTRL (MISCELLANEOUS) ×3 IMPLANT
PROTECTOR NERVE ULNAR (MISCELLANEOUS) ×3 IMPLANT
SEAL CANN UNIV 5-8 DVNC XI (MISCELLANEOUS) ×4 IMPLANT
SEAL XI 5MM-8MM UNIVERSAL (MISCELLANEOUS) ×12
SEALER VESSEL DA VINCI XI (MISCELLANEOUS) ×3
SEALER VESSEL EXT DVNC XI (MISCELLANEOUS) ×1 IMPLANT
SET TRI-LUMEN FLTR TB AIRSEAL (TUBING) ×3 IMPLANT
SUT VIC AB 0 CT1 27 (SUTURE) ×6
SUT VIC AB 0 CT1 27XBRD ANBCTR (SUTURE) ×2 IMPLANT
SUT VICRYL RAPIDE 4/0 PS 2 (SUTURE) ×9 IMPLANT
SUT VLOC 180 0 9IN  GS21 (SUTURE) ×3
SUT VLOC 180 0 9IN GS21 (SUTURE) ×1 IMPLANT
TIP UTERINE 6.7X10CM GRN DISP (MISCELLANEOUS) ×3 IMPLANT
TOWEL OR 17X26 10 PK STRL BLUE (TOWEL DISPOSABLE) ×3 IMPLANT
TRENDGUARD 450 HYBRID PRO PACK (MISCELLANEOUS) ×3
TROCAR PORT AIRSEAL 8X120 (TROCAR) ×3 IMPLANT
WATER STERILE IRR 500ML POUR (IV SOLUTION) ×3 IMPLANT

## 2021-06-12 NOTE — Op Note (Signed)
06/12/2021  1:35 PM  PATIENT:  Ann Watson  50 y.o. female  PRE-OPERATIVE DIAGNOSIS:  Fibroids (D21.9) Menorrhagia with Regular Cycle (N92.0)  POST-OPERATIVE DIAGNOSIS:  Fibroids (D21.9) Menorrhagia with Regular Cycle (N92.0)  PROCEDURE:  Procedure(s): XI ROBOTIC ASSISTED LAPAROSCOPIC HYSTERECTOMY AND BILATERAL SALPINGECTOMY (N/A)  SURGEON:  Surgeon(s) and Role:    Christophe Louis, MD - Primary  PHYSICIAN ASSISTANT:   ASSISTANTS: Gaylord Shih RNFA assisted due to complexity of the the anatomy    ANESTHESIA:   general  EBL:  50 mL   BLOOD ADMINISTERED:none  DRAINS: Urinary Catheter (Foley)   LOCAL MEDICATIONS USED:  OTHER Ropivicaine   SPECIMEN:  Source of Specimen:  Uterus cervix and bilateral fallopian tubes   DISPOSITION OF SPECIMEN:  PATHOLOGY  COUNTS:  YES  TOURNIQUET:  * No tourniquets in log *  DICTATION: .Note written in EPIC  PLAN OF CARE: Admit for overnight observation  PATIENT DISPOSITION:  PACU - hemodynamically stable.   Delay start of Pharmacological VTE agent (>24hrs) due to surgical blood loss or risk of bleeding: not applicable  Findings: Normal appearing external genitalia vaginal mucosa and cervix. Enlarged fibroid uterus. Normal appearing fallopian tubes. Simple cyst on the right ovary 2 cm in size. The left ovary appeared normal.   Procedure: The patient was taken to the operating room where she was placed under general anesthesia.Time out was performed. Marland Kitchen She was placed in dorsal lithotomy position and prepped and draped in the usual sterile fashion. A weighted speculum was placed into the vagina. A Deaver was placed anteriorly for retraction. The anterior lip of the cervix was grasped with a single-tooth tenaculum. The vaginal mucosa was injected with 2.5 cc of ropivacaine at the 2/4/ 8 and 10 o'clock positions. The uterus was sounded to 10  cm. the cervix was dilated to 6 mm . 0 vicryl suture placed at the 12 and 6:00 positions Of the cervix  to facilitate placement of a Ru mi uterine manipulator. The manipulator was placed without difficulty. Weighted speculum and Deaver were removed .   Attention was turned to the patient's abdomen where a 8 mm trocar was placed 2 cm above the umbilicus. under direct visualization . The pneumoperitoneum was achieved with PCO2 gas. The laparoscope was removed. 60 cc of ropivacaine were injected into the abdominal cavity. The laparoscope was reinserted. An 8 mm trocar was placed in the right upper quadrant 16 centimeters from the umbilicus.later connected to robotic arm #4). An 8MM incision was made in the Right upper quadrant TROCAR WAS PLACED 8 cm from the umbilicus. Later connected to robotic arm #3. An 8 mm incision was made in the left upper quadrant 16 cm from the umbilicus and connected to robot arm #1. Marland Kitchen Attention was turned to the left upper quadrant where a 8 mm midclavicular assistant trocar was placed. ( All incision sites were injected with 10cc of ropivacaine prior to port placement. )   Once all ports had been placed under direct visualization.The laparoscope was removed and the [place] robotic system was thin right-sided docked. The robotic arms were connected to the corresponding trocars as listed above. The laparoscope was then reinserted. The long tip bipolar forceps were placed into port #1. The pro grasped placed in the port #4. A vessel sealer was placed in port #3( later alternated with Scissors) . All instruments were directed into the pelvis under direct visualization.   Attention was turned to the surgeons console.. The left mesosalpinx and left utero-ovarian ligament  was cauterized and transected with the vessel sealer The broad ligament was cauterized and transected with the vessel sealer .The round ligament was cauterized and transected with the vessel sealer  The anterior leaf of broad ligament was incised along the bladder reflection to the midline.  The right  mesosalpinx and right  utero-ovarian ligament was cauterized and transected with the vessel sealer. The right broad ligament was cauterized and transected with the vessel sealer. The right round ligament was cauterized and transected with the vessel sealer The broad ligament was incised to the midline. The bladder was dissected off the lower uterine segments of the cervix via sharp and blunt dissection.   The uterine arteries were skeleton bilaterally. They were  cauterized and transected with the vessel sealer The KOH ring was identified. The anterior colpotomy was performed followed by the posterior colpotomy. Once the uterus,cervix and bilateral fallopian tubes were completely excised was removed through the vagina. The  bipolar forceps and scissors were removed and log tip forceps were placed in the port #1 and the needle driver was placed in to port #3.  The vaginal cuff was closed with running suture if 0 v-lock. The pelvis was irrigated. Marland KitchenMarland KitchenMarland KitchenExcellent hemostasis was noted. Arista was placed along the vaginal cuff.  All pelvic pedicles were examined and hemostasis was noted.   All instruments removed from the ports. All ports were removed under direct Visualization. The pneumoperitoneum was released. The skin incisions were closed with 4-0 Vicryl and then covered with Derma bond.     Sponge lap and needle counts weIre correct x. The patient was awakened from anesthesia and taken to the recovery room in stable condition.

## 2021-06-12 NOTE — Anesthesia Preprocedure Evaluation (Addendum)
Anesthesia Evaluation  Patient identified by MRN, date of birth, ID band Patient awake    Reviewed: Allergy & Precautions, NPO status , Patient's Chart, lab work & pertinent test results  Airway Mallampati: I  TM Distance: >3 FB Neck ROM: Limited    Dental  (+) Teeth Intact, Dental Advisory Given   Pulmonary neg pulmonary ROS, former smoker,    Pulmonary exam normal breath sounds clear to auscultation       Cardiovascular negative cardio ROS Normal cardiovascular exam Rhythm:Regular Rate:Normal     Neuro/Psych  Neuromuscular disease    GI/Hepatic negative GI ROS, Neg liver ROS,   Endo/Other  Obesity   Renal/GU negative Renal ROS     Musculoskeletal negative musculoskeletal ROS (+)   Abdominal   Peds  Hematology negative hematology ROS (+)   Anesthesia Other Findings Day of surgery medications reviewed with the patient.  Reproductive/Obstetrics Fibroids, menorrhagia                             Anesthesia Physical Anesthesia Plan  ASA: 2  Anesthesia Plan: General   Post-op Pain Management:    Induction: Intravenous  PONV Risk Score and Plan: 4 or greater and Scopolamine patch - Pre-op, Midazolam, Dexamethasone and Ondansetron  Airway Management Planned: Oral ETT and Video Laryngoscope Planned  Additional Equipment:   Intra-op Plan:   Post-operative Plan: Extubation in OR  Informed Consent: I have reviewed the patients History and Physical, chart, labs and discussed the procedure including the risks, benefits and alternatives for the proposed anesthesia with the patient or authorized representative who has indicated his/her understanding and acceptance.     Dental advisory given  Plan Discussed with: CRNA  Anesthesia Plan Comments:         Anesthesia Quick Evaluation

## 2021-06-12 NOTE — H&P (Signed)
Date of Initial H&P: 06/11/2021  History reviewed, patient examined, no change in status, stable for surgery.

## 2021-06-12 NOTE — Anesthesia Procedure Notes (Signed)
Procedure Name: Intubation Date/Time: 06/12/2021 11:17 AM Performed by: Mechele Claude, CRNA Pre-anesthesia Checklist: Patient identified, Emergency Drugs available, Suction available and Patient being monitored Patient Re-evaluated:Patient Re-evaluated prior to induction Oxygen Delivery Method: Circle system utilized Preoxygenation: Pre-oxygenation with 100% oxygen Induction Type: IV induction Ventilation: Mask ventilation without difficulty Laryngoscope Size: Glidescope and 3 Grade View: Grade I Tube type: Oral Tube size: 7.0 mm Number of attempts: 1 Airway Equipment and Method: Video-laryngoscopy, Rigid stylet and Oral airway Placement Confirmation: ETT inserted through vocal cords under direct vision, positive ETCO2 and breath sounds checked- equal and bilateral Secured at: 22 cm Tube secured with: Tape Dental Injury: Teeth and Oropharynx as per pre-operative assessment  Comments: Glidescope on first try. AOI.

## 2021-06-12 NOTE — Transfer of Care (Signed)
Immediate Anesthesia Transfer of Care Note  Patient: Ann Watson  Procedure(s) Performed: Procedure(s) (LRB): XI ROBOTIC ASSISTED LAPAROSCOPIC HYSTERECTOMY AND BILATERAL SALPINGECTOMY (N/A)  Patient Location: PACU  Anesthesia Type: General  Level of Consciousness: awake, alert  and oriented  Airway & Oxygen Therapy: Patient Spontanous Breathing and Patient connected to nasal cannula oxygen  Post-op Assessment: Report given to PACU RN and Post -op Vital signs reviewed and stable  Post vital signs: Reviewed and stable  Complications: No apparent anesthesia complications  Last Vitals:  Vitals Value Taken Time  BP 179/97 06/12/21 1330  Temp    Pulse 54 06/12/21 1332  Resp 19 06/12/21 1332  SpO2 99 % 06/12/21 1332  Vitals shown include unvalidated device data.  Last Pain:  Vitals:   06/12/21 0926  TempSrc: Oral  PainSc: 3       Patients Stated Pain Goal: 3 (08/81/10 3159)  Complications: No notable events documented.

## 2021-06-13 DIAGNOSIS — D259 Leiomyoma of uterus, unspecified: Secondary | ICD-10-CM | POA: Diagnosis not present

## 2021-06-13 LAB — CBC
HCT: 40.3 % (ref 36.0–46.0)
Hemoglobin: 12.5 g/dL (ref 12.0–15.0)
MCH: 27.4 pg (ref 26.0–34.0)
MCHC: 31 g/dL (ref 30.0–36.0)
MCV: 88.4 fL (ref 80.0–100.0)
Platelets: 263 10*3/uL (ref 150–400)
RBC: 4.56 MIL/uL (ref 3.87–5.11)
RDW: 14 % (ref 11.5–15.5)
WBC: 18.5 10*3/uL — ABNORMAL HIGH (ref 4.0–10.5)
nRBC: 0 % (ref 0.0–0.2)

## 2021-06-13 MED ORDER — OXYCODONE HCL 5 MG PO TABS
ORAL_TABLET | ORAL | Status: AC
  Filled 2021-06-13: qty 1

## 2021-06-13 MED ORDER — IBUPROFEN 200 MG PO TABS
ORAL_TABLET | ORAL | Status: AC
  Filled 2021-06-13: qty 3

## 2021-06-13 MED ORDER — ACETAMINOPHEN 500 MG PO TABS
ORAL_TABLET | ORAL | Status: AC
  Filled 2021-06-13: qty 2

## 2021-06-13 NOTE — Anesthesia Postprocedure Evaluation (Signed)
Anesthesia Post Note  Patient: Agricultural engineer  Procedure(s) Performed: XI ROBOTIC ASSISTED LAPAROSCOPIC HYSTERECTOMY AND BILATERAL SALPINGECTOMY (Abdomen)     Patient location during evaluation: PACU Anesthesia Type: General Level of consciousness: awake and alert Pain management: pain level controlled Vital Signs Assessment: post-procedure vital signs reviewed and stable Respiratory status: spontaneous breathing, nonlabored ventilation, respiratory function stable and patient connected to nasal cannula oxygen Cardiovascular status: blood pressure returned to baseline and stable Postop Assessment: no apparent nausea or vomiting Anesthetic complications: no   No notable events documented.  Last Vitals:  Vitals:   06/13/21 0215 06/13/21 0529  BP: 134/64 (!) 148/80  Pulse: 81 88  Resp: 20 20  Temp: 36.8 C 37.1 C  SpO2: 94% 96%    Last Pain:  Vitals:   06/13/21 0529  TempSrc:   PainSc: Sac

## 2021-06-14 ENCOUNTER — Encounter (HOSPITAL_BASED_OUTPATIENT_CLINIC_OR_DEPARTMENT_OTHER): Payer: Self-pay | Admitting: Obstetrics and Gynecology

## 2021-06-14 LAB — SURGICAL PATHOLOGY

## 2021-06-21 NOTE — Discharge Summary (Signed)
Physician Discharge Summary  Patient ID: Ann Watson MRN: [id number] DOB/AGE: 52-Oct-1972 52 y.o.  Admit date: 06/12/2021 Discharge date: 06/13/2021  Admission Diagnoses: Abnormal Uterine bleeding/ Uterine Fibroids   Discharge Diagnoses:  Active Problems:   S/P laparoscopic hysterectomy   Fibroids   Discharged Condition: stable  Hospital Course: pt was admitted for observation after undergoing a robotic assisted laparoscopic hysterectomy with bilateral salpingectomy . She did well postoperatively with return of bowel and bladder function.   Consults: None  Significant Diagnostic Studies: labs: hgb 12.5 on pod #1  Treatments: surgery: robotic assisted laparoscopic hysterectomy with bilateral salpingectomy.   Discharge Exam: Blood pressure (!) 145/82, pulse 79, temperature 98.2 F (36.8 C), resp. rate 18, height 5\' 4"  (1.626 m), weight 97.1 kg, last menstrual period 05/23/2021, SpO2 97 %. General appearance: alert, cooperative, and no distress Resp: no distress  GI: soft appropriately tender nondistended  Extremities: extremities normal, atraumatic, no cyanosis or edema Incision/Wound: well approximated no erythema or exudate   Disposition: Discharge disposition: 01-Home or Self Care       Discharge Instructions     Call MD for:  persistant nausea and vomiting   Complete by: As directed    Call MD for:  redness, tenderness, or signs of infection (pain, swelling, redness, odor or green/yellow discharge around incision site)   Complete by: As directed    Call MD for:  severe uncontrolled pain   Complete by: As directed    Call MD for:  temperature >100.4   Complete by: As directed    Diet general   Complete by: As directed    Driving Restrictions   Complete by: As directed    Avoid driving for 1 week   Increase activity slowly   Complete by: As directed    Lifting restrictions   Complete by: As directed    Avoid lifting over 10 lbs   May shower / Bathe    Complete by: As directed    May walk up steps   Complete by: As directed    No dressing needed   Complete by: As directed    Sexual Activity Restrictions   Complete by: As directed    Avoid sex for 6-8 weeks and until approved by Dr. Landry Mellow      Allergies as of 06/13/2021       Reactions   Bee Venom Anaphylaxis   Kiwi Extract Anaphylaxis   Mango Flavor Hives   Oil in mango        Medication List     STOP taking these medications    MEDROXYPROGESTERONE ACETATE PO       TAKE these medications    acetaminophen 325 MG tablet Commonly known as: TYLENOL Take 650 mg by mouth every 6 (six) hours as needed.   EPINEPHrine 0.3 mg/0.3 mL Soaj injection Commonly known as: EPI-PEN Inject 0.3 mg into the muscle as needed for anaphylaxis.   ibuprofen 600 MG tablet Commonly known as: ADVIL Take 1 tablet (600 mg total) by mouth every 6 (six) hours as needed.   oxyCODONE 5 MG immediate release tablet Commonly known as: Oxy IR/ROXICODONE Take 1-2 tablets (5-10 mg total) by mouth every 4 (four) hours as needed for moderate pain.               Discharge Care Instructions  (From admission, onward)           Start     Ordered   06/12/21 0000  No dressing needed  06/12/21 1632            Follow-up Information     Christophe Louis, MD. Go in 2 week(s).   Specialty: Obstetrics and Gynecology Contact information: [address] [place] [address] [phone]                 Signed: Christophe Louis 06/21/2021, 2:08 PM

## 2021-12-29 IMAGING — US US ABDOMEN LIMITED
1 series · 14 of 25 positions shown · non-contrast
Comparison: [DATE] [DATE], [DATE].  [DATE] [DATE], [DATE].

CLINICAL DATA: Acute right upper quadrant abdominal pain.

EXAM:
ULTRASOUND ABDOMEN LIMITED RIGHT UPPER QUADRANT

[Series 1: us abdomen limited · 14 of 36 slices shown]
[im 1/36]
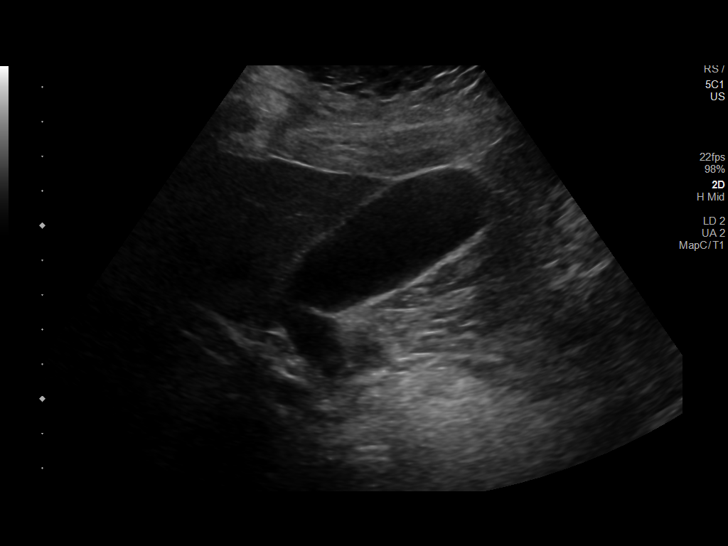
[im 3/36]
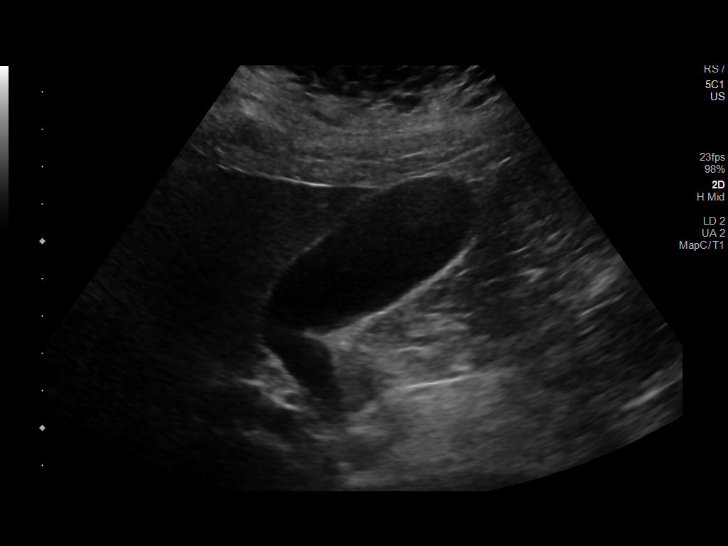
[im 6/36]
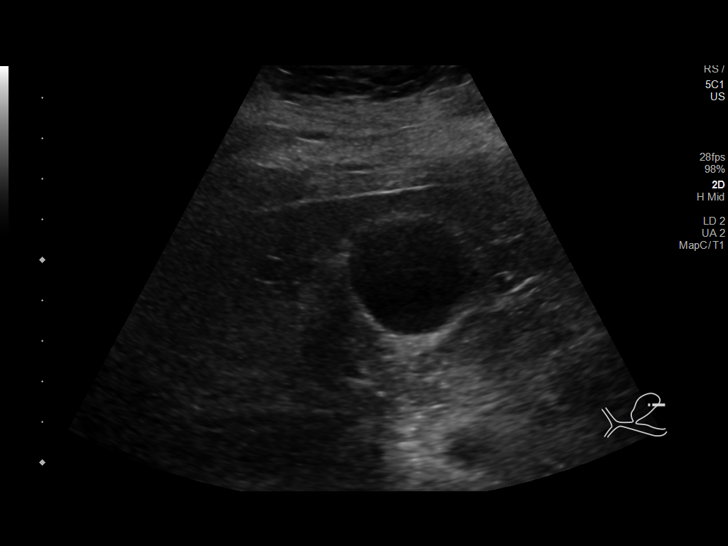
[im 9/36]
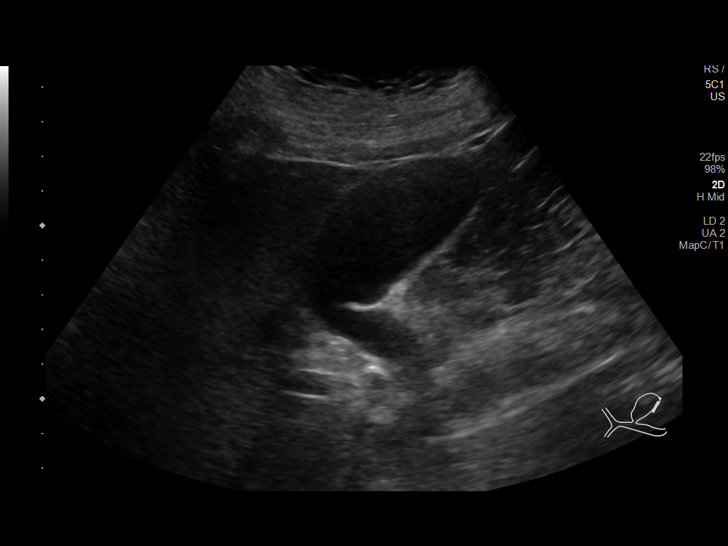
[im 12/36]
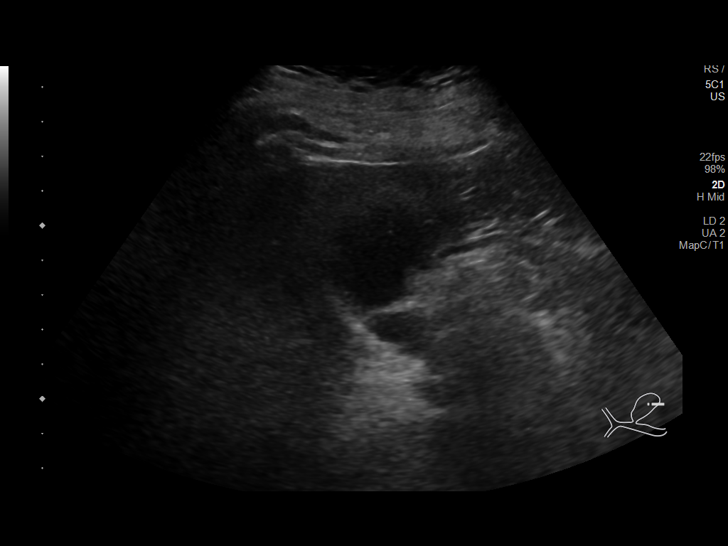
[im 14/36]
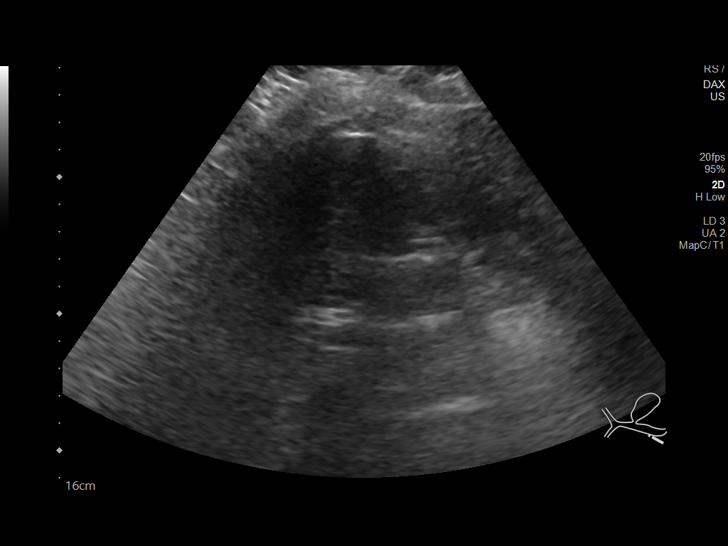
[im 17/36]
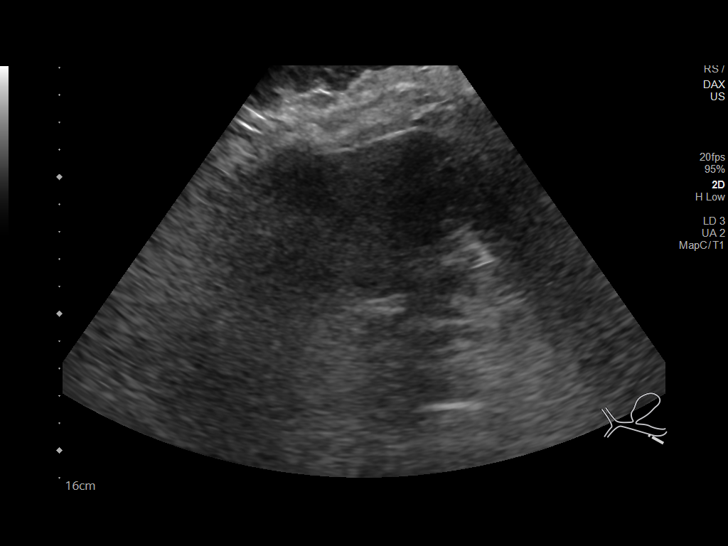
[im 19/36]
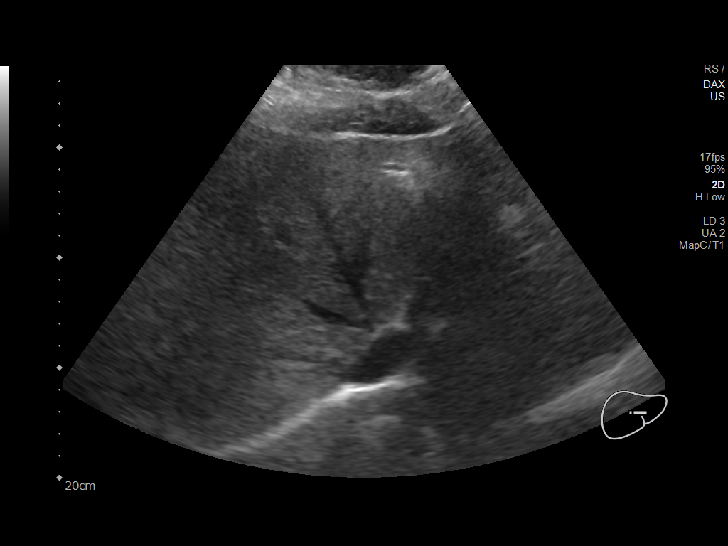
[im 22/36]
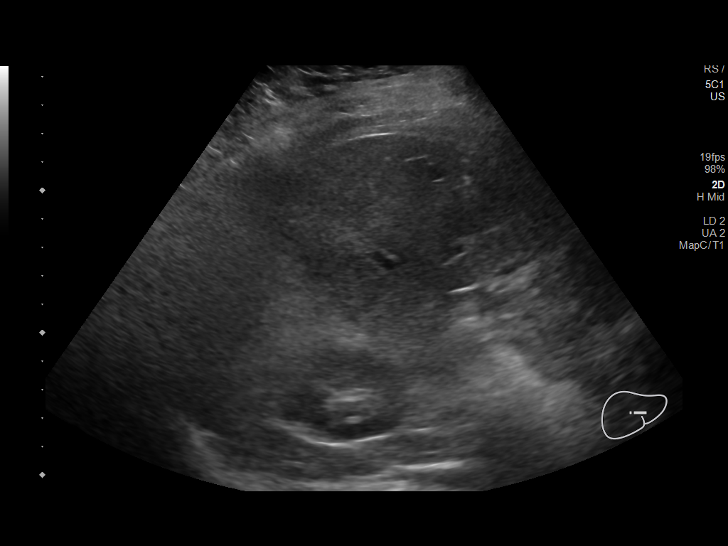
[im 24/36]
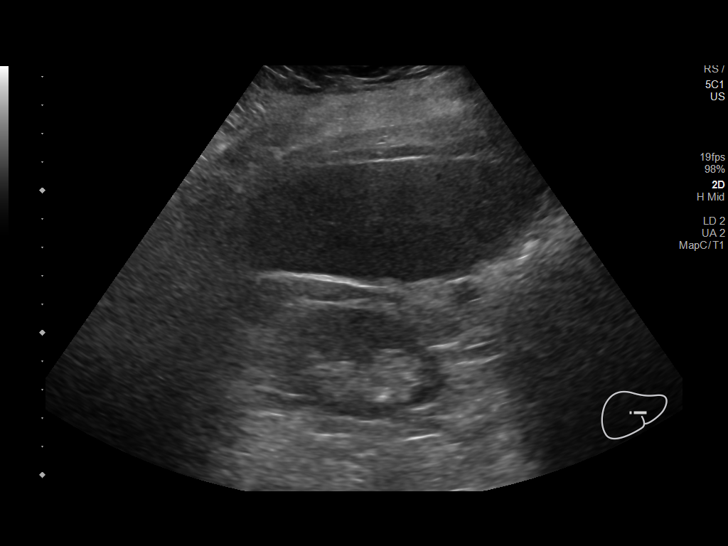
[im 27/36]
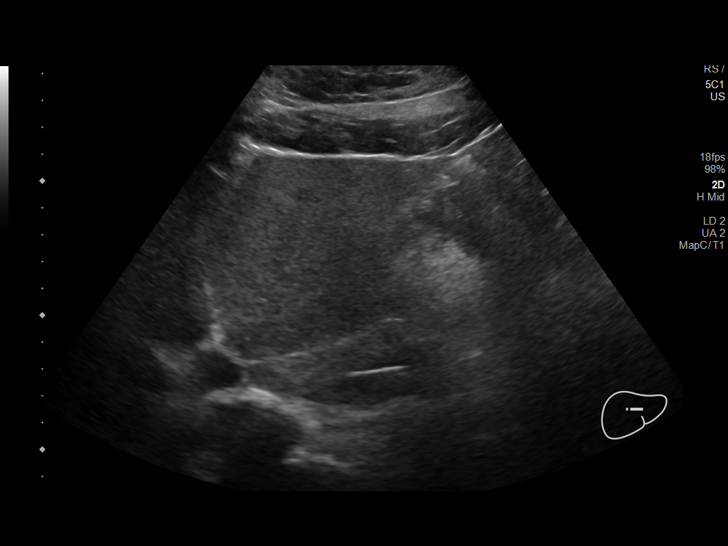
[im 30/36]
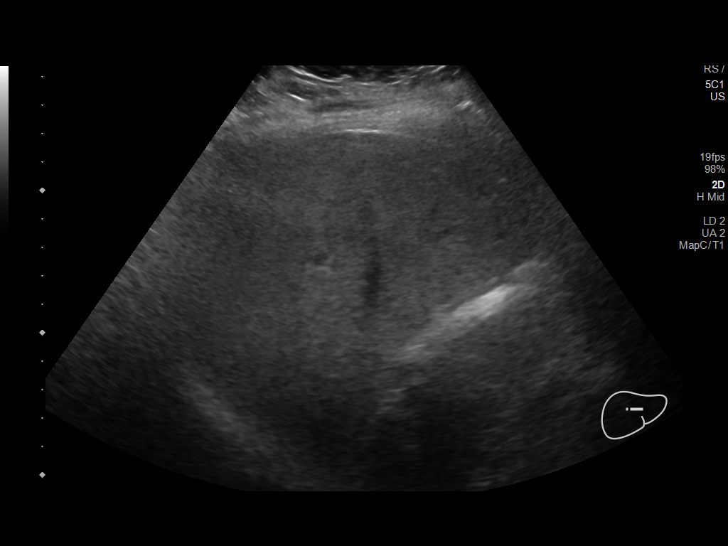
[im 33/36]
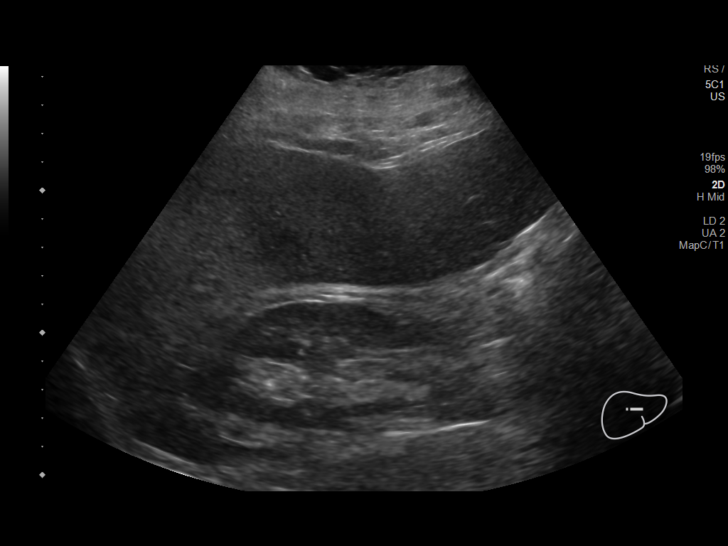
[im 36/36]
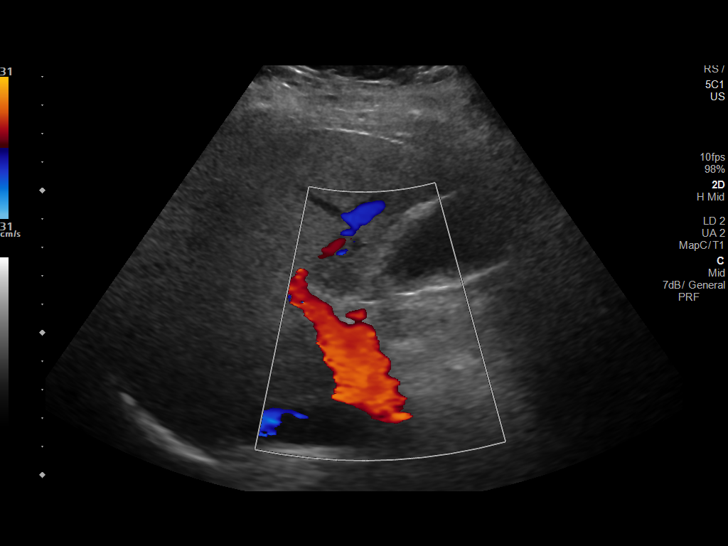

[14 of 25 positions shown; findings below may reference images not displayed]

FINDINGS: Gallbladder:

No gallstones or wall thickening visualized. No sonographic Murphy
sign noted by sonographer.

Common bile duct:

Diameter: 3 mm which is within normal limits.

Liver:

No focal lesion identified. Increased echogenicity of hepatic
parenchyma is noted suggesting hepatic steatosis or other diffuse
hepatocellular disease. Portal vein is patent on color Doppler
imaging with normal direction of blood flow towards the liver.

Other: None.
IMPRESSION: Increased echogenicity of hepatic parenchyma is noted suggesting
hepatic steatosis or other diffuse hepatocellular disease.

## 2022-01-01 ENCOUNTER — Other Ambulatory Visit: Payer: Self-pay

## 2022-01-01 ENCOUNTER — Encounter: Payer: Self-pay | Admitting: Family Medicine

## 2022-01-01 ENCOUNTER — Ambulatory Visit (INDEPENDENT_AMBULATORY_CARE_PROVIDER_SITE_OTHER): Payer: Commercial Managed Care - PPO | Admitting: Family Medicine

## 2022-01-01 VITALS — BP 130/84 | HR 62 | Temp 97.7°F | Ht 63.0 in | Wt 251.6 lb

## 2022-01-01 DIAGNOSIS — N643 Galactorrhea not associated with childbirth: Secondary | ICD-10-CM

## 2022-01-01 NOTE — Patient Instructions (Signed)
Please return for your annual complete physical; please come fasting.   We will call you to get you set up for a diagnostic mammogram and ultrasound of the right breast with [facility].  I will release your lab results to you on your MyChart account with further instructions. Please reply with any questions.    If you have any questions or concerns, please don't hesitate to send me a message via MyChart or call the office at [phone]. Thank you for visiting with Ann Watson today! It's our pleasure caring for you.   Galactorrhea Galactorrhea is an abnormal milky discharge from the breast. The discharge may come from one or both nipples. It is different from the normal milk produced in nursing mothers. Galactorrhea usually occurs in women, but it can sometimes affect men. Galactorrhea can be a sign of something more serious, such as diseases of the kidney or thyroid, or problems with the pituitary gland. Your health care provider may do various tests to help determine the cause. What are the causes? This condition may be caused by: Irritation of the breast, which can result from injury, stimulation during sexual activity, or clothes rubbing against the nipple. Certain prescription medicines. Birth control pills. Certain herbal supplements. Changes in hormone levels. Stress. Sometimes the cause is not known. What are the signs or symptoms? The main symptom of this condition is nipple discharge. The discharge is often white, yellow, or green, and can be seen in one or both breasts. The discharge may flow without stopping, or it may stop and start again. How is this diagnosed? This condition may be diagnosed based on: Collecting and testing the discharge. Blood tests. Imaging tests. Tests to rule out other conditions. How is this treated? In many cases, galactorrhea will go away without treatment. In this case, your health care provider will monitor your condition to make sure that it goes away. When  treatment is required, your health care provider will treat the underlying cause, such as irritation of the nipples or changes in hormone levels. Certain medicines may be stopped, if they are causing your symptoms. Follow these instructions at home: Breast care Watch your condition for any changes. Do not squeeze your breasts or nipples. Avoid breast stimulation during sexual activity. Perform a breast self-exam once a month. Doing this more often can irritate your breasts. Avoid clothes that rub on your nipples. Use breast pads to absorb the discharge. Wear a breast binder or a support bra to help prevent clothes from rubbing on your nipples. General instructions Take over-the-counter and prescription medicines only as told by your health care provider. Keep all follow-up visits. This is important. Contact a health care provider if: You develop hot flashes, vaginal dryness, or a lack of sexual desire. You stop having menstrual periods, or they become irregular or far apart. You have headaches. You have vision problems. Get help right away if: You have breast discharge that is bloody or pus-like. You have breast pain. You feel a lump in your breast. You have wrinkling or dimpling on your breast. You notice that your breast becomes red and swollen. Summary Galactorrhea is an abnormal milky discharge from the breast. The fluid may come from one or both nipples and is often white, yellow, or green. Galactorrhea may be caused by various things, such as irritation of the nipples, medicines, or changes in hormone levels. Galactorrhea often goes away without treatment. However, it also may be a sign of something more serious, such as diseases of the kidney  or thyroid, or problems with the pituitary gland. Get help right away if you have discharge that is bloody or pus-like, if you have breast pain or a lump, or if you have skin changes on your breast. This information is not intended to replace  advice given to you by your health care provider. Make sure you discuss any questions you have with your health care provider. Document Revised: 10/09/2020 Document Reviewed: 10/09/2020 Elsevier Patient Education  2022 [employer].

## 2022-01-01 NOTE — Progress Notes (Signed)
Subjective  CC:  Chief Complaint  Patient presents with   Breast Problem    Discharge from Right Nipple, Clear. Present for 2/3 weeks ago.    HPI: Ann Watson is a 51 y.o. female who presents to the office today to address the problems listed above in the chief complaint. 51 year old perimenopausal female who recently had a partial hysterectomy due to menorrhagia reports several week history of right galactorrhea.  Painless, described as clear without blood.  She does not feel a breast mass.  She denies headaches or visual changes.  No symptoms of low or high thyroid.  No family history of breast cancer.  Her most recent mammogram was done at [facility] and was normal in June 2022.  She has never been on hormone replacement therapy or long-term contraceptives.  She is obese.  She physically feels well.  Assessment  1. Galactorrhea of right breast   2. Morbid obesity ([place]) Chronic     Plan  Unilateral right-sided galactorrhea: Needs further evaluation.  Diagnostic mammogram and focused ultrasound ordered.  Lab work ordered.  She will return tomorrow after hydrating better.  Further eval or treatment plan after study results have returned.  Patient aware and understands treatment plan  Follow up: Due for complete physical. Visit date not found  Orders Placed This Encounter  Procedures   MM Digital Diagnostic Unilat R   US BREAST LTD UNI LEFT INC AXILLA   TSH   Prolactin   Basic metabolic panel   No orders of the defined types were placed in this encounter.     I reviewed the patients updated PMH, FH, and SocHx.    Patient Active Problem List   Diagnosis Date Noted   S/P laparoscopic hysterectomy 06/12/2021   Perimenopausal 09/13/2019   Morbid obesity ([place]) 09/13/2019   Sensorineural hearing loss (SNHL), bilateral 09/10/2018   History of kidney stones 03/09/2018   Cervical radiculopathy 12/21/2015   Constipation, chronic 11/15/2013   Internal hemorrhoids with prolapse/pain  08/07/2011   No outpatient medications have been marked as taking for the 01/01/22 encounter (Office Visit) with [name], MD.    Allergies: Patient is allergic to bee venom, kiwi extract, and mango flavor. Family History: Patient family history includes Arthritis in her maternal grandmother and paternal grandfather; Birth defects in her paternal grandfather; Cancer in her paternal grandfather; Depression in her maternal grandmother; Diabetes in her maternal grandfather and mother; Drug abuse in her father; Early death in her father and paternal grandfather; Heart disease in her maternal grandfather, mother, and paternal grandfather; Hyperlipidemia in her maternal grandmother; Hypertension in her maternal grandmother and mother; Thyroid cancer (age of onset: 22) in her maternal aunt. Social History:  Patient  reports that she quit smoking about 14 years ago. Her smoking use included cigarettes. She has a 34.50 pack-year smoking history. She has never used smokeless tobacco. She reports current alcohol use of about 4.0 - 5.0 standard drinks per week. She reports current drug use. Drug: Marijuana.  Review of Systems: Constitutional: Negative for fever malaise or anorexia Cardiovascular: negative for chest pain Respiratory: negative for SOB or persistent cough Gastrointestinal: negative for abdominal pain  Objective  Vitals: BP 130/84    Pulse 62    Temp 97.7 F (36.5 C) (Temporal)    Ht 5\' 3"  (1.6 m)    Wt 251 lb 9.6 oz (114.1 kg)    SpO2 98%    BMI 44.57 kg/m  General: no acute distress , A&Ox3 Breast: right:  yellowish serous nipple discharge from what appears to be a single duct, mild tenderness inferiorly without clear breast mass.  No axillary lymphadenopathy.  Normal left breast exam.    Commons side effects, risks, benefits, and alternatives for medications and treatment plan prescribed today were discussed, and the patient expressed understanding of the given instructions.  Patient is instructed to call or message via MyChart if he/she has any questions or concerns regarding our treatment plan. No barriers to understanding were identified. We discussed Red Flag symptoms and signs in detail. Patient expressed understanding regarding what to do in case of urgent or emergency type symptoms.  Medication list was reconciled, printed and provided to the patient in AVS. Patient instructions and summary information was reviewed with the patient as documented in the AVS. This note was prepared with assistance of Dragon voice recognition software. Occasional wrong-word or sound-a-like substitutions may have occurred due to the inherent limitations of voice recognition software  This visit occurred during the SARS-CoV-2 public health emergency.  Safety protocols were in place, including screening questions prior to the visit, additional usage of staff PPE, and extensive cleaning of exam room while observing appropriate contact time as indicated for disinfecting solutions.

## 2022-01-02 ENCOUNTER — Other Ambulatory Visit (INDEPENDENT_AMBULATORY_CARE_PROVIDER_SITE_OTHER): Payer: Commercial Managed Care - PPO

## 2022-01-02 DIAGNOSIS — N643 Galactorrhea not associated with childbirth: Secondary | ICD-10-CM | POA: Diagnosis not present

## 2022-01-02 LAB — BASIC METABOLIC PANEL
BUN: 12 mg/dL (ref 6–23)
CO2: 29 mEq/L (ref 19–32)
Calcium: 8.8 mg/dL (ref 8.4–10.5)
Chloride: 102 mEq/L (ref 96–112)
Creatinine, Ser: 0.78 mg/dL (ref 0.40–1.20)
GFR: 87.19 mL/min (ref >60.00)
Glucose, Bld: 91 mg/dL (ref 70–99)
Potassium: 4 mEq/L (ref 3.5–5.1)
Sodium: 139 mEq/L (ref 135–145)

## 2022-01-02 LAB — TSH: TSH: 1.63 u[IU]/mL (ref 0.35–5.50)

## 2022-01-03 LAB — PROLACTIN: Prolactin: 8.1 ng/mL

## 2022-02-11 ENCOUNTER — Telehealth: Payer: Self-pay | Admitting: Family Medicine

## 2022-02-11 ENCOUNTER — Other Ambulatory Visit: Payer: Self-pay | Admitting: *Deleted

## 2022-02-11 NOTE — Telephone Encounter (Signed)
Referrals faxed to [facility] at [phone]

## 2022-02-11 NOTE — Telephone Encounter (Signed)
Patient called stating she has been waiting for [facility] to call her back for ultrasound.Patients referral went out to [facility]. Patient called [facility] and sch an appt for 3/6 for her ultrasound. Patient would like a new referral to be entered and faxed to [facility] at [phone].

## 2022-02-25 ENCOUNTER — Encounter: Payer: Self-pay | Admitting: Family Medicine

## 2022-02-25 LAB — HM MAMMOGRAPHY

## 2022-03-07 ENCOUNTER — Encounter: Payer: Self-pay | Admitting: Family Medicine

## 2022-03-08 ENCOUNTER — Encounter: Payer: Self-pay | Admitting: Family

## 2022-03-08 ENCOUNTER — Ambulatory Visit (INDEPENDENT_AMBULATORY_CARE_PROVIDER_SITE_OTHER): Payer: Commercial Managed Care - PPO | Admitting: Family

## 2022-03-08 VITALS — BP 142/82 | HR 60 | Temp 98.2°F | Ht 63.0 in | Wt 254.0 lb

## 2022-03-08 DIAGNOSIS — M5432 Sciatica, left side: Secondary | ICD-10-CM

## 2022-03-08 MED ORDER — PREDNISONE 10 MG PO TABS: ORAL_TABLET | ORAL | 0 refills | Status: DC

## 2022-03-08 MED ORDER — CYCLOBENZAPRINE HCL 10 MG PO TABS: 10.0000 mg | ORAL_TABLET | Freq: Three times a day (TID) | ORAL | 0 refills | Status: DC | PRN

## 2022-03-08 NOTE — Progress Notes (Signed)
? ?Subjective:  ? ? ? Patient ID: Ann Watson, female    DOB: [birth year], 51 y.o.   MRN: [id number] ? ?Chief Complaint  ?Patient presents with  ? Sciatica  ?  Pt c/o sciatica pain in left hip and she is not able to sleep or walk without it hurting. Pt has tried Ibuprofen and it did not help.   ? ? ?HPI: ?Pain ?She reports new onset lumbar pain. There was not an injury that may have caused the pain. The pain started about a week ago and is gradually worsening. The pain does radiate down left leg. The pain is described as burning, stabbing, and tingling, is severe in intensity, occurring  frequently . Symptoms are worse in the: evening, nighttime  ?Aggravating factors: sitting, walking, and walking uphill She has tried ice & NSAIDs with little relief.  ? ?Health Maintenance Due  ?Topic Date Due  ? Hepatitis C Screening  Never done  ? Zoster Vaccines- Shingrix (1 of 2) Never done  ? Fecal DNA (Cologuard)  Never done  ? COVID-19 Vaccine (3 - Pfizer risk series) 01/26/2021  ? ? ?Past Medical History:  ?Diagnosis Date  ? Concussion   ? age 55, no residual  ? External hemorrhoid, thrombosed 08/07/2011  ? Fibroids 06/04/2021  ? Hemorrhoids   ? History of kidney stones   ? surgery to remove 5-6 yrs ago as of 06-04-2021 per pt  ? HSV infection   ? Wears glasses   ? ? ?Past Surgical History:  ?Procedure Laterality Date  ? DILITATION & CURRETTAGE/HYSTROSCOPY WITH HYDROTHERMAL ABLATION N/A 08/19/2018  ? Procedure: DILATATION & CURETTAGE/HYSTEROSCOPY WITH HYDROTHERMAL ABLATION;  Surgeon: Janyth Pupa, DO;  Location: [facility];  Service: Gynecology;  Laterality: N/A;  ? KIDNEY STONE SURGERY    ? 5 to 6 yrs ago cystoscopy done per pt as of 06-04-2021  ? MULTIPLE TOOTH EXTRACTIONS    ? poor dental last done 2 months ago as of 06-04-2021  ? ORIF ANKLE FRACTURE Right 01/15/2014  ? Procedure: OPEN REDUCTION INTERNAL FIXATION (ORIF) ANKLE FRACTURE;  Surgeon: Meredith Pel, MD;  Location: [facility];  Service: Orthopedics;  Laterality:  Right;  OPEN REDUCTION INTERNAL FIXATION BI-MALLEOLAR ANKLE FRACTURE (SMITH-NEPHEW  ? ROBOTIC ASSISTED LAPAROSCOPIC HYSTERECTOMY AND SALPINGECTOMY N/A 06/12/2021  ? Procedure: XI ROBOTIC ASSISTED LAPAROSCOPIC HYSTERECTOMY AND BILATERAL SALPINGECTOMY;  Surgeon: Christophe Louis, MD;  Location: [facility];  Service: Gynecology;  Laterality: N/A;  ? ? ?Outpatient Medications Prior to Visit  ?Medication Sig Dispense Refill  ? ibuprofen (ADVIL) 800 MG tablet Take 800 mg by mouth 3 (three) times daily.    ? ?No facility-administered medications prior to visit.  ? ? ?Allergies  ?Allergen Reactions  ? Bee Venom Anaphylaxis  ? Kiwi Extract Anaphylaxis  ? Mango Flavor Hives  ?  Oil in mango  ? ?   ?Objective:  ?  ?Physical Exam ?Vitals and nursing note reviewed.  ?Constitutional:   ?   Appearance: Normal appearance.  ?Cardiovascular:  ?   Rate and Rhythm: Normal rate and regular rhythm.  ?Pulmonary:  ?   Effort: Pulmonary effort is normal.  ?   Breath sounds: Normal breath sounds.  ?Musculoskeletal:  ?   Left hip: Tenderness present. Decreased range of motion.  ?Skin: ?   General: Skin is warm and dry.  ?Neurological:  ?   Mental Status: She is alert.  ?Psychiatric:     ?   Mood and Affect: Mood normal.     ?  Behavior: Behavior normal.  ? ? ?BP (!) 142/82 (BP Location: Left Arm, Patient Position: Sitting, Cuff Size: Large)   Pulse 60   Temp 98.2 ?F (36.8 ?C) (Temporal)   Ht '5\' 3"'$  (1.6 m)   Wt 254 lb (115.2 kg)   SpO2 95%   BMI 44.99 kg/m?  ?Wt Readings from Last 3 Encounters:  ?03/08/22 254 lb (115.2 kg)  ?01/01/22 251 lb 9.6 oz (114.1 kg)  ?06/12/21 214 lb 1.6 oz (97.1 kg)  ? ?   ?Assessment & Plan:  ? ?Problem List Items Addressed This Visit   ?None ?Visit Diagnoses   ? ? Sciatica of left side    -  Primary  ? Does wood working, lifting heavy logs, drove to a conference and pain excruciating after, has seen chiropractor which helped a little, given Ibuprofen RX via teledoc but has not helped, limping due to  pain. Sending Prednisone & Flexeril, advised on use & SE. Handouts provided on stretches to do when feeling better to prevent future episodes as well as losing weight. ? ?Relevant Medications  ? predniSONE (DELTASONE) 10 MG tablet  ? cyclobenzaprine (FLEXERIL) 10 MG tablet  ? ?  ? ?Meds ordered this encounter  ?Medications  ? predniSONE (DELTASONE) 10 MG tablet  ?  Sig: Daily in the morning: 5 tab day 1, 4 tab day 2-3, 3 tab day 4, 2 tab day 5-6  ?  Dispense:  20 tablet  ?  Refill:  0  ?  Order Specific Question:   Supervising Provider  ?  Answer:   ANDY, CAMILLE L [[id number]]  ? cyclobenzaprine (FLEXERIL) 10 MG tablet  ?  Sig: Take 1 tablet (10 mg total) by mouth 3 (three) times daily as needed for muscle spasms.  ?  Dispense:  30 tablet  ?  Refill:  0  ?  Order Specific Question:   Supervising Provider  ?  Answer:   ANDY, CAMILLE L [[id number]]  ? ? ?Jeanie Sewer, NP ? ?

## 2022-03-08 NOTE — Patient Instructions (Signed)
It was very nice to see you today! ? ?I have sent a muscle relaxer, Flexeril, and a strong anti-inflammatory, prednisone to your pharmacy. ?Work on the exercises provided as you are able, never stretching past the point of pain. ?Best preventative for future episodes is losing some weight. ? ?Let us know if you are not getting better :-) ? ? ?PLEASE NOTE: ? ?If you had any lab tests please let us know if you have not heard back within a few days. You may see your results on MyChart before we have a chance to review them but we will give you a call once they are reviewed by [place]. If we ordered any referrals today, please let us know if you have not heard from their office within the next week.  ? ?Please try these tips to maintain a healthy lifestyle: ? ?Eat most of your calories during the day when you are active. Eliminate processed foods including packaged sweets (pies, cakes, cookies), reduce intake of potatoes, white bread, white pasta, and white rice. Look for whole grain options, oat flour or almond flour. ? ?Each meal should contain half fruits/vegetables, one quarter protein, and one quarter carbs (no bigger than a computer mouse). ? ?Cut down on sweet beverages. This includes juice, soda, and sweet tea. Also watch fruit intake, though this is a healthier sweet option, it still contains natural sugar! Limit to 3 servings daily. ? ?Drink at least 1 glass of water with each meal and aim for at least 8 glasses per day ? ?Exercise at least 150 minutes every week.  ? ?

## 2022-03-11 ENCOUNTER — Other Ambulatory Visit: Payer: Self-pay

## 2022-03-11 ENCOUNTER — Encounter: Payer: Self-pay | Admitting: Family Medicine

## 2022-03-11 LAB — HM MAMMOGRAPHY: HM Mammogram: ABNORMAL — AB (ref 0–4)

## 2022-03-15 ENCOUNTER — Ambulatory Visit (INDEPENDENT_AMBULATORY_CARE_PROVIDER_SITE_OTHER): Payer: Commercial Managed Care - PPO | Admitting: Family Medicine

## 2022-03-15 VITALS — BP 126/84 | HR 62 | Temp 98.2°F | Ht 63.0 in | Wt 255.0 lb

## 2022-03-15 DIAGNOSIS — Z0000 Encounter for general adult medical examination without abnormal findings: Secondary | ICD-10-CM

## 2022-03-15 DIAGNOSIS — N643 Galactorrhea not associated with childbirth: Secondary | ICD-10-CM | POA: Diagnosis not present

## 2022-03-15 DIAGNOSIS — L719 Rosacea, unspecified: Secondary | ICD-10-CM | POA: Insufficient documentation

## 2022-03-15 DIAGNOSIS — Z1212 Encounter for screening for malignant neoplasm of rectum: Secondary | ICD-10-CM

## 2022-03-15 DIAGNOSIS — Z7185 Encounter for immunization safety counseling: Secondary | ICD-10-CM

## 2022-03-15 DIAGNOSIS — Z1211 Encounter for screening for malignant neoplasm of colon: Secondary | ICD-10-CM | POA: Diagnosis not present

## 2022-03-15 LAB — COMPREHENSIVE METABOLIC PANEL
ALT: 17 U/L (ref 0–35)
AST: 16 U/L (ref 0–37)
Albumin: 4.1 g/dL (ref 3.5–5.2)
Alkaline Phosphatase: 68 U/L (ref 39–117)
BUN: 19 mg/dL (ref 6–23)
CO2: 31 mEq/L (ref 19–32)
Calcium: 8.8 mg/dL (ref 8.4–10.5)
Chloride: 102 mEq/L (ref 96–112)
Creatinine, Ser: 0.81 mg/dL (ref 0.40–1.20)
GFR: 83.21 mL/min (ref >60.00)
Glucose, Bld: 84 mg/dL (ref 70–99)
Potassium: 3.5 mEq/L (ref 3.5–5.1)
Sodium: 140 mEq/L (ref 135–145)
Total Bilirubin: 0.5 mg/dL (ref 0.2–1.2)
Total Protein: 6.9 g/dL (ref 6.0–8.3)

## 2022-03-15 LAB — LIPID PANEL
Cholesterol: 167 mg/dL (ref 0–200)
HDL: 59.3 mg/dL (ref >39.00)
LDL Cholesterol: 89 mg/dL (ref 0–99)
NonHDL: 107.47
Total CHOL/HDL Ratio: 3
Triglycerides: 91 mg/dL (ref 0.0–149.0)
VLDL: 18.2 mg/dL (ref 0.0–40.0)

## 2022-03-15 LAB — CBC WITH DIFFERENTIAL/PLATELET
Basophils Absolute: 0.1 10*3/uL (ref 0.0–0.1)
Basophils Relative: 0.4 % (ref 0.0–3.0)
Eosinophils Absolute: 0.2 10*3/uL (ref 0.0–0.7)
Eosinophils Relative: 1.7 % (ref 0.0–5.0)
HCT: 42.5 % (ref 36.0–46.0)
Hemoglobin: 14 g/dL (ref 12.0–15.0)
Lymphocytes Relative: 24.5 % (ref 12.0–46.0)
Lymphs Abs: 2.8 10*3/uL (ref 0.7–4.0)
MCHC: 32.8 g/dL (ref 30.0–36.0)
MCV: 87.1 fl (ref 78.0–100.0)
Monocytes Absolute: 0.7 10*3/uL (ref 0.1–1.0)
Monocytes Relative: 6.4 % (ref 3.0–12.0)
Neutro Abs: 7.8 10*3/uL — ABNORMAL HIGH (ref 1.4–7.7)
Neutrophils Relative %: 67 % (ref 43.0–77.0)
Platelets: 228 10*3/uL (ref 150.0–400.0)
RBC: 4.88 Mil/uL (ref 3.87–5.11)
RDW: 14.3 % (ref 11.5–15.5)
WBC: 11.6 10*3/uL — ABNORMAL HIGH (ref 4.0–10.5)

## 2022-03-15 LAB — HEMOGLOBIN A1C: Hgb A1c MFr Bld: 5.7 % (ref 4.6–6.5)

## 2022-03-15 MED ORDER — WEGOVY 0.25 MG/0.5ML ~~LOC~~ SOAJ: SUBCUTANEOUS | 5 refills | Status: DC

## 2022-03-15 NOTE — Patient Instructions (Signed)
Please return in 3 months for weight recheck. Can schedule nurse visit for 1st shingrix vaccination.  ? ?I will release your lab results to you on your MyChart account with further instructions. You may see the results before I do, but when I review them I will send you a message with my report or have my assistant call you if things need to be discussed. Please reply to my message with any questions. Thank you!  ? ?I recommend the Cologuard test for your colon cancer screening that is due. I have ordered this test for you. [facility] will soon contact you to verify your insurance, address etc. They will then send you the kit; follow the instructions in the kit and return the kit to [facility]. They will run the test and send the results to me. I will then give you the results. If this test is negative, we recommend repeating a colon cancer screening test in 3 years. If it is positive, I will refer you to a Gastroenterologist so you can get set up for the recommended colonoscopy.  ?Thank you!  ? ?If you have any questions or concerns, please don't hesitate to send me a message via MyChart or call the office at [phone]. Thank you for visiting with [place] today! It's our pleasure caring for you.  ? ?Please do these things to maintain good health! ? ?Exercise at least 30-45 minutes a day,  4-5 days a week.  ?Eat a low-fat diet with lots of fruits and vegetables, up to 7-9 servings per day. ?Drink plenty of water daily. Try to drink 8 8oz glasses per day. ?Seatbelts can save your life. Always wear your seatbelt. ?Place Smoke Detectors on every level of your home and check batteries every year. ?Schedule an appointment with an eye doctor for an eye exam every 1-2 years ?Safe sex - use condoms to protect yourself from STDs if you could be exposed to these types of infections. Use birth control if you do not want to become pregnant and are sexually active. ?Avoid heavy alcohol use. If you drink, keep it to less  than 2 drinks/day and not every day. ?[place].  Choose someone you trust that could speak for you if you became unable to speak for yourself. ?Depression is common in our stressful world.If you're feeling down or losing interest in things you normally enjoy, please come in for a visit. ?If anyone is threatening or hurting you, please get help. Physical or Emotional Violence is never OK.   ?

## 2022-03-15 NOTE — Progress Notes (Signed)
?Subjective  ?Chief Complaint  ?Patient presents with  ? Annual Exam  ?  Pt here for annual Exam and she is not fasting..   ? ? ?HPI: Ann Watson is a 51 y.o. female who presents to [facility] today for a Female Wellness Visit. She also has the concerns and/or needs as listed above in the chief complaint. These will be addressed in addition to the Health Maintenance Visit.  ? ?Wellness Visit: annual visit with health maintenance review and exam without Pap ? ?Health maintenance: Due for colorectal cancer screening.  Declines colonoscopy at this time.  Prefers Cologuard.  Average risk.  Eligible for Shingrix but needs to defer today.  Overall doing well. ?Chronic disease f/u and/or acute problem visit: (deemed necessary to be done in addition to the wellness visit): ?Morbid obesity: Discussed diet and exercise.  Would be interesting in trying weight loss medication. ?She had evaluation for galactorrhea of right breast: Need to get records.  Reportedly, benign biopsies.  Normal lab work ? ? ?Assessment  ?1. Annual physical exam   ?2. Morbid obesity ([place])   ?3. Galactorrhea of right breast   ?4. Vaccine counseling   ?5. Screening for colorectal cancer   ? ?  ?Plan  ?Female Wellness Visit: ?Age appropriate Health Maintenance and Prevention measures were discussed with patient. Included topics are cancer screening recommendations, ways to keep healthy (see AVS) including dietary and exercise recommendations, regular eye and dental care, use of seat belts, and avoidance of moderate alcohol use and tobacco use.  Cologuard for colon cancer testing. ?BMI: discussed patient's BMI and encouraged positive lifestyle modifications to help get to or maintain a target BMI. ?HM needs and immunizations were addressed and ordered. See below for orders. See HM and immunization section for updates.  Return for Shingrix vaccinations ?Routine labs and screening tests ordered including cmp, cbc and lipids  where appropriate. ?Discussed recommendations regarding Vit D and calcium supplementation (see AVS) ? ?Chronic disease management visit and/or acute problem visit: ?Obesity: Wegovy ordered.  Education given.  Discussed diet and exercise.  Recheck 3 months ?Galactorrhea: Benign findings on imaging.  Follow-up as recommended.  We will get records. ?Follow up: 3 months for recheck weight ?Orders Placed This Encounter  ?Procedures  ? CBC with Differential/Platelet  ? Comprehensive metabolic panel  ? Lipid panel  ? Hemoglobin A1c  ? Hepatitis C antibody  ? Cologuard  ? ?Meds ordered this encounter  ?Medications  ? Semaglutide-Weight Management (WEGOVY) 0.25 MG/0.5ML SOAJ  ?  Sig: Inject 0.25 mg into the skin once a week for 28 days, THEN 0.5 mg once a week for 28 days.  ?  Dispense:  2 mL  ?  Refill:  5  ? ?  ? ?Body mass index is 45.17 kg/m?. ?Wt Readings from Last 3 Encounters:  ?03/15/22 255 lb (115.7 kg)  ?03/08/22 254 lb (115.2 kg)  ?01/01/22 251 lb 9.6 oz (114.1 kg)  ? ? ? ?Patient Active Problem List  ? Diagnosis Date Noted  ? Rosacea 03/15/2022  ? S/P laparoscopic hysterectomy 06/12/2021  ? Perimenopausal 09/13/2019  ? Morbid obesity ([place]) 09/13/2019  ? Sensorineural hearing loss (SNHL), bilateral 09/10/2018  ? History of kidney stones 03/09/2018  ? Cervical radiculopathy 12/21/2015  ?  Para March, ortho ?  ? Constipation, chronic 11/15/2013  ? Internal hemorrhoids with prolapse/pain 08/07/2011  ? ?Health Maintenance  ?Topic Date Due  ? Zoster Vaccines- Shingrix (1 of 2) Never done  ? Fecal DNA (  Cologuard)  Never done  ? COVID-19 Vaccine (3 - Pfizer risk series) 03/31/2022 (Originally 01/26/2021)  ? INFLUENZA VACCINE  07/23/2022  ? MAMMOGRAM  02/26/2023  ? TETANUS/TDAP  06/25/2028  ? Hepatitis C Screening  Completed  ? HIV Screening  Completed  ? HPV VACCINES  Aged Out  ? PAP SMEAR-Modifier  Discontinued  ? ?Immunization History  ?Administered Date(s) Administered  ? Influenza, Seasonal, Injecte, Preservative Fre  10/21/2014, 10/13/2015  ? Influenza,inj,Quad PF,6+ Mos 09/13/2019, 09/12/2020  ? Influenza-Unspecified 11/28/2021  ? Moderna SARS-COV2 Booster Vaccination 12/29/2020  ? PFIZER(Purple Top)SARS-COV-2 Vaccination 03/03/2020, 03/27/2020  ? Td 12/23/2010  ? Tdap 01/23/2016, 06/25/2018  ? ?We updated and reviewed the patient's past history in detail and it is documented below. ?Allergies: ?Patient is allergic to bee venom, kiwi extract, and mango flavor. ?Past Medical History ?Patient  has a past medical history of Concussion, External hemorrhoid, thrombosed (08/07/2011), Fibroids (06/04/2021), Hemorrhoids, History of kidney stones, HSV infection, and Wears glasses. ?Past Surgical History ?Patient  has a past surgical history that includes Kidney stone surgery; ORIF ankle fracture (Right, 01/15/2014); Multiple tooth extractions; Dilatation & currettage/hysteroscopy with hydrothermal ablation (N/A, 08/19/2018); and Robotic assisted laparoscopic hysterectomy and salpingectomy (N/A, 06/12/2021). ?Family History: ?Patient family history includes Arthritis in her maternal grandmother and paternal grandfather; Birth defects in her paternal grandfather; Cancer in her paternal grandfather; Depression in her maternal grandmother; Diabetes in her maternal grandfather and mother; Drug abuse in her father; Early death in her father and paternal grandfather; Heart disease in her maternal grandfather, mother, and paternal grandfather; Hyperlipidemia in her maternal grandmother; Hypertension in her maternal grandmother and mother; Thyroid cancer (age of onset: 10) in her maternal aunt. ?Social History:  ?Patient  reports that she quit smoking about 14 years ago. Her smoking use included cigarettes. She has a 34.50 pack-year smoking history. She has never used smokeless tobacco. She reports current alcohol use of about 4.0 - 5.0 standard drinks per week. She reports current drug use. Drug: Marijuana. ? ?Review of Systems: ?Constitutional:  negative for fever or malaise ?Ophthalmic: negative for photophobia, double vision or loss of vision ?Cardiovascular: negative for chest pain, dyspnea on exertion, or new LE swelling ?Respiratory: negative for SOB or persistent cough ?Gastrointestinal: negative for abdominal pain, change in bowel habits or melena ?Genitourinary: negative for dysuria or gross hematuria, no abnormal uterine bleeding or disharge ?Musculoskeletal: negative for new gait disturbance or muscular weakness ?Integumentary: negative for new or persistent rashes, no breast lumps ?Neurological: negative for TIA or stroke symptoms ?Psychiatric: negative for SI or delusions ?Allergic/Immunologic: negative for hives ? ?Patient Care Team  ?  Relationship Specialty Notifications Start End  ?[name], MD PCP - General Family Medicine  09/13/19   ?Meredith Pel, MD Consulting Physician Orthopedic Surgery  09/13/19   ?Ob/Gyn, [facility] Physician Gynecology  09/13/19   ?Christophe Louis, MD Consulting Physician Obstetrics and Gynecology  01/01/22   ? ? ?Objective  ?Vitals: BP 126/84   Pulse 62   Temp 98.2 ?F (36.8 ?C)   Ht '5\' 3"'$  (1.6 m)   Wt 255 lb (115.7 kg)   SpO2 97%   BMI 45.17 kg/m?  ?General:  Well developed, well nourished, no acute distress  ?Psych:  Alert and orientedx3,normal mood and affect ?HEENT:  Normocephalic, atraumatic, non-icteric sclera,  supple neck without adenopathy, mass or thyromegaly ?Cardiovascular:  Normal S1, S2, RRR without gallop, rub or murmur ?Respiratory:  Good breath sounds bilaterally, CTAB with normal respiratory effort ?Gastrointestinal: normal  bowel sounds, soft, non-tender, no noted masses. No HSM ?MSK: no deformities, contusions. Joints are without erythema or swelling.  ?Skin:  Warm, no rashes or suspicious lesions noted ?Neurologic:    Mental status is normal. CN 2-11 are normal. Gross motor and sensory exams are normal. Normal gait. No tremor ? ? ?Commons side effects, risks, benefits, and  alternatives for medications and treatment plan prescribed today were discussed, and the patient expressed understanding of the given instructions. Patient is instructed to call or message via MyChart if he/she

## 2022-03-18 LAB — HEPATITIS C ANTIBODY
Hepatitis C Ab: NONREACTIVE
SIGNAL TO CUT-OFF: 0.2 (ref <1.00)

## 2022-03-22 ENCOUNTER — Telehealth: Payer: Self-pay | Admitting: Family Medicine

## 2022-03-22 NOTE — Telephone Encounter (Signed)
Attempted to fax Biopsy order 3 times. Fax number needs to be changed on Physician order requests. Fax sent at Att: to [place]. ?

## 2022-03-22 NOTE — Telephone Encounter (Signed)
[facility] called re patient - stated Mellita called re biopsy of breast done on 03/11/22- Please fax request to [phone] att Janett Billow as she has not received any requests.  ?

## 2022-03-26 ENCOUNTER — Encounter: Payer: Self-pay | Admitting: Family Medicine

## 2022-04-03 ENCOUNTER — Telehealth: Payer: Self-pay

## 2022-04-03 NOTE — Telephone Encounter (Signed)
PA has been started. Waiting on approval/denial ?

## 2022-04-03 NOTE — Telephone Encounter (Addendum)
PA has been submitted to [employer] thru [employer] '@[id number]'$ [phone]. Waiting on determination. OV notes has been attached and faxed out internally. ? ?Received confirmation on 04/05/2022 that pt has been approved for [facility] 0.'25mg'$ /0.69m pen from 04/04/2022-11/04/2022 and pt hjas been notified. ?

## 2022-04-05 ENCOUNTER — Encounter: Payer: Self-pay | Admitting: Family Medicine

## 2022-04-15 ENCOUNTER — Other Ambulatory Visit: Payer: Self-pay

## 2022-04-15 ENCOUNTER — Telehealth: Payer: Self-pay | Admitting: Family Medicine

## 2022-04-15 MED ORDER — WEGOVY 0.25 MG/0.5ML ~~LOC~~ SOAJ: SUBCUTANEOUS | 5 refills | Status: DC

## 2022-04-15 NOTE — Telephone Encounter (Signed)
Rx resent.

## 2022-04-15 NOTE — Telephone Encounter (Signed)
Pt states she received a letter from her insurance stating she is now approved for [facility]. She states they are requesting to have it sent in again. Please advise ?

## 2022-04-25 LAB — COLOGUARD: COLOGUARD: NEGATIVE

## 2022-06-03 ENCOUNTER — Encounter: Payer: Self-pay | Admitting: Family Medicine

## 2022-06-03 NOTE — Telephone Encounter (Signed)
Pt states the prescription is supposed increase each month but pharmacy states that was not indicated by the orders.   Pt requests orders to be corrected and sent to pharmacy.   Pt is schedule to administer last injection from May refill on Sunday 06/09/22.  Last seen: 03/18/22  Next appt: n/a   Semaglutide-Weight Management (WEGOVY) 0.25 MG/0.5ML SOAJ [[id number]]   [id number] - [place], [place] - [address]  [place], [place] [address]  Phone:  [phone]  Fax:  [phone]

## 2022-06-09 MED ORDER — WEGOVY 0.5 MG/0.5ML ~~LOC~~ SOAJ: 0.5000 mg | SUBCUTANEOUS | 2 refills | Status: DC

## 2022-08-12 ENCOUNTER — Ambulatory Visit (INDEPENDENT_AMBULATORY_CARE_PROVIDER_SITE_OTHER)
Admission: RE | Admit: 2022-08-12 | Discharge: 2022-08-12 | Disposition: A | Discharge status: Home / Self Care | Payer: Commercial Managed Care - PPO | Source: Ambulatory Visit | Attending: Family Medicine | Admitting: Family Medicine

## 2022-08-12 ENCOUNTER — Encounter: Payer: Self-pay | Admitting: Family Medicine

## 2022-08-12 ENCOUNTER — Ambulatory Visit (INDEPENDENT_AMBULATORY_CARE_PROVIDER_SITE_OTHER): Payer: Commercial Managed Care - PPO | Admitting: Family Medicine

## 2022-08-12 VITALS — BP 134/82 | HR 79 | Temp 98.2°F | Ht 63.0 in | Wt 254.6 lb

## 2022-08-12 DIAGNOSIS — M5416 Radiculopathy, lumbar region: Secondary | ICD-10-CM

## 2022-08-12 MED ORDER — GABAPENTIN 300 MG PO CAPS: 300.0000 mg | ORAL_CAPSULE | Freq: Two times a day (BID) | ORAL | 0 refills | Status: DC

## 2022-08-12 MED ORDER — OXYCODONE-ACETAMINOPHEN 5-325 MG PO TABS: 1.0000 | ORAL_TABLET | ORAL | 0 refills | Status: DC | PRN

## 2022-08-12 NOTE — Progress Notes (Signed)
Subjective  CC:  Chief Complaint  Patient presents with   Tailbone Pain    Pt stated that she is having some tailbone pain that runs down to her Lt knee    HPI: Ann Watson is a 51 y.o. female who presents to the office today to address the problems listed above in the chief complaint. 51 year old female complains of severe left-sided low back pain that radiates down to the back of her left knee.  This started approximately 5 days ago.  It has limited her ability to sit for prolonged periods of time, get comfortable at night sleeping, and she is having difficulty walking due to pain.  She had some leftover oxycodone which she has needed to help control the pain.  She did seek care through a telehealth visit: She was prescribed prednisone 40 mg daily for 5 days and cyclobenzaprine to use nightly.  Unfortunately, her pain is persisted.  She denies low back injury, bowel or bladder dysfunction, lower extremity weakness, calf pain.  She has history of sciatica but never this severe.  She has been doing stretches at home but nothing has helped.  Assessment  1. Lumbar radiculopathy      Plan  Lumbar radiculopathy: Severe pain.  We will continue Percocet for pain control, add gabapentin, risks benefits and indications discussed.  Suspect muscle relaxers not really helping so will recommend not using that.  Continue prednisone burst.  Recommend follow-up in 1 week, sooner if any red flag symptoms develop.  Check x-rays today.  May warrant orthopedic referral if no improvement.  Patient understands and agrees with care plan  Follow up: 1 week for recheck Visit date not found  Orders Placed This Encounter  Procedures   DG Lumbar Spine Complete   Meds ordered this encounter  Medications   gabapentin (NEURONTIN) 300 MG capsule    Sig: Take 1 capsule (300 mg total) by mouth 2 (two) times daily.    Dispense:  60 capsule    Refill:  0   oxyCODONE-acetaminophen (PERCOCET/ROXICET) 5-325 MG  tablet    Sig: Take 1 tablet by mouth every 4 (four) hours as needed for severe pain.    Dispense:  30 tablet    Refill:  0      I reviewed the patients updated PMH, FH, and SocHx.    Patient Active Problem List   Diagnosis Date Noted   Rosacea 03/15/2022   S/P laparoscopic hysterectomy 06/12/2021   Perimenopausal 09/13/2019   Morbid obesity ([place]) 09/13/2019   Sensorineural hearing loss (SNHL), bilateral 09/10/2018   History of kidney stones 03/09/2018   Cervical radiculopathy 12/21/2015   Constipation, chronic 11/15/2013   Internal hemorrhoids with prolapse/pain 08/07/2011   Current Meds  Medication Sig   cyclobenzaprine (FLEXERIL) 10 MG tablet Take 1 tablet (10 mg total) by mouth 3 (three) times daily as needed for muscle spasms.   gabapentin (NEURONTIN) 300 MG capsule Take 1 capsule (300 mg total) by mouth 2 (two) times daily.   ibuprofen (ADVIL) 800 MG tablet Take 800 mg by mouth 3 (three) times daily.   meloxicam (MOBIC) 7.5 MG tablet Take 7.5 mg by mouth 2 (two) times daily.   oxyCODONE-acetaminophen (PERCOCET/ROXICET) 5-325 MG tablet Take 1 tablet by mouth every 4 (four) hours as needed for severe pain.   WEGOVY 0.5 MG/0.5ML SOAJ Inject 0.5 mg into the skin once a week.    Allergies: Patient is allergic to bee venom, kiwi extract, and mango flavor. Family History: Patient  family history includes Arthritis in her maternal grandmother and paternal grandfather; Birth defects in her paternal grandfather; Cancer in her paternal grandfather; Depression in her maternal grandmother; Diabetes in her maternal grandfather and mother; Drug abuse in her father; Early death in her father and paternal grandfather; Heart disease in her maternal grandfather, mother, and paternal grandfather; Hyperlipidemia in her maternal grandmother; Hypertension in her maternal grandmother and mother; Thyroid cancer (age of onset: 38) in her maternal aunt. Social History:  Patient  reports that she quit  smoking about 15 years ago. Her smoking use included cigarettes. She has a 34.50 pack-year smoking history. She has never used smokeless tobacco. She reports current alcohol use of about 4.0 - 5.0 standard drinks of alcohol per week. She reports current drug use. Drug: Marijuana.  Review of Systems: Constitutional: Negative for fever malaise or anorexia Cardiovascular: negative for chest pain Respiratory: negative for SOB or persistent cough Gastrointestinal: negative for abdominal pain  Objective  Vitals: BP 134/82   Pulse 79   Temp 98.2 F (36.8 C)   Ht '5\' 3"'$  (1.6 m)   Wt 254 lb 9.6 oz (115.5 kg)   SpO2 95%   BMI 45.10 kg/m  General: Appears uncomfortable, A&Ox3 Back: Difficulty standing up straight due to pain.  Tender left piriformis, no sciatic notch tenderness, left greater trochanteric bursa is tender, negative straight leg raise bilaterally, +2 lower extremity reflexes bilaterally, normal lower extremity strength.    Commons side effects, risks, benefits, and alternatives for medications and treatment plan prescribed today were discussed, and the patient expressed understanding of the given instructions. Patient is instructed to call or message via MyChart if he/she has any questions or concerns regarding our treatment plan. No barriers to understanding were identified. We discussed Red Flag symptoms and signs in detail. Patient expressed understanding regarding what to do in case of urgent or emergency type symptoms.  Medication list was reconciled, printed and provided to the patient in AVS. Patient instructions and summary information was reviewed with the patient as documented in the AVS. This note was prepared with assistance of Dragon voice recognition software. Occasional wrong-word or sound-a-like substitutions may have occurred due to the inherent limitations of voice recognition software  This visit occurred during the SARS-CoV-2 public health emergency.  Safety protocols  were in place, including screening questions prior to the visit, additional usage of staff PPE, and extensive cleaning of exam room while observing appropriate contact time as indicated for disinfecting solutions.

## 2022-08-12 NOTE — Patient Instructions (Signed)
Please return in 1 week for recheck.   Please go to our [facility] office to get your xrays done. You can walk in M-F between 8:30am- noon or 1pm - 5pm. Tell them you are there for xrays ordered by me. They will send me the results, then I will let you know the results with instructions.   Address: [address].  The Xray department is located in the basement.    If you have any questions or concerns, please don't hesitate to send me a message via MyChart or call the office at [phone]. Thank you for visiting with [place] today! It's our pleasure caring for you.   Lumbosacral Radiculopathy Lumbosacral radiculopathy is a condition that involves the spinal nerves and nerve roots in the low back and bottom of the spine. The condition develops when these nerves and nerve roots move out of place or become inflamed and cause symptoms. What are the causes? This condition may be caused by: Pressure from a disk that bulges out of place (herniated disk). A disk is a plate of soft cartilage that separates bones in the spine. Disk changes that occur with age (disk degeneration). A narrowing of the bones of the lower back (spinal stenosis). A tumor. An infection. An injury that places sudden pressure on the disks that cushion the bones of your lower spine. What increases the risk? You are more likely to develop this condition if: You are a female who is 36-57 years old. You are a female who is 36-68 years old. You use improper technique when lifting things. You are overweight or live a sedentary lifestyle. You smoke. Your work requires frequent lifting. You do repetitive activities that strain the spine. What are the signs or symptoms? Symptoms of this condition include: Pain that goes down from your back into your legs (sciatica), usually on one side of the body. This is the most common symptom. The pain may be worse when you sit, cough, or sneeze. Tingling and numbness in your  legs. Muscle weakness in your legs. Loss of bladder control or bowel control. How is this diagnosed? This condition may be diagnosed based on: Your symptoms and medical history. A physical exam. If the pain lasts, you may have tests, such as: MRI. X-ray. CT scan. A type of CT scan used to examine the spinal canal after injecting a dye into your spine (myelogram). A test to measure how electrical impulses move through a nerve (nerve conduction study). A test to measure the electrical activity in muscles (electromyogram). How is this treated? In many cases, treatment is not needed for this condition. With rest, the condition usually gets better over time. If treatment is needed, it may include: Working with a physical therapist to improve strength and flexibility. Taking pain medicine. Applying heat or ice or both to the affected areas. Having chiropractic spinal manipulation. Using transcutaneous electrical nerve stimulation (TENS) therapy. Getting a steroid injection in the spine. Having surgery. This may be needed if other treatments do not help. Different types of surgery may be done depending on the cause of this condition. Follow these instructions at home: Activity Avoid bending and any other activities that make the problem worse. Maintain a proper position when standing or sitting. When standing, keep your upper back and neck straight with your shoulders pulled back. Avoid slouching. When sitting, keep your back straight and relax your shoulders. Do not round your shoulders or pull them backward. Do not sit or stand  in one place for long periods of time. Take brief periods of rest throughout the day. This will reduce your pain. It is usually better to rest by lying down or standing, not sitting. Mix in mild activity or stretching between long periods of rest. This will help to prevent stiffness and pain. Get regular exercise. Ask your health care provider what activities are  safe for you. If you were shown how to do any exercises or stretches, do them as told by your health care provider. You may have to avoid lifting. Ask your health care provider how much you can safely lift. Always use proper lifting technique, which includes: Bending your knees. Keeping the load close to your body. Avoiding twisting. Managing pain If directed, put ice on the affected area. To do this: Put ice in a plastic bag. Place a towel between your skin and the bag. Leave the ice on for 20 minutes, 2-3 times a day. Remove the ice if your skin turns bright red. This is very important. If you cannot feel pain, heat, or cold, you have a greater risk of damage to the area. If directed, apply heat to the affected area as often as told by your health care provider. Use the heat source that your health care provider recommends, such as a moist heat pack or a heating pad. Place a towel between your skin and the heat source. Leave the heat on for 20-30 minutes. Remove the heat if your skin turns bright red. This is especially important if you are unable to feel pain, heat, or cold. You have a greater risk of getting burned. Take over-the-counter and prescription medicines only as told by your health care provider. General instructions Sleep on a firm mattress in a comfortable position. Try lying on your side with your knees slightly bent. If you lie on your back, put a pillow under your knees. Ask your health care provider if the medicine prescribed to you requires you to avoid driving or using machinery. If your health care provider prescribed a diet or exercise program, follow it as told. Keep all follow-up visits. This is important. Contact a health care provider if: Your pain does not get better over time, even when taking pain medicines. Get help right away if: You develop severe pain. Your pain suddenly gets worse. You develop increasing weakness in your legs. You lose the ability to  control your bladder or bowel. You have difficulty walking or balancing. You have a fever. Summary Lumbosacral radiculopathy is a condition that occurs when the spinal nerves and nerve roots in the lower part of the spine move out of place or become inflamed and cause symptoms. Symptoms include pain, numbness, and tingling that go down from your back into your legs (sciatica), muscle weakness, and loss of bladder control or bowel control. If directed, apply ice or heat or both to the affected area as told by your health care provider. Follow instructions about activity, rest, and proper lifting technique. This information is not intended to replace advice given to you by your health care provider. Make sure you discuss any questions you have with your health care provider. Document Revised: 06/14/2021 Document Reviewed: 06/14/2021 Elsevier Patient Education  [place].

## 2022-08-13 ENCOUNTER — Encounter: Payer: Self-pay | Admitting: Family Medicine

## 2022-08-14 ENCOUNTER — Telehealth: Payer: Self-pay | Admitting: Family Medicine

## 2022-08-14 NOTE — Telephone Encounter (Signed)
Patient states she is waiting on results of xray.  States she is in a lot of pain and that Dr. Jonni Sanger had informed her that she could be referred to an orthopedic.  Patient would like to know if referral can be placed?

## 2022-08-15 ENCOUNTER — Ambulatory Visit (INDEPENDENT_AMBULATORY_CARE_PROVIDER_SITE_OTHER): Payer: Commercial Managed Care - PPO | Admitting: Physician Assistant

## 2022-08-15 ENCOUNTER — Encounter: Payer: Self-pay | Admitting: Physician Assistant

## 2022-08-15 DIAGNOSIS — M545 Low back pain, unspecified: Secondary | ICD-10-CM

## 2022-08-15 DIAGNOSIS — M5416 Radiculopathy, lumbar region: Secondary | ICD-10-CM

## 2022-08-15 NOTE — Progress Notes (Signed)
Office Visit Note   Patient: Ann Watson           Date of Birth: Apr 10, 1971           MRN: [id number] Visit Date: 08/15/2022              Requested by: [name], MD [place],  [place] [address] PCP: [name], MD  Low back pain    HPI: The patient is a pleasant 51 year old woman with recurrent left posterior buttock pain that radiates down her leg.  This first began last March.  She was treated with steroids and anti-inflammatories and eventually improved.  The pain returned in the last week after working with a chiropractor up on her back.  She describes the pain as burning and running from her left posterior buttock down her leg to below her knee.  It is now becoming symptomatic enough that she is not able to work because she cannot sit or stand for very long periods of time.  She was treated with another round of steroids which have not improved her.  She was also placed on anti-inflammatory and antispasmodics with no improvement.  She also was taking a small got a gabapentin which may help just a little bit.  Denies any loss of bowel or bladder control  Assessment & Plan: Visit Diagnoses:  1. Bilateral low back pain, unspecified chronicity, unspecified whether sciatica present   2. Radiculopathy, lumbar region     Plan: Chronic recurrent low back pain.  She has failed conservative treatment including 2 rounds of steroids as well as anti-inflammatories chiropractic care and massage therapy.  I recommend an MRI concerns for a bulging or herniated disc.  She is neurologically intact today.  I will review her MRI with Dr. Durward Fortes see if she can be referred for a steroid injection.  X-rays were reviewed that she had last week just showed mild arthritic changes  Follow-Up Instructions: No follow-ups on file.   Ortho Exam  Patient is alert, oriented, no adenopathy, well-dressed, normal affect, normal respiratory effort. Patient is uncomfortable  changes positions from standing to sitting.  She has pain over the left lower back.  Radiates down her leg to just below her leg knee.  Sensation is intact.  She has good strength with resisted dorsiflexion plantarflexion of her ankle extension flexion of her knee and flexion of her hip.  Hip range of motion is fluid  Imaging: No results found. No images are attached to the encounter.  Labs: Lab Results  Component Value Date   HGBA1C 5.7 03/15/2022     Lab Results  Component Value Date   ALBUMIN 4.1 03/15/2022   ALBUMIN 4.1 07/05/2020   ALBUMIN 3.9 09/13/2019    No results found for: "MG" No results found for: "VD25OH"  No results found for: "PREALBUMIN"    Latest Ref Rng & Units 03/15/2022   11:53 AM 06/13/2021    5:27 AM 06/08/2021   12:38 PM  CBC EXTENDED  WBC 4.0 - 10.5 K/uL 11.6  18.5  11.0   RBC 3.87 - 5.11 Mil/uL 4.88  4.56  4.65   Hemoglobin 12.0 - 15.0 g/dL 14.0  12.5  12.7   HCT 36.0 - 46.0 % 42.5  40.3  41.0   Platelets 150.0 - 400.0 K/uL 228.0  263  228   NEUT# 1.4 - 7.7 K/uL 7.8     Lymph# 0.7 - 4.0 K/uL 2.8  There is no height or weight on file to calculate BMI.  Orders:  Orders Placed This Encounter  Procedures   MR Lumbar Spine w/o contrast   No orders of the defined types were placed in this encounter.    Procedures: No procedures performed  Clinical Data: No additional findings.  ROS:  All other systems negative, except as noted in the HPI. Review of Systems  Objective: Vital Signs: There were no vitals taken for this visit.  Specialty Comments:  No specialty comments available.  PMFS History: Patient Active Problem List   Diagnosis Date Noted   Radiculopathy, lumbar region 08/15/2022   Rosacea 03/15/2022   S/P laparoscopic hysterectomy 06/12/2021   Perimenopausal 09/13/2019   Morbid obesity ([place]) 09/13/2019   Sensorineural hearing loss (SNHL), bilateral 09/10/2018   History of kidney stones 03/09/2018   Cervical  radiculopathy 12/21/2015   Constipation, chronic 11/15/2013   Internal hemorrhoids with prolapse/pain 08/07/2011   Past Medical History:  Diagnosis Date   Concussion    age 32, no residual   External hemorrhoid, thrombosed 08/07/2011   Fibroids 06/04/2021   Hemorrhoids    History of kidney stones    surgery to remove 5-6 yrs ago as of 06-04-2021 per pt   HSV infection    Wears glasses     Family History  Problem Relation Age of Onset   Diabetes Mother    Heart disease Mother    Hypertension Mother    Early death Father    Drug abuse Father    Thyroid cancer Maternal Aunt 60   Hyperlipidemia Maternal Grandmother    Hypertension Maternal Grandmother    Arthritis Maternal Grandmother    Depression Maternal Grandmother    Diabetes Maternal Grandfather    Heart disease Maternal Grandfather    Arthritis Paternal Grandfather    Birth defects Paternal Grandfather    Cancer Paternal Grandfather    Early death Paternal Grandfather    Heart disease Paternal Grandfather     Past Surgical History:  Procedure Laterality Date   DILITATION & CURRETTAGE/HYSTROSCOPY WITH HYDROTHERMAL ABLATION N/A 08/19/2018   Procedure: DILATATION & CURETTAGE/HYSTEROSCOPY WITH HYDROTHERMAL ABLATION;  Surgeon: Janyth Pupa, DO;  Location: [facility];  Service: Gynecology;  Laterality: N/A;   KIDNEY STONE SURGERY     5 to 6 yrs ago cystoscopy done per pt as of 06-04-2021   MULTIPLE TOOTH EXTRACTIONS     poor dental last done 2 months ago as of 06-04-2021   ORIF ANKLE FRACTURE Right 01/15/2014   Procedure: OPEN REDUCTION INTERNAL FIXATION (ORIF) ANKLE FRACTURE;  Surgeon: Meredith Pel, MD;  Location: [facility];  Service: Orthopedics;  Laterality: Right;  OPEN REDUCTION INTERNAL FIXATION BI-MALLEOLAR ANKLE FRACTURE (SMITH-NEPHEW   ROBOTIC ASSISTED LAPAROSCOPIC HYSTERECTOMY AND SALPINGECTOMY N/A 06/12/2021   Procedure: XI ROBOTIC ASSISTED LAPAROSCOPIC HYSTERECTOMY AND BILATERAL SALPINGECTOMY;  Surgeon: Christophe Louis,  MD;  Location: [facility];  Service: Gynecology;  Laterality: N/A;   Social History   Occupational History   Occupation: Psychologist, educational: [employer]  Tobacco Use   Smoking status: Former    Packs/day: 1.50    Years: 23.00    Total pack years: 34.50    Types: Cigarettes    Quit date: 08/10/2007    Years since quitting: 15.0   Smokeless tobacco: Never  Vaping Use   Vaping Use: Never used  Substance and Sexual Activity   Alcohol use: Yes    Alcohol/week: 4.0 - 5.0 standard drinks of alcohol  Types: 4 - 5 Glasses of wine per week    Comment: wine -occ   Drug use: Yes    Types: Marijuana    Comment: last used marijuana 3 weeks ago per pt 06-04-2021   Sexual activity: Yes    Birth control/protection: None    Comment: Husband vasectomy

## 2022-08-16 ENCOUNTER — Ambulatory Visit
Admission: RE | Admit: 2022-08-16 | Discharge: 2022-08-16 | Disposition: A | Discharge status: Home / Self Care | Payer: Commercial Managed Care - PPO | Source: Ambulatory Visit | Attending: Physician Assistant | Admitting: Physician Assistant

## 2022-08-16 ENCOUNTER — Other Ambulatory Visit: Payer: Commercial Managed Care - PPO

## 2022-08-16 DIAGNOSIS — M545 Low back pain, unspecified: Secondary | ICD-10-CM

## 2022-08-21 ENCOUNTER — Ambulatory Visit (INDEPENDENT_AMBULATORY_CARE_PROVIDER_SITE_OTHER): Payer: Commercial Managed Care - PPO | Admitting: Orthopaedic Surgery

## 2022-08-21 ENCOUNTER — Ambulatory Visit (INDEPENDENT_AMBULATORY_CARE_PROVIDER_SITE_OTHER): Payer: Commercial Managed Care - PPO | Admitting: Family Medicine

## 2022-08-21 ENCOUNTER — Encounter: Payer: Self-pay | Admitting: Family Medicine

## 2022-08-21 ENCOUNTER — Encounter: Payer: Self-pay | Admitting: Orthopaedic Surgery

## 2022-08-21 VITALS — BP 118/82 | HR 84 | Temp 98.4°F | Ht 63.0 in | Wt 249.4 lb

## 2022-08-21 DIAGNOSIS — M47816 Spondylosis without myelopathy or radiculopathy, lumbar region: Secondary | ICD-10-CM

## 2022-08-21 DIAGNOSIS — M5416 Radiculopathy, lumbar region: Secondary | ICD-10-CM

## 2022-08-21 HISTORY — DX: Spondylosis without myelopathy or radiculopathy, lumbar region: M47.816

## 2022-08-21 MED ORDER — TIRZEPATIDE 5 MG/0.5ML ~~LOC~~ SOAJ: 5.0000 mg | SUBCUTANEOUS | 2 refills | Status: DC

## 2022-08-21 MED ORDER — TIRZEPATIDE 2.5 MG/0.5ML ~~LOC~~ SOAJ: 2.5000 mg | SUBCUTANEOUS | 0 refills | Status: DC

## 2022-08-21 NOTE — Progress Notes (Signed)
Subjective  CC:  Chief Complaint  Patient presents with   Back Pain    Pt here for 1 week F/U with Back pain. Pt stated that it till hurts but not as bad. Dx with arthritis     HPI: Ann Watson is a 51 y.o. female who presents to the office today to address the problems listed above in the chief complaint. Low back pain: See last note.  Patient with severe low back pain radiating to left leg.  She did seek help from orthopedics.  I reviewed 2 office visit notes and MRI report.  Fortunately, her pain is slowly improving.  She is no longer requiring narcotic pain medicine.  She did complete the prednisone taper.  She is no longer taking any medications although sleep is still affected from pain.  She took an occasional ibuprofen.  MRI did show lower lumbar facet arthropathy but no herniated disks or radiculitis.  She will start physical therapy for stretching strengthening exercises.  Weight loss is also recommended.  No bowel or bladder problems. Morbid obesity: We had started him Wegovy several months ago but due to national shortage she is unable to get it.  She is very interested in getting help for weight loss.  She works 87 hours/week and then works on the weekend.  Diet is therefore difficult because she is eats on the run.  Sleep is poor.  She has not exercised because she works many hours.  She is very motivated for change  Assessment  1. Facet arthritis, degenerative, lumbar spine   2. Morbid obesity ([place])      Plan  Severe low back pain due to facet arthropathy: Slowly improving.  Reassured.  Recommend using Flexeril for night to help her pain and sleep.  Also can use ibuprofen as needed.  Physical therapy exercises to be started. Morbid obesity: Had discussion regarding metabolic defects that make obesity more likely.  Ordered Mounjaro to see if she could get a free month sample then to see if it is covered by her insurance.  If not we will go back to [facility] once the national  shortage is improved.  Had long discussion regarding improving diet and starting strengthening exercises.  Discussed importance of sleep  Follow up: 3 months to recheck weight loss. Visit date not found  No orders of the defined types were placed in this encounter.  Meds ordered this encounter  Medications   tirzepatide (MOUNJARO) 2.5 MG/0.5ML Pen    Sig: Inject 2.5 mg into the skin once a week.    Dispense:  2 mL    Refill:  0   tirzepatide (MOUNJARO) 5 MG/0.5ML Pen    Sig: Inject 5 mg into the skin once a week.    Dispense:  2 mL    Refill:  2      I reviewed the patients updated PMH, FH, and SocHx.    Patient Active Problem List   Diagnosis Date Noted   Facet arthritis, degenerative, lumbar spine 08/21/2022   Radiculopathy, lumbar region 08/15/2022   Rosacea 03/15/2022   S/P laparoscopic hysterectomy 06/12/2021   Perimenopausal 09/13/2019   Morbid obesity ([place]) 09/13/2019   Sensorineural hearing loss (SNHL), bilateral 09/10/2018   History of kidney stones 03/09/2018   Cervical radiculopathy 12/21/2015   Constipation, chronic 11/15/2013   Internal hemorrhoids with prolapse/pain 08/07/2011   Current Meds  Medication Sig   tirzepatide (MOUNJARO) 2.5 MG/0.5ML Pen Inject 2.5 mg into the skin once a week.  tirzepatide [facility]) 5 MG/0.5ML Pen Inject 5 mg into the skin once a week.    Allergies: Patient is allergic to bee venom, kiwi extract, and mango flavor. Family History: Patient family history includes Arthritis in her maternal grandmother and paternal grandfather; Birth defects in her paternal grandfather; Cancer in her paternal grandfather; Depression in her maternal grandmother; Diabetes in her maternal grandfather and mother; Drug abuse in her father; Early death in her father and paternal grandfather; Heart disease in her maternal grandfather, mother, and paternal grandfather; Hyperlipidemia in her maternal grandmother; Hypertension in her maternal grandmother and  mother; Thyroid cancer (age of onset: 49) in her maternal aunt. Social History:  Patient  reports that she quit smoking about 15 years ago. Her smoking use included cigarettes. She has a 34.50 pack-year smoking history. She has never used smokeless tobacco. She reports current alcohol use of about 4.0 - 5.0 standard drinks of alcohol per week. She reports current drug use. Drug: Marijuana.  Review of Systems: Constitutional: Negative for fever malaise or anorexia Cardiovascular: negative for chest pain Respiratory: negative for SOB or persistent cough Gastrointestinal: negative for abdominal pain  Objective  Vitals: BP 118/82   Pulse 84   Temp 98.4 F (36.9 C)   Ht '5\' 3"'$  (1.6 m)   Wt 249 lb 6.4 oz (113.1 kg)   SpO2 95%   BMI 44.18 kg/m  General: no acute distress , A&Ox3 Appears uncomfortable.  Able to sit comfortably.  Normal gait today.  Negative straight leg raise bilaterally.   Commons side effects, risks, benefits, and alternatives for medications and treatment plan prescribed today were discussed, and the patient expressed understanding of the given instructions. Patient is instructed to call or message via MyChart if he/she has any questions or concerns regarding our treatment plan. No barriers to understanding were identified. We discussed Red Flag symptoms and signs in detail. Patient expressed understanding regarding what to do in case of urgent or emergency type symptoms.  Medication list was reconciled, printed and provided to the patient in AVS. Patient instructions and summary information was reviewed with the patient as documented in the AVS. This note was prepared with assistance of Dragon voice recognition software. Occasional wrong-word or sound-a-like substitutions may have occurred due to the inherent limitations of voice recognition software  This visit occurred during the SARS-CoV-2 public health emergency.  Safety protocols were in place, including screening  questions prior to the visit, additional usage of staff PPE, and extensive cleaning of exam room while observing appropriate contact time as indicated for disinfecting solutions.

## 2022-08-21 NOTE — Patient Instructions (Signed)
Please return in 3 months for weight recheck.  If you have any questions or concerns, please don't hesitate to send me a message via MyChart or call the office at [phone]. Thank you for visiting with [place] today! It's our pleasure caring for you.

## 2022-08-21 NOTE — Progress Notes (Signed)
Office Visit Note   Patient: Ann Watson           Date of Birth: 1971/02/21           MRN: [id number] Visit Date: 08/21/2022              Requested by: [name], [place],  [place] [id number] PCP: [name], MD  Chief Complaint  Patient presents with   Lower Back - Pain, Follow-up      HPI: Ms. Stetzer is a pleasant 51 year old woman with a long history of troubles with her lower back which she attributed to "sciatica ".  She is usually been able to treat this successfully in the past with chiropractic care.  She did have a flareup in March but again with chiropractic care and medications was able to get this back under control.  She was seen a week ago with acute symptoms that she had not had before.  This pain that she was having was running down her left leg all the way to her ankle.  No loss of bowel or bladder control.  When she was seen she was taking a steroid Dosepak that was prescribed by her primary care provider.  Because of the chronicity of the problem and her acute symptoms an MRI was ordered .she is here to review that today.  She admits that she is feeling much better.  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  In the past this has caused some aggravation of her back pain.  Denies any bowel or bladder changes.  No pain referable to the right lower extremity  Assessment & Plan: Visit Diagnoses:  1. Radiculopathy, lumbar region     Plan: MRI was reviewed with her today.  Findings most noted were bilateral facet arthrosis of the left greater than right of L4-5 and L5-S1.  She did not have any bulging or herniated disc .no spinal canal stenosis.  She seems to have gotten some good relief from the oral steroid.  We discussed with her that certainly she could be referred for some facet injections  that might make her symptoms improve even further.  She is interested first and going and working with a physical therapist to learn a back stabilization exercises that she would like to do on her own.  We have given her referral for this.  She also is a Designer, jewellery and teaches.  We suggested if she had pain that was aggravated with this she could try a lumbar support.  May call us if she wants to be referred for facet injections  Follow-Up Instructions: No follow-ups on file.   Ortho Exam  Patient is alert, oriented, no adenopathy, well-dressed, normal affect, normal respiratory effort. Examination of her lower back.  Sensation is intact today.  She has 5 out of 5 strength with dorsiflexion plantarflexion of her ankles extension and flexion of her legs and hips.  She has no real reproduction of her symptoms with a straight leg raise either on the left or the right.  Imaging: No results found. No images are attached to the encounter.  Labs: Lab Results  Component Value Date   HGBA1C 5.7 03/15/2022     Lab Results  Component Value Date   ALBUMIN 4.1 03/15/2022   ALBUMIN 4.1 07/05/2020  ALBUMIN 3.9 09/13/2019    No results found for: "MG" No results found for: "VD25OH"  No results found for: "PREALBUMIN"    Latest Ref Rng & Units 03/15/2022   11:53 AM 06/13/2021    5:27 AM 06/08/2021   12:38 PM  CBC EXTENDED  WBC 4.0 - 10.5 K/uL 11.6  18.5  11.0   RBC 3.87 - 5.11 Mil/uL 4.88  4.56  4.65   Hemoglobin 12.0 - 15.0 g/dL 14.0  12.5  12.7   HCT 36.0 - 46.0 % 42.5  40.3  41.0   Platelets 150.0 - 400.0 K/uL 228.0  263  228   NEUT# 1.4 - 7.7 K/uL 7.8     Lymph# 0.7 - 4.0 K/uL 2.8        There is no height or weight on file to calculate BMI.  Orders:  Orders Placed This Encounter  Procedures   Ambulatory referral to Physical Therapy   No orders of the defined types were placed in this encounter.    Procedures: No procedures performed  Clinical Data: No additional  findings.  ROS:  All other systems negative, except as noted in the HPI. Review of Systems  Objective: Vital Signs: There were no vitals taken for this visit.  Specialty Comments:  No specialty comments available.  PMFS History: Patient Active Problem List   Diagnosis Date Noted   Radiculopathy, lumbar region 08/15/2022   Rosacea 03/15/2022   S/P laparoscopic hysterectomy 06/12/2021   Perimenopausal 09/13/2019   Morbid obesity ([place]) 09/13/2019   Sensorineural hearing loss (SNHL), bilateral 09/10/2018   History of kidney stones 03/09/2018   Cervical radiculopathy 12/21/2015   Constipation, chronic 11/15/2013   Internal hemorrhoids with prolapse/pain 08/07/2011   Past Medical History:  Diagnosis Date   Concussion    age 71, no residual   External hemorrhoid, thrombosed 08/07/2011   Fibroids 06/04/2021   Hemorrhoids    History of kidney stones    surgery to remove 5-6 yrs ago as of 06-04-2021 per pt   HSV infection    Wears glasses     Family History  Problem Relation Age of Onset   Diabetes Mother    Heart disease Mother    Hypertension Mother    Early death Father    Drug abuse Father    Thyroid cancer Maternal Aunt 60   Hyperlipidemia Maternal Grandmother    Hypertension Maternal Grandmother    Arthritis Maternal Grandmother    Depression Maternal Grandmother    Diabetes Maternal Grandfather    Heart disease Maternal Grandfather    Arthritis Paternal Grandfather    Birth defects Paternal Grandfather    Cancer Paternal Grandfather    Early death Paternal Grandfather    Heart disease Paternal Grandfather     Past Surgical History:  Procedure Laterality Date   DILITATION & CURRETTAGE/HYSTROSCOPY WITH HYDROTHERMAL ABLATION N/A 08/19/2018   Procedure: DILATATION & CURETTAGE/HYSTEROSCOPY WITH HYDROTHERMAL ABLATION;  Surgeon: Janyth Pupa, DO;  Location: [facility];  Service: Gynecology;  Laterality: N/A;   KIDNEY STONE SURGERY     5 to 6 yrs ago cystoscopy done  per pt as of 06-04-2021   MULTIPLE TOOTH EXTRACTIONS     poor dental last done 2 months ago as of 06-04-2021   ORIF ANKLE FRACTURE Right 01/15/2014   Procedure: OPEN REDUCTION INTERNAL FIXATION (ORIF) ANKLE FRACTURE;  Surgeon: Meredith Pel, MD;  Location: [facility];  Service: Orthopedics;  Laterality: Right;  OPEN REDUCTION INTERNAL FIXATION BI-MALLEOLAR ANKLE FRACTURE (SMITH-NEPHEW  ROBOTIC ASSISTED LAPAROSCOPIC HYSTERECTOMY AND SALPINGECTOMY N/A 06/12/2021   Procedure: XI ROBOTIC ASSISTED LAPAROSCOPIC HYSTERECTOMY AND BILATERAL SALPINGECTOMY;  Surgeon: Christophe Louis, MD;  Location: [facility];  Service: Gynecology;  Laterality: N/A;   Social History   Occupational History   Occupation: Psychologist, educational: [employer]  Tobacco Use   Smoking status: Former    Packs/day: 1.50    Years: 23.00    Total pack years: 34.50    Types: Cigarettes    Quit date: 08/10/2007    Years since quitting: 15.0   Smokeless tobacco: Never  Vaping Use   Vaping Use: Never used  Substance and Sexual Activity   Alcohol use: Yes    Alcohol/week: 4.0 - 5.0 standard drinks of alcohol    Types: 4 - 5 Glasses of wine per week    Comment: wine -occ   Drug use: Yes    Types: Marijuana    Comment: last used marijuana 3 weeks ago per pt 06-04-2021   Sexual activity: Yes    Birth control/protection: None    Comment: Husband vasectomy

## 2022-08-22 ENCOUNTER — Other Ambulatory Visit: Payer: Commercial Managed Care - PPO

## 2022-08-30 ENCOUNTER — Other Ambulatory Visit: Payer: Self-pay | Admitting: Family

## 2022-08-30 DIAGNOSIS — M5432 Sciatica, left side: Secondary | ICD-10-CM

## 2022-09-05 NOTE — Therapy (Addendum)
[facility] EVALUATION/DISCHARGE   Patient Name: Ann Watson MRN: [id number] DOB:1971-09-29, 51 y.o., female Today's Date: 09/06/2022  PHYSICAL THERAPY DISCHARGE SUMMARY  Visits from Start of Care: 1  Current functional level related to goals / functional outcomes: See evaluation   Remaining deficits: See evaluation   Education / Equipment: Starter HEP   Patient agrees to discharge. Patient goals were  established with no future visits attended . Patient is being discharged due to not returning since the last visit.     PT End of Session - 09/06/22 1146     Visit Number 1    Number of Visits 8    PT Start Time 3532    PT Stop Time 1056    PT Time Calculation (min) 41 min    Activity Tolerance Patient tolerated treatment well;No increased pain    Behavior During Therapy [facility] for tasks assessed/performed             Past Medical History:  Diagnosis Date   Concussion    age 1, no residual   External hemorrhoid, thrombosed 08/07/2011   Facet arthritis, degenerative, lumbar spine 08/21/2022   L4-5, L5-S1 by lumbar MRI 07/2022   Fibroids 06/04/2021   Hemorrhoids    History of kidney stones    surgery to remove 5-6 yrs ago as of 06-04-2021 per pt   HSV infection    Wears glasses    Past Surgical History:  Procedure Laterality Date   DILITATION & CURRETTAGE/HYSTROSCOPY WITH HYDROTHERMAL ABLATION N/A 08/19/2018   Procedure: DILATATION & CURETTAGE/HYSTEROSCOPY WITH HYDROTHERMAL ABLATION;  Surgeon: Janyth Pupa, DO;  Location: [facility];  Service: Gynecology;  Laterality: N/A;   KIDNEY STONE SURGERY     5 to 6 yrs ago cystoscopy done per pt as of 06-04-2021   MULTIPLE TOOTH EXTRACTIONS     poor dental last done 2 months ago as of 06-04-2021   ORIF ANKLE FRACTURE Right 01/15/2014   Procedure: OPEN REDUCTION INTERNAL FIXATION (ORIF) ANKLE FRACTURE;  Surgeon: Meredith Pel, MD;  Location: [facility];  Service: Orthopedics;  Laterality: Right;   OPEN REDUCTION INTERNAL FIXATION BI-MALLEOLAR ANKLE FRACTURE (SMITH-NEPHEW   ROBOTIC ASSISTED LAPAROSCOPIC HYSTERECTOMY AND SALPINGECTOMY N/A 06/12/2021   Procedure: XI ROBOTIC ASSISTED LAPAROSCOPIC HYSTERECTOMY AND BILATERAL SALPINGECTOMY;  Surgeon: Christophe Louis, MD;  Location: [facility];  Service: Gynecology;  Laterality: N/A;   Patient Active Problem List   Diagnosis Date Noted   Facet arthritis, degenerative, lumbar spine 08/21/2022   Radiculopathy, lumbar region 08/15/2022   Rosacea 03/15/2022   S/P laparoscopic hysterectomy 06/12/2021   Perimenopausal 09/13/2019   Morbid obesity ([place]) 09/13/2019   Sensorineural hearing loss (SNHL), bilateral 09/10/2018   History of kidney stones 03/09/2018   Cervical radiculopathy 12/21/2015   Constipation, chronic 11/15/2013   Internal hemorrhoids with prolapse/pain 08/07/2011    PCP: [name], MD  REFERRING PROVIDER: Bevely Palmer Persons, PA  REFERRING DIAG: M54.16 (ICD-10-CM) - Radiculopathy, lumbar region   Rationale for Evaluation and Treatment Rehabilitation  THERAPY DIAG:  Other low back pain - Plan: PT plan of care cert/re-cert  Radiculopathy, lumbar region - Plan: PT plan of care cert/re-cert  Abnormal posture - Plan: PT plan of care cert/re-cert  Muscle weakness (generalized) - Plan: PT plan of care cert/re-cert  ONSET DATE: 4 weeks ago   SUBJECTIVE:  SUBJECTIVE STATEMENT: Dave had an acute sciatic episode after a visit to her chiropractor.  She initially had low back pain and Lt LE symptoms to the foot.  Symptoms are currently low back and Lt LE to the calf.  She is making some positive changes to her diet and wellness and wants some professional help to prescribe appropriate exercises and help her avoid future  re-injury.  PERTINENT HISTORY:  Previous kidney stones, ORIF R ankle 2015, obesity  PAIN:  Are you having pain? Yes: NPRS scale: 2-5/10 low back and Lt leg 2-5/10 on a 10/10 Pain location: Low back and Lt leg to the calf, previously had some tingling in the toes Pain description: Ache, sore Aggravating factors: Poor diet Relieving factors: The pool, stretching, avoiding prolonged sitting   PRECAUTIONS: Back  WEIGHT BEARING RESTRICTIONS No  FALLS:  Has patient fallen in last 6 months? No  LIVING ENVIRONMENT: Lives with: lives with their family Lives in: House/apartment Stairs:  No problems Has following equipment at home: None  OCCUPATION: Sits at a computer 8 hours/day  PLOF: Independent  PATIENT GOALS Get back into wood turning, lifting logs, and return to working out   OBJECTIVE:   DIAGNOSTIC FINDINGS:  IMPRESSION: 1. Moderate L4-5 and L5-S1 facet arthrosis, which may serve as a source of local low back pain. 2. No spinal canal or neural foraminal stenosis.  PATIENT SURVEYS:  FOTO 48 (Goal 66)  SCREENING FOR RED FLAGS: Bowel or bladder incontinence: No Spinal tumors: No Cauda equina syndrome: No Compression fracture: No  COGNITION:  Overall cognitive status: Within functional limits for tasks assessed     SENSATION: Not now, did have down to the toes 2 weeks ago  MUSCLE LENGTH: Hamstrings: Right 35 deg; Left 30 deg  POSTURE: rounded shoulders, forward head, and decreased lumbar lordosis  LUMBAR ROM:   Active  A/PROM  eval  Flexion   Extension 5  Right lateral flexion   Left lateral flexion   Right rotation   Left rotation    (Blank rows = not tested)  LOWER EXTREMITY ROM:     Passive  Right eval Left eval  Hip flexion 80 80  Hip extension    Hip abduction    Hip adduction    Hip internal rotation 10 15  Hip external rotation 22 17  Knee flexion    Knee extension    Ankle dorsiflexion    Ankle plantarflexion    Ankle inversion     Ankle eversion     (Blank rows = not tested)  LOWER EXTREMITY STRENGTH:    MMT Right eval Left eval  Hip flexion    Hip extension    Hip abduction    Hip adduction    Hip internal rotation    Hip external rotation    Knee flexion    Knee extension    Ankle dorsiflexion    Ankle plantarflexion    Ankle inversion    Ankle eversion     (Blank rows = not tested)   TODAY'S TREATMENT  09/06/2022 Lumbar extension AROM 10X 3 seconds Shoulder blade pinches 10X 5 seconds Single knee to chest (other leg straight) 3X 20 seconds (4-5 next visit) Supine hamstrings stretch (other leg straight) 3X 20 seconds (4-5 next visit)  Functional Activities: review spine anatomy, basic posture and bed mobility education (log roll), recommendations to walk 3X/week for 20 minutes and take breaks to stand and move at work every hour (sits at a computer 8 hours/day), review  of imaging   PATIENT EDUCATION:  Education details: See above Person educated: Patient Education method: Explanation, Demonstration, Tactile cues, Verbal cues, and Handouts Education comprehension: verbalized understanding, returned demonstration, verbal cues required, tactile cues required, and needs further education   HOME EXERCISE PROGRAM: Access Code: [email or web address] URL: [email or web address] Date: 09/06/2022 Prepared by: Vista Mink  Exercises - Standing Lumbar Extension at [place] 5 x daily - 7 x weekly - 1 sets - 5 reps - 3 seconds hold - Standing Scapular Retraction  - 5 x daily - 7 x weekly - 1 sets - 5 reps - 5 second hold - Single Knee to Chest Stretch  - 2 x daily - 7 x weekly - 1 sets - 5 reps - 20 seconds hold - Supine Hamstring Stretch  - 2 x daily - 7 x weekly - 1 sets - 5 reps - 20 seconds hold  ASSESSMENT:  CLINICAL IMPRESSION: Patient is a 51 y.o. female who was seen today for physical therapy evaluation and treatment for low back pain with radiculopathy.  Carly is improved  since injury but is still having low back and left leg pain to the calf as high as 5/10 on the VAS.  She will benefit from an education and exercise focused program to meet LTGs and prepare her for long-term independent rehabilitation.    OBJECTIVE IMPAIRMENTS decreased activity tolerance, decreased endurance, decreased knowledge of condition, decreased ROM, decreased strength, decreased safety awareness, impaired perceived functional ability, increased muscle spasms, impaired flexibility, improper body mechanics, postural dysfunction, obesity, and pain.   ACTIVITY LIMITATIONS lifting, bending, sitting, and bed mobility  PARTICIPATION LIMITATIONS: community activity, occupation, and wood working  [place] Previous kidney stones, ORIF R ankle 2015, obesity are also affecting patient's functional outcome.   REHAB POTENTIAL: Good  CLINICAL DECISION MAKING: Stable/uncomplicated  EVALUATION COMPLEXITY: Low   GOALS: Goals reviewed with patient? Yes  SHORT TERM GOALS: Target date: 10/04/2022  Tesha will improve lumbar extension to at least 10 degrees Baseline: 5 degrees Goal status: INITIAL  2.  Makenly will be able to demonstrate correct bed mobility, sitting, standing and lifting posture/body mechanics Baseline: Unable at 09/06/2022 evaluation Goal status: INITIAL    LONG TERM GOALS: Target date: 11/01/2022  Improve Bil LE flexibility for hip flexors to at least 90 degrees; hamstrings to at least 45 degrees; and ER to 40 degrees Baseline: 80/80; 30/35; 17/22 Goal status: INITIAL  2.  Improve low back strength as assessed by FOTO and objective tests Baseline: Deferred 09/06/2022 due to discomfort with test positions, see FOTO Goal status: INITIAL  3.  Improve FOTO to 66 in 10 visits Baseline: 48 Goal status: INITIAL  4.  Lekeshia will report low back pain consistently 0-3/10 on the VAS with no sciatic complaints. Baseline: Low back and Lt leg symptoms 2-5/10 on the  VAS Goal status: INITIAL  5.  Bernadetta will be independent with her long-term HEP at [place] Baseline: Started 09/06/2022 Goal status: INITIAL  PLAN: PT FREQUENCY: 1x/week  PT DURATION: 6 weeks  PLANNED INTERVENTIONS: Therapeutic exercises, Therapeutic activity, Gait training, Patient/Family education, Self Care, Cryotherapy, Traction, and Manual therapy.  PLAN FOR NEXT SESSION: Review HEP, practical body mechanics for her wood working and ADLs.  Progress low back strength and consider figure 4 stretch for limited hip ER AROM.   Farley Ly, PT, MPT 09/06/2022, 12:08 PM

## 2022-09-06 ENCOUNTER — Ambulatory Visit (INDEPENDENT_AMBULATORY_CARE_PROVIDER_SITE_OTHER): Payer: Commercial Managed Care - PPO | Admitting: Rehabilitative and Restorative Service Providers"

## 2022-09-06 ENCOUNTER — Encounter: Payer: Self-pay | Admitting: Rehabilitative and Restorative Service Providers"

## 2022-09-06 DIAGNOSIS — R293 Abnormal posture: Secondary | ICD-10-CM

## 2022-09-06 DIAGNOSIS — M5416 Radiculopathy, lumbar region: Secondary | ICD-10-CM

## 2022-09-06 DIAGNOSIS — M6281 Muscle weakness (generalized): Secondary | ICD-10-CM

## 2022-09-06 DIAGNOSIS — M5459 Other low back pain: Secondary | ICD-10-CM

## 2022-09-06 LAB — HM MAMMOGRAPHY

## 2022-09-16 ENCOUNTER — Encounter: Payer: Self-pay | Admitting: *Deleted

## 2022-09-20 ENCOUNTER — Encounter: Payer: Commercial Managed Care - PPO | Admitting: Rehabilitative and Restorative Service Providers"

## 2022-09-27 ENCOUNTER — Encounter: Payer: Commercial Managed Care - PPO | Admitting: Rehabilitative and Restorative Service Providers"

## 2022-09-30 ENCOUNTER — Encounter: Payer: Self-pay | Admitting: Family Medicine

## 2022-10-03 ENCOUNTER — Telehealth: Payer: Self-pay | Admitting: Rehabilitative and Restorative Service Providers"

## 2022-10-03 NOTE — Telephone Encounter (Signed)
Called pt to r/s. She did not answer. LVM

## 2022-10-04 ENCOUNTER — Encounter: Payer: Commercial Managed Care - PPO | Admitting: Rehabilitative and Restorative Service Providers"

## 2022-10-11 ENCOUNTER — Encounter: Payer: Commercial Managed Care - PPO | Admitting: Rehabilitative and Restorative Service Providers"

## 2022-12-05 ENCOUNTER — Encounter: Payer: Self-pay | Admitting: *Deleted

## 2023-04-07 ENCOUNTER — Emergency Department (HOSPITAL_COMMUNITY)
Admission: EM | Admit: 2023-04-07 | Discharge: 2023-04-07 | Disposition: A | Discharge status: Home / Self Care | Payer: Commercial Managed Care - PPO | Attending: Student | Admitting: Student

## 2023-04-07 ENCOUNTER — Emergency Department (HOSPITAL_COMMUNITY): Payer: Commercial Managed Care - PPO

## 2023-04-07 ENCOUNTER — Encounter (HOSPITAL_COMMUNITY): Payer: Self-pay

## 2023-04-07 DIAGNOSIS — Z87891 Personal history of nicotine dependence: Secondary | ICD-10-CM | POA: Diagnosis not present

## 2023-04-07 DIAGNOSIS — D72829 Elevated white blood cell count, unspecified: Secondary | ICD-10-CM | POA: Diagnosis not present

## 2023-04-07 DIAGNOSIS — N132 Hydronephrosis with renal and ureteral calculous obstruction: Secondary | ICD-10-CM | POA: Insufficient documentation

## 2023-04-07 DIAGNOSIS — N200 Calculus of kidney: Secondary | ICD-10-CM

## 2023-04-07 DIAGNOSIS — R109 Unspecified abdominal pain: Secondary | ICD-10-CM | POA: Diagnosis present

## 2023-04-07 LAB — CBC WITH DIFFERENTIAL/PLATELET
Abs Immature Granulocytes: 0.04 10*3/uL (ref 0.00–0.07)
Basophils Absolute: 0 10*3/uL (ref 0.0–0.1)
Basophils Relative: 0 %
Eosinophils Absolute: 0.3 10*3/uL (ref 0.0–0.5)
Eosinophils Relative: 2 %
HCT: 41.8 % (ref 36.0–46.0)
Hemoglobin: 13.6 g/dL (ref 12.0–15.0)
Immature Granulocytes: 0 %
Lymphocytes Relative: 19 %
Lymphs Abs: 2.6 10*3/uL (ref 0.7–4.0)
MCH: 28.5 pg (ref 26.0–34.0)
MCHC: 32.5 g/dL (ref 30.0–36.0)
MCV: 87.6 fL (ref 80.0–100.0)
Monocytes Absolute: 0.9 10*3/uL (ref 0.1–1.0)
Monocytes Relative: 7 %
Neutro Abs: 9.5 10*3/uL — ABNORMAL HIGH (ref 1.7–7.7)
Neutrophils Relative %: 72 %
Platelets: 290 10*3/uL (ref 150–400)
RBC: 4.77 MIL/uL (ref 3.87–5.11)
RDW: 13.5 % (ref 11.5–15.5)
WBC: 13.2 10*3/uL — ABNORMAL HIGH (ref 4.0–10.5)
nRBC: 0 % (ref 0.0–0.2)

## 2023-04-07 LAB — URINALYSIS, ROUTINE W REFLEX MICROSCOPIC
Bilirubin Urine: NEGATIVE
Glucose, UA: NEGATIVE mg/dL
Ketones, ur: NEGATIVE mg/dL
Leukocytes,Ua: NEGATIVE
Nitrite: NEGATIVE
Protein, ur: NEGATIVE mg/dL
RBC / HPF: >50 RBC/hpf (ref 0–5)
Specific Gravity, Urine: 1.017 (ref 1.005–1.030)
pH: 5 (ref 5.0–8.0)

## 2023-04-07 LAB — COMPREHENSIVE METABOLIC PANEL
ALT: 31 U/L (ref 0–44)
AST: 20 U/L (ref 15–41)
Albumin: 3.9 g/dL (ref 3.5–5.0)
Alkaline Phosphatase: 70 U/L (ref 38–126)
Anion gap: 8 (ref 5–15)
BUN: 20 mg/dL (ref 6–20)
CO2: 25 mmol/L (ref 22–32)
Calcium: 9.2 mg/dL (ref 8.9–10.3)
Chloride: 107 mmol/L (ref 98–111)
Creatinine, Ser: 0.9 mg/dL (ref 0.44–1.00)
GFR, Estimated: >60 mL/min (ref >60)
Glucose, Bld: 114 mg/dL — ABNORMAL HIGH (ref 70–99)
Potassium: 4 mmol/L (ref 3.5–5.1)
Sodium: 140 mmol/L (ref 135–145)
Total Bilirubin: 0.4 mg/dL (ref 0.3–1.2)
Total Protein: 7.3 g/dL (ref 6.5–8.1)

## 2023-04-07 MED ORDER — NAPROXEN 375 MG PO TABS: 375.0000 mg | ORAL_TABLET | Freq: Two times a day (BID) | ORAL | 0 refills | Status: DC

## 2023-04-07 MED ORDER — TAMSULOSIN HCL 0.4 MG PO CAPS: 0.4000 mg | ORAL_CAPSULE | Freq: Every day | ORAL | 0 refills | Status: DC

## 2023-04-07 MED ORDER — LACTATED RINGERS IV BOLUS
1000.0000 mL | Freq: Once | INTRAVENOUS | Status: AC
  Administered 2023-04-07: 1000 mL via INTRAVENOUS

## 2023-04-07 MED ORDER — KETOROLAC TROMETHAMINE 15 MG/ML IJ SOLN
15.0000 mg | Freq: Once | INTRAMUSCULAR | Status: AC
  Administered 2023-04-07: 15 mg via INTRAVENOUS
  Filled 2023-04-07: qty 1

## 2023-04-07 MED ORDER — TAMSULOSIN HCL 0.4 MG PO CAPS
0.4000 mg | ORAL_CAPSULE | Freq: Once | ORAL | Status: AC
  Administered 2023-04-07: 0.4 mg via ORAL
  Filled 2023-04-07: qty 1

## 2023-04-07 MED ORDER — ONDANSETRON 4 MG PO TBDP
4.0000 mg | ORAL_TABLET | Freq: Once | ORAL | Status: AC
  Administered 2023-04-07: 4 mg via ORAL
  Filled 2023-04-07: qty 1

## 2023-04-07 NOTE — ED Triage Notes (Signed)
Pt states that she has been having R flank pain all day, with dysuria and has a hx of stones, no hematuria. Some nausea.

## 2023-04-07 NOTE — ED Provider Notes (Signed)
[place] [facility]ERGENC[facility] DEPARTMENT AT [facility] Provider Note  CSN: [id number] Arrival date & time: 04/07/23 1913  Chief Complaint(s) Flank Pain  HPI Ann Watson is a 52 y.o. female with PMH nephrolithiasis who presents emergency department for evaluation of right flank pain.  She states that she has had symptoms for the last 2 weeks and thought that she had already passed a kidney stone as her symptoms seem to have improved but returned significantly this morning with associated urinary urgency and frequency.  Denies fever, chills, chest pain, shortness of breath, abdominal pain, nausea, vomiting or other systemic symptoms.   Past Medical History Past Medical History:  Diagnosis Date   Concussion    age 41, no residual   External hemorrhoid, thrombosed 08/07/2011   Facet arthritis, degenerative, lumbar spine 08/21/2022   L4-5, L5-S1 by lumbar MRI 07/2022   Fibroids 06/04/2021   Hemorrhoids    History of kidney stones    surgery to remove 5-6 yrs ago as of 06-04-2021 per pt   HSV infection    Wears glasses    Patient Active Problem List   Diagnosis Date Noted   Facet arthritis, degenerative, lumbar spine 08/21/2022   Radiculopathy, lumbar region 08/15/2022   Rosacea 03/15/2022   S/P laparoscopic hysterectomy 06/12/2021   Perimenopausal 09/13/2019   Morbid obesity 09/13/2019   Sensorineural hearing loss (SNHL), bilateral 09/10/2018   History of kidney stones 03/09/2018   Cervical radiculopathy 12/21/2015   Constipation, chronic 11/15/2013   Internal hemorrhoids with prolapse/pain 08/07/2011   Home Medication(s) Prior to Admission medications   Medication Sig Start Date End Date Taking? Authorizing Provider  naproxen (NAPROSYN) 375 MG tablet Take 1 tablet (375 mg total) by mouth 2 (two) times daily. 04/07/23  Yes Brailynn Breth, MD  tamsulosin (FLOMAX) 0.4 MG CAPS capsule Take 1 capsule (0.4 mg total) by mouth daily. 04/07/23  Yes Jsiah Menta, MD   cyclobenzaprine (FLEXERIL) 10 MG tablet TAKE 1 TABLET BY MOUTH THREE TIMES A DAY AS NEEDED FOR MUSCLE SPASMS 09/02/22   Willow Ora, MD  tirzepatide [facility]) 2.5 MG/0.5ML Pen Inject 2.5 mg into the skin once a week. 08/21/22   Willow Ora, MD  tirzepatide [facility]) 5 MG/0.5ML Pen Inject 5 mg into the skin once a week. 08/21/22   Willow Ora, MD                                                                                                                                    Past Surgical History Past Surgical History:  Procedure Laterality Date   DILITATION & CURRETTAGE/HYSTROSCOPY WITH HYDROTHERMAL ABLATION N/A 08/19/2018   Procedure: DILATATION & CURETTAGE/HYSTEROSCOPY WITH HYDROTHERMAL ABLATION;  Surgeon: Myna Hidalgo, DO;  Location: [facility];  Service: Gynecology;  Laterality: N/A;   KIDNEY STONE SURGERY     5 to 6 yrs ago cystoscopy done per pt as of 06-04-2021   MULTIPLE TOOTH EXTRACTIONS  poor dental last done 2 months ago as of 06-04-2021   ORIF ANKLE FRACTURE Right 01/15/2014   Procedure: OPEN REDUCTION INTERNAL FIXATION (ORIF) ANKLE FRACTURE;  Surgeon: Cammy Copa, MD;  Location: [facility];  Service: Orthopedics;  Laterality: Right;  OPEN REDUCTION INTERNAL FIXATION BI-MALLEOLAR ANKLE FRACTURE (SMITH-NEPHEW   ROBOTIC ASSISTED LAPAROSCOPIC HYSTERECTOMY AND SALPINGECTOMY N/A 06/12/2021   Procedure: XI ROBOTIC ASSISTED LAPAROSCOPIC HYSTERECTOMY AND BILATERAL SALPINGECTOMY;  Surgeon: Gerald Leitz, MD;  Location: [facility];  Service: Gynecology;  Laterality: N/A;   Family History Family History  Problem Relation Age of Onset   Diabetes Mother    Heart disease Mother    Hypertension Mother    Early death Father    Drug abuse Father    Thyroid cancer Maternal Aunt 16   Hyperlipidemia Maternal Grandmother    Hypertension Maternal Grandmother    Arthritis Maternal Grandmother    Depression Maternal Grandmother    Diabetes Maternal Grandfather    Heart  disease Maternal Grandfather    Arthritis Paternal Grandfather    Birth defects Paternal Grandfather    Cancer Paternal Grandfather    Early death Paternal Grandfather    Heart disease Paternal Grandfather     Social History Social History   Tobacco Use   Smoking status: Former    Packs/day: 1.50    Years: 23.00    Additional pack years: 0.00    Total pack years: 34.50    Types: Cigarettes    Quit date: 08/10/2007    Years since quitting: 15.6   Smokeless tobacco: Never  Vaping Use   Vaping Use: Never used  Substance Use Topics   Alcohol use: Yes    Alcohol/week: 4.0 - 5.0 standard drinks of alcohol    Types: 4 - 5 Glasses of wine per week    Comment: wine -occ   Drug use: Yes    Types: Marijuana    Comment: last used marijuana 3 weeks ago per pt 06-04-2021   Allergies Bee venom, Kiwi extract, and Mango flavor  Review of Systems Review of Systems  Genitourinary:  Positive for flank pain.    Physical Exam Vital Signs  I have reviewed the triage vital signs BP (!) 141/96 (BP Location: Right Arm)   Pulse 62   Temp 98.4 F (36.9 C) (Oral)   Resp 20   SpO2 97%   Physical Exam Vitals and nursing note reviewed.  Constitutional:      General: She is not in acute distress.    Appearance: She is well-developed.  HENT:     Head: Normocephalic and atraumatic.  Eyes:     Conjunctiva/sclera: Conjunctivae normal.  Cardiovascular:     Rate and Rhythm: Normal rate and regular rhythm.     Heart sounds: No murmur heard. Pulmonary:     Effort: Pulmonary effort is normal. No respiratory distress.     Breath sounds: Normal breath sounds.  Abdominal:     Palpations: Abdomen is soft.     Tenderness: There is no abdominal tenderness. There is right CVA tenderness.  Musculoskeletal:        General: No swelling.     Cervical back: Neck supple.  Skin:    General: Skin is warm and dry.     Capillary Refill: Capillary refill takes less than 2 seconds.  Neurological:      Mental Status: She is alert.  Psychiatric:        Mood and Affect: Mood normal.  ED Results and Treatments Labs (all labs ordered are listed, but only abnormal results are displayed) Labs Reviewed  COMPREHENSIVE METABOLIC PANEL - Abnormal; Notable for the following components:      Result Value   Glucose, Bld 114 (*)    All other components within normal limits  CBC WITH DIFFERENTIAL/PLATELET - Abnormal; Notable for the following components:   WBC 13.2 (*)    Neutro Abs 9.5 (*)    All other components within normal limits  URINALYSIS, ROUTINE W REFLEX MICROSCOPIC - Abnormal; Notable for the following components:   Hgb urine dipstick MODERATE (*)    Bacteria, UA RARE (*)    All other components within normal limits                                                                                                                          Radiology CT Renal Stone Study  Result Date: 04/07/2023 CLINICAL DATA:  Right flank pain all day with dysuria. History of stones. EXAM: CT ABDOMEN AND PELVIS WITHOUT CONTRAST TECHNIQUE: Multidetector CT imaging of the abdomen and pelvis was performed following the standard protocol without IV contrast. RADIATION DOSE REDUCTION: This exam was performed according to the departmental dose-optimization program which includes automated exposure control, adjustment of the mA and/or kV according to patient size and/or use of iterative reconstruction technique. COMPARISON:  Abdominal ultrasound 07/06/2020 and CT abdomen and pelvis 06/10/2007 FINDINGS: Lower chest: No acute abnormality. Hepatobiliary: Low-density benign cyst in the hepatic dome. No suspicious focal lesion. Unremarkable gallbladder and biliary tree. Pancreas: Unremarkable. Spleen: Unremarkable. Adrenals/Urinary Tract: Normal adrenal glands. 2-3 mm stone at the right UVJ with mild right hydroureteronephrosis. No obstructing stones on the left or left hydronephrosis. Nondistended bladder. Stomach/Bowel:  Colonic diverticulosis without diverticulitis. Normal caliber large and small bowel. Normal appendix. Stomach is within normal limits. Vascular/Lymphatic: No significant vascular findings are present. No enlarged abdominal or pelvic lymph nodes. Reproductive: Hysterectomy.  No adnexal mass. Other: No free intraperitoneal fluid or air. Musculoskeletal: No acute fracture. IMPRESSION: 2-3 mm stone at the right UVJ with mild right hydroureteronephrosis. Electronically Signed   By: Minerva Fester M.D.   On: 04/07/2023 20:00    Pertinent labs & imaging results that were available during my care of the patient were reviewed by me and considered in my medical decision making (see MDM for details).  Medications Ordered in ED Medications  ondansetron (ZOFRAN-ODT) disintegrating tablet 4 mg (4 mg Oral Given 04/07/23 2033)  lactated ringers bolus 1,000 mL (0 mLs Intravenous Stopped 04/07/23 2142)  ketorolac (TORADOL) 15 MG/ML injection 15 mg (15 mg Intravenous Given 04/07/23 2034)  tamsulosin (FLOMAX) capsule 0.4 mg (0.4 mg Oral Given 04/07/23 2209)  Procedures Procedures  (including critical care time)  Medical Decision Making / ED Course   This patient presents to the ED for concern of flank pain, this involves an extensive number of treatment options, and is a complaint that carries with it a high risk of complications and morbidity.  The differential diagnosis includes nephrolithiasis, pyelonephritis, musculoskeletal strain, AAA  MDM: Patient seen emergency room for evaluation of flank pain.  Physical exam with tenderness at the right CVA and right lower quadrant.  Laboratory evaluation reassuring with normal creatinine, mild leukocytosis to 13.2 likely stress demargination in the setting of pain, urinalysis without evidence of overt infection.  CT stone study showing a 2 to  3 mm stone at the right UVJ with mild right hydroureteronephrosis.  Pain control successfully achieved with Toradol only.  Fluid resuscitation and Flomax given and patient will follow-up outpatient with urology.  At this time patient does not meet inpatient criteria for admission on reevaluation her symptoms have significantly improved.  Patient then discharged on Naprosyn Flomax   Additional history obtained:  -External records from outside source obtained and reviewed including: Chart review including previous notes, labs, imaging, consultation notes   Lab Tests: -I ordered, reviewed, and interpreted labs.   The pertinent results include:   Labs Reviewed  COMPREHENSIVE METABOLIC PANEL - Abnormal; Notable for the following components:      Result Value   Glucose, Bld 114 (*)    All other components within normal limits  CBC WITH DIFFERENTIAL/PLATELET - Abnormal; Notable for the following components:   WBC 13.2 (*)    Neutro Abs 9.5 (*)    All other components within normal limits  URINALYSIS, ROUTINE W REFLEX MICROSCOPIC - Abnormal; Notable for the following components:   Hgb urine dipstick MODERATE (*)    Bacteria, UA RARE (*)    All other components within normal limits       Imaging Studies ordered: I ordered imaging studies including CT stone study I independently visualized and interpreted imaging. I agree with the radiologist interpretation   Medicines ordered and prescription drug management: Meds ordered this encounter  Medications   ondansetron (ZOFRAN-ODT) disintegrating tablet 4 mg   lactated ringers bolus 1,000 mL   ketorolac (TORADOL) 15 MG/ML injection 15 mg   tamsulosin (FLOMAX) capsule 0.4 mg   tamsulosin (FLOMAX) 0.4 MG CAPS capsule    Sig: Take 1 capsule (0.4 mg total) by mouth daily.    Dispense:  10 capsule    Refill:  0   naproxen (NAPROSYN) 375 MG tablet    Sig: Take 1 tablet (375 mg total) by mouth 2 (two) times daily.    Dispense:  20 tablet     Refill:  0    -I have reviewed the patients home medicines and have made adjustments as needed  Critical interventions none   Cardiac Monitoring: The patient was maintained on a cardiac monitor.  I personally viewed and interpreted the cardiac monitored which showed an underlying rhythm of: NSR  Social Determinants of Health:  Factors impacting patients care include: none   Reevaluation: After the interventions noted above, I reevaluated the patient and found that they have :improved  Co morbidities that complicate the patient evaluation  Past Medical History:  Diagnosis Date   Concussion    age 51, no residual   External hemorrhoid, thrombosed 08/07/2011   Facet arthritis, degenerative, lumbar spine 08/21/2022   L4-5, L5-S1 by lumbar MRI 07/2022   Fibroids 06/04/2021   Hemorrhoids  History of kidney stones    surgery to remove 5-6 yrs ago as of 06-04-2021 per pt   HSV infection    Wears glasses       Dispostion: I considered admission for this patient, but at this time she does not meet inpatient criteria for admission she is safe for discharge with outpatient follow-up     Final Clinical Impression(s) / ED Diagnoses Final diagnoses:  Nephrolithiasis     @PCDICTATION @    Glendora Score, MD 04/08/23 0106

## 2023-04-07 NOTE — ED Provider Triage Note (Signed)
Emergency Medicine Provider Triage Evaluation Note  Abrigail Corman , a 52 y.o. female  was evaluated in triage.  Pt complains of right-sided flank pain.  She reports this pain began today and has been having some associated dysuria and increased urinary frequency.  She does have a history of prior kidney stones requiring all levels of intervention.  Endorses some mild nausea but no vomiting at this time.  Denies any obvious hematuria in her blood.  Review of Systems  Positive: As above Negative: As above  Physical Exam  BP (!) 153/100   Pulse 76   Temp 98.8 F (37.1 C)   Resp 20   SpO2 97%  Gen:   Awake, no distress   Resp:  Normal effort  MSK:   Moves extremities without difficulty  Other:  Right CVA tenderness  Medical Decision Making  Medically screening exam initiated at 7:33 PM.  Appropriate orders placed.  Mlissa Ochocki was informed that the remainder of the evaluation will be completed by another provider, this initial triage assessment does not replace that evaluation, and the importance of remaining in the ED until their evaluation is complete.     Smitty Knudsen, PA-C 04/07/23 1938

## 2023-04-08 ENCOUNTER — Other Ambulatory Visit: Payer: Self-pay | Admitting: Radiology

## 2023-04-08 DIAGNOSIS — N6452 Nipple discharge: Secondary | ICD-10-CM

## 2023-04-29 ENCOUNTER — Encounter: Payer: Self-pay | Admitting: Radiology

## 2023-05-02 ENCOUNTER — Ambulatory Visit
Admission: RE | Admit: 2023-05-02 | Discharge: 2023-05-02 | Disposition: A | Discharge status: Home / Self Care | Payer: Commercial Managed Care - PPO | Source: Ambulatory Visit | Attending: Radiology | Admitting: Radiology

## 2023-05-02 DIAGNOSIS — N6452 Nipple discharge: Secondary | ICD-10-CM

## 2023-05-02 MED ORDER — GADOPICLENOL 0.5 MMOL/ML IV SOLN
10.0000 mL | Freq: Once | INTRAVENOUS | Status: AC | PRN
Start: 2023-05-02 — End: 2023-05-02
  Administered 2023-05-02: 10 mL via INTRAVENOUS

## 2023-05-07 ENCOUNTER — Encounter: Payer: Self-pay | Admitting: Radiology

## 2023-05-08 ENCOUNTER — Other Ambulatory Visit: Payer: Self-pay | Admitting: Radiology

## 2023-05-08 DIAGNOSIS — N6452 Nipple discharge: Secondary | ICD-10-CM

## 2023-05-09 ENCOUNTER — Ambulatory Visit
Admission: RE | Admit: 2023-05-09 | Discharge: 2023-05-09 | Disposition: A | Discharge status: Home / Self Care | Payer: Commercial Managed Care - PPO | Source: Ambulatory Visit | Attending: Radiology | Admitting: Radiology

## 2023-05-09 DIAGNOSIS — N6452 Nipple discharge: Secondary | ICD-10-CM

## 2023-05-09 HISTORY — PX: BREAST BIOPSY: SHX20

## 2023-05-09 MED ORDER — GADOPICLENOL 0.5 MMOL/ML IV SOLN
10.0000 mL | Freq: Once | INTRAVENOUS | Status: AC | PRN
  Administered 2023-05-09: 10 mL via INTRAVENOUS

## 2023-05-12 ENCOUNTER — Other Ambulatory Visit: Payer: Self-pay

## 2023-05-12 ENCOUNTER — Telehealth: Payer: Self-pay

## 2023-05-12 DIAGNOSIS — N6310 Unspecified lump in the right breast, unspecified quadrant: Secondary | ICD-10-CM

## 2023-05-12 NOTE — Telephone Encounter (Signed)
Spoke with Lowella Bandy from the [facility] of [place] and she stated that the pt is needing a new referral for surgery for the results they found. Please take a look at her results and she will need also a F/U MRI to recheck clip placement post biopsy.  Please Advise

## 2023-07-21 ENCOUNTER — Other Ambulatory Visit (HOSPITAL_BASED_OUTPATIENT_CLINIC_OR_DEPARTMENT_OTHER): Payer: Self-pay

## 2023-07-21 ENCOUNTER — Encounter: Payer: Self-pay | Admitting: Family Medicine

## 2023-07-21 ENCOUNTER — Ambulatory Visit: Payer: Commercial Managed Care - PPO | Admitting: Family Medicine

## 2023-07-21 ENCOUNTER — Encounter (HOSPITAL_BASED_OUTPATIENT_CLINIC_OR_DEPARTMENT_OTHER): Payer: Self-pay

## 2023-07-21 VITALS — BP 136/98 | HR 65 | Temp 98.3°F | Ht 63.0 in | Wt 256.0 lb

## 2023-07-21 DIAGNOSIS — R030 Elevated blood-pressure reading, without diagnosis of hypertension: Secondary | ICD-10-CM | POA: Diagnosis not present

## 2023-07-21 DIAGNOSIS — D241 Benign neoplasm of right breast: Secondary | ICD-10-CM | POA: Diagnosis not present

## 2023-07-21 DIAGNOSIS — N951 Menopausal and female climacteric states: Secondary | ICD-10-CM | POA: Diagnosis not present

## 2023-07-21 DIAGNOSIS — Z23 Encounter for immunization: Secondary | ICD-10-CM | POA: Diagnosis not present

## 2023-07-21 DIAGNOSIS — Z79890 Hormone replacement therapy (postmenopausal): Secondary | ICD-10-CM

## 2023-07-21 DIAGNOSIS — Z0000 Encounter for general adult medical examination without abnormal findings: Secondary | ICD-10-CM

## 2023-07-21 DIAGNOSIS — M67471 Ganglion, right ankle and foot: Secondary | ICD-10-CM

## 2023-07-21 LAB — CBC WITH DIFFERENTIAL/PLATELET
Basophils Absolute: 0.1 10*3/uL (ref 0.0–0.1)
Basophils Relative: 0.8 % (ref 0.0–3.0)
Eosinophils Absolute: 0.3 10*3/uL (ref 0.0–0.7)
Eosinophils Relative: 2.8 % (ref 0.0–5.0)
HCT: 41.7 % (ref 36.0–46.0)
Hemoglobin: 13.7 g/dL (ref 12.0–15.0)
Lymphocytes Relative: 24 % (ref 12.0–46.0)
Lymphs Abs: 2.4 10*3/uL (ref 0.7–4.0)
MCHC: 33 g/dL (ref 30.0–36.0)
MCV: 86.5 fl (ref 78.0–100.0)
Monocytes Absolute: 0.6 10*3/uL (ref 0.1–1.0)
Monocytes Relative: 6.4 % (ref 3.0–12.0)
Neutro Abs: 6.5 10*3/uL (ref 1.4–7.7)
Neutrophils Relative %: 66 % (ref 43.0–77.0)
Platelets: 247 10*3/uL (ref 150.0–400.0)
RBC: 4.81 Mil/uL (ref 3.87–5.11)
RDW: 14.2 % (ref 11.5–15.5)
WBC: 9.8 10*3/uL (ref 4.0–10.5)

## 2023-07-21 LAB — HEMOGLOBIN A1C: Hgb A1c MFr Bld: 5.6 % (ref 4.6–6.5)

## 2023-07-21 LAB — LIPID PANEL
Cholesterol: 174 mg/dL (ref 0–200)
HDL: 53.3 mg/dL (ref >39.00)
LDL Cholesterol: 106 mg/dL — ABNORMAL HIGH (ref 0–99)
NonHDL: 120.89
Total CHOL/HDL Ratio: 3
Triglycerides: 75 mg/dL (ref 0.0–149.0)
VLDL: 15 mg/dL (ref 0.0–40.0)

## 2023-07-21 LAB — COMPREHENSIVE METABOLIC PANEL
ALT: 30 U/L (ref 0–35)
AST: 20 U/L (ref 0–37)
Albumin: 4.2 g/dL (ref 3.5–5.2)
Alkaline Phosphatase: 90 U/L (ref 39–117)
BUN: 13 mg/dL (ref 6–23)
CO2: 27 mEq/L (ref 19–32)
Calcium: 9.6 mg/dL (ref 8.4–10.5)
Chloride: 99 mEq/L (ref 96–112)
Creatinine, Ser: 0.9 mg/dL (ref 0.40–1.20)
GFR: 72.64 mL/min (ref >60.00)
Glucose, Bld: 101 mg/dL — ABNORMAL HIGH (ref 70–99)
Potassium: 3.9 mEq/L (ref 3.5–5.1)
Sodium: 136 mEq/L (ref 135–145)
Total Bilirubin: 0.6 mg/dL (ref 0.2–1.2)
Total Protein: 7.3 g/dL (ref 6.0–8.3)

## 2023-07-21 LAB — VITAMIN D 25 HYDROXY (VIT D DEFICIENCY, FRACTURES): VITD: 23.03 ng/mL — ABNORMAL LOW (ref 30.00–100.00)

## 2023-07-21 LAB — TSH: TSH: 1.99 u[IU]/mL (ref 0.35–5.50)

## 2023-07-21 MED ORDER — WEGOVY 0.25 MG/0.5ML ~~LOC~~ SOAJ
0.2500 mg | SUBCUTANEOUS | 0 refills | Status: DC
  Filled 2023-07-21: qty 2, 28d supply, fill #0

## 2023-07-21 MED ORDER — GABAPENTIN 300 MG PO CAPS
300.0000 mg | ORAL_CAPSULE | Freq: Every day | ORAL | 3 refills | Status: DC
  Filled 2023-07-21: qty 90, 90d supply, fill #0
  Filled 2023-10-24: qty 90, 90d supply, fill #1
  Filled 2024-01-19: qty 90, 90d supply, fill #2
  Filled 2024-04-26: qty 90, 90d supply, fill #3

## 2023-07-21 MED ORDER — WEGOVY 0.5 MG/0.5ML ~~LOC~~ SOAJ
0.5000 mg | SUBCUTANEOUS | 2 refills | Status: DC
  Filled 2023-08-27: qty 2, 28d supply, fill #0
  Filled 2023-09-30: qty 2, 28d supply, fill #1
  Filled 2023-10-27: qty 2, 28d supply, fill #2

## 2023-07-21 NOTE — Progress Notes (Signed)
Subjective  Chief Complaint  Patient presents with   Annual Exam    Pt here for Annual Exam and is currently fasting    HPI: Ann Watson is a 52 y.o. female who presents to [facility] [place] today for a Female Wellness Visit. She also has the concerns and/or needs as listed above in the chief complaint. These will be addressed in addition to the Health Maintenance Visit.   Wellness Visit: annual visit with health maintenance review and exam without Pap  Health maintenance: No longer needs cervical cancer screening, status post hysterectomy.  Had breast MRI recently, reviewed.  Persistent findings associated with known papilloma on the right.  Had consultation with breast surgeon, they are contemplating excision versus surveillance MRIs and biopsies.  Patient prefers excision.  Eligible for Shingrix. Chronic disease f/u and/or acute problem visit: (deemed necessary to be done in addition to the wellness visit): Poor sleep and hot flashes.  Postmenopausal, complains of multiple symptoms including poor sleep, hot flashes, increased irritability, low mood.  Using melatonin which is not completely helpful for sleep.  Admits, very stressed: Works excessively but likes job, husband is struggling with some cognitive decline and health problems.  She had been to integrative health and they started progesterone to help symptoms.  She started these about a month ago. Elevated blood pressure: No diagnosis of hypertension.  Does run in her family.  Risk factors include obesity, high stress job.  She is a former smoker.  Denies chest pain or palpitations.  Does not check blood pressures at home.  Is hesitant to start medications, her grandmother was on blood pressure medicine for many years and she is concerned that it can cause dementia.  Morbid obesity: Wanting to try GLP-1 agonist again.  Had trouble last year due to shortage.  Tolerated well.  Diet is poor.  Ice cream daily is a hard  habit for her to break.  Little exercise due to busy work schedule.  Poor sleep. Complains of mass on outer right midfoot.  Started several months ago.  Not painful but she does not like how it looks.  Assessment  1. Annual physical exam   2. Papilloma of right breast   3. Need for shingles vaccine   4. Morbid obesity (HCC) Chronic  5. Elevated blood pressure reading without diagnosis of hypertension   6. Menopausal symptoms   7. Postmenopausal hormone therapy   8. Ganglion cyst of right foot      Plan  Female Wellness Visit: Age appropriate Health Maintenance and Prevention measures were discussed with patient. Included topics are cancer screening recommendations, ways to keep healthy (see AVS) including dietary and exercise recommendations, regular eye and dental care, use of seat belts, and avoidance of moderate alcohol use and tobacco use.  Screens are current BMI: discussed patient's BMI and encouraged positive lifestyle modifications to help get to or maintain a target BMI. HM needs and immunizations were addressed and ordered. See below for orders. See HM and immunization section for updates.  First Shingrix given today, education given Routine labs and screening tests ordered including cmp, cbc and lipids where appropriate. Discussed recommendations regarding Vit D and calcium supplementation (see AVS)  Chronic disease management visit and/or acute problem visit: Elevated blood pressure with family history of hypertension: Recommend low-salt diet, weight loss, home blood pressure readings and recheck in 6 to 8 weeks.  Can consider blood pressure medications at that time if remains elevated.  Started the conversation.  Check renal function, electrolytes and lipids Morbid obesity: Continues to struggle with diet.  Now added stressors are making life a little bit more difficult for her.  Problem solved.  Start Wegovy 0.25 mg weekly for 1 month then increase to 0.5 mg weekly. Screening  for lipid disorders and diabetes Ganglion cyst right foot: Patient has orthopedics for follow-up Menopausal symptoms and secondary insomnia: Start gabapentin 300 mg nightly.  Education given.  Continue progesterone. Papilloma right breast: Likely benign but due to persistent findings, would like to have excision performed.  Dr. Dwain Sarna is following up with her soon.  Follow up: 6 to 8 weeks to recheck weight and blood pressure and menopausal symptoms Orders Placed This Encounter  Procedures   Zoster Recombinant (Shingrix )   VITAMIN D 25 Hydroxy (Vit-D Deficiency, Fractures)   CBC with Differential/Platelet   Comprehensive metabolic panel   Lipid panel   TSH   Hemoglobin A1c   Meds ordered this encounter  Medications   gabapentin (NEURONTIN) 300 MG capsule    Sig: Take 1 capsule (300 mg total) by mouth at bedtime.    Dispense:  90 capsule    Refill:  3   WEGOVY 0.25 MG/0.5ML SOAJ    Sig: Inject 0.25 mg into the skin once a week.    Dispense:  2 mL    Refill:  0   WEGOVY 0.5 MG/0.5ML SOAJ    Sig: Inject 0.5 mg into the skin once a week.    Dispense:  2 mL    Refill:  2      Body mass index is 45.35 kg/m. Wt Readings from Last 3 Encounters:  07/21/23 256 lb (116.1 kg)  08/21/22 249 lb 6.4 oz (113.1 kg)  08/12/22 254 lb 9.6 oz (115.5 kg)     Patient Active Problem List   Diagnosis Date Noted Date Diagnosed   Morbid obesity (HCC) 09/13/2019     Priority: High    Trial of Wegovy, July 2024    Cervical radiculopathy 12/21/2015     Priority: High    [place], ortho    Papilloma of right breast 07/21/2023     Priority: Medium     Nipple discharge eval: breast MRI and then biopsy showing papilloma: f/u MRI with persistent findings. Dr. Dwain Sarna consulted 06/2023    Postmenopausal hormone therapy 07/21/2023     Priority: Medium     Status post complete hysterectomy, started progesterone 2024 through integrative health, [facility]    Facet arthritis, degenerative,  lumbar spine 08/21/2022     Priority: Medium     L4-5, L5-S1 by lumbar MRI 07/2022, Dr. Norlene Campbell, [facility]    Radiculopathy, lumbar region 08/15/2022     Priority: Medium    Sensorineural hearing loss (SNHL), bilateral 09/10/2018     Priority: Medium    Nephrolithiasis 03/09/2018     Priority: Medium    Rosacea 03/15/2022     Priority: Low   S/P laparoscopic hysterectomy 06/12/2021     Priority: Low   Internal hemorrhoids with prolapse/pain 08/07/2011     Priority: Low   Elevated blood pressure reading without diagnosis of hypertension 07/21/2023     Strong family history, 2 elevated readings, July 2024.  To start home readings, low-salt diet and recheck    Health Maintenance  Topic Date Due   COVID-19 Vaccine (3 - Pfizer risk series) 08/06/2023 (Originally 01/26/2021)   INFLUENZA VACCINE  07/24/2023   Zoster Vaccines- Shingrix (2 of 2) 09/15/2023   MAMMOGRAM  05/01/2024  Fecal DNA (Cologuard)  04/14/2025   DTaP/Tdap/Td (4 - Td or Tdap) 06/25/2028   Hepatitis C Screening  Completed   HIV Screening  Completed   HPV VACCINES  Aged Out   PAP SMEAR-Modifier  Discontinued   Immunization History  Administered Date(s) Administered   Influenza, Seasonal, Injecte, Preservative Fre 10/21/2014, 10/13/2015   Influenza,inj,Quad PF,6+ Mos 09/13/2019, 09/12/2020   Influenza-Unspecified 11/28/2021   Moderna SARS-COV2 Booster Vaccination 12/29/2020   PFIZER(Purple Top)SARS-COV-2 Vaccination 03/03/2020, 03/27/2020   Td 12/23/2010   Tdap 01/23/2016, 06/25/2018   Zoster Recombinant(Shingrix) 07/21/2023   We updated and reviewed the patient's past history in detail and it is documented below. Allergies: Patient is allergic to bee venom, kiwi extract, and mango flavor. Past Medical History Patient  has a past medical history of Concussion, External hemorrhoid, thrombosed (08/07/2011), Facet arthritis, degenerative, lumbar spine (08/21/2022), Fibroids (06/04/2021), Hemorrhoids, History  of kidney stones, HSV infection, and Wears glasses. Past Surgical History Patient  has a past surgical history that includes Kidney stone surgery; ORIF ankle fracture (Right, 01/15/2014); Multiple tooth extractions; Dilatation & currettage/hysteroscopy with hydrothermal ablation (N/A, 08/19/2018); Robotic assisted laparoscopic hysterectomy and salpingectomy (N/A, 06/12/2021); Breast biopsy (Right, 05/09/2023); and Abdominal hysterectomy (2022). Family History: Patient family history includes Arthritis in her maternal grandmother and paternal grandfather; Birth defects in her paternal grandfather; Cancer in her paternal grandfather; Depression in her maternal grandmother; Diabetes in her maternal grandfather and mother; Drug abuse in her father; Early death in her father and paternal grandfather; Heart disease in her maternal grandfather, mother, and paternal grandfather; Hyperlipidemia in her maternal grandmother; Hypertension in her maternal grandmother and mother; Thyroid cancer (age of onset: 55) in her maternal aunt. Social History:  Patient  reports that she quit smoking about 15 years ago. Her smoking use included cigarettes. She started smoking about 38 years ago. She has a 34.5 pack-year smoking history. She has never used smokeless tobacco. She reports current alcohol use of about 1.0 - 2.0 standard drink of alcohol per week. She reports current drug use. Drug: Marijuana.  Review of Systems: Constitutional: negative for fever or malaise Ophthalmic: negative for photophobia, double vision or loss of vision Cardiovascular: negative for chest pain, dyspnea on exertion, or new LE swelling Respiratory: negative for SOB or persistent cough Gastrointestinal: negative for abdominal pain, change in bowel habits or melena Genitourinary: negative for dysuria or gross hematuria, no abnormal uterine bleeding or disharge Musculoskeletal: negative for new gait disturbance or muscular  weakness Integumentary: negative for new or persistent rashes, no breast lumps Neurological: negative for TIA or stroke symptoms Psychiatric: negative for SI or delusions Allergic/Immunologic: negative for hives  Patient Care Team    Relationship Specialty Notifications Start End  Willow Ora, MD PCP - General Family Medicine  09/13/19   August Saucer Corrie Mckusick, MD Consulting Physician Orthopedic Surgery  09/13/19   Ob/Gyn, Nestor Ramp Consulting Physician Gynecology  09/13/19   Gerald Leitz, MD Consulting Physician Obstetrics and Gynecology  01/01/22   Emelia Loron, MD Consulting Physician General Surgery  07/07/23   Haverstock, Elvin So, MD Consulting Physician Dermatology  07/21/23   Norlene Campbell Consulting Physician Orthopedic Surgery  07/21/23    Comment: Cyndia Skeeters    Objective  Vitals: BP (!) 136/98   Pulse 65   Temp 98.3 F (36.8 C)   Ht 5\' 3"  (1.6 m)   Wt 256 lb (116.1 kg)   SpO2 97%   BMI 45.35 kg/m  General:  Well developed, well nourished, no acute distress  Psych:  Alert and orientedx3,normal mood and affect however appears stressed HEENT:  Normocephalic, atraumatic, non-icteric sclera,  supple neck without adenopathy, mass or thyromegaly Cardiovascular:  Normal S1, S2, RRR without gallop, rub or murmur Respiratory:  Good breath sounds bilaterally, CTAB with normal respiratory effort Gastrointestinal: normal bowel sounds, soft, non-tender, no noted masses. No HSM MSK: extremities without edema, joints without erythema or swelling, right nontender mobile cystic nodule over fifth metatarsal base Neurologic:    Mental status is normal.  Gross motor and sensory exams are normal.  No tremor  Commons side effects, risks, benefits, and alternatives for medications and treatment plan prescribed today were discussed, and the patient expressed understanding of the given instructions. Patient is instructed to call or message via MyChart if he/she has any questions or  concerns regarding our treatment plan. No barriers to understanding were identified. We discussed Red Flag symptoms and signs in detail. Patient expressed understanding regarding what to do in case of urgent or emergency type symptoms.  Medication list was reconciled, printed and provided to the patient in AVS. Patient instructions and summary information was reviewed with the patient as documented in the AVS. This note was prepared with assistance of Dragon voice recognition software. Occasional wrong-word or sound-a-like substitutions may have occurred due to the inherent limitations of voice recognition software

## 2023-07-22 ENCOUNTER — Encounter: Payer: Self-pay | Admitting: Family Medicine

## 2023-07-22 NOTE — Progress Notes (Signed)
Labs reviewed.  The 10-year ASCVD risk score (Arnett DK, et al., 2019) is: 1.8%   Values used to calculate the score:     Age: 52 years     Sex: Female     Is Non-Hispanic African American: No     Diabetic: No     Tobacco smoker: No     Systolic Blood Pressure: 136 mmHg     Is BP treated: No     HDL Cholesterol: 53.3 mg/dL     Total Cholesterol: 174 mg/dL

## 2023-07-23 ENCOUNTER — Other Ambulatory Visit (HOSPITAL_COMMUNITY): Payer: Self-pay

## 2023-07-24 ENCOUNTER — Telehealth: Payer: Self-pay

## 2023-07-24 ENCOUNTER — Other Ambulatory Visit (HOSPITAL_COMMUNITY): Payer: Self-pay

## 2023-07-24 ENCOUNTER — Other Ambulatory Visit (HOSPITAL_BASED_OUTPATIENT_CLINIC_OR_DEPARTMENT_OTHER): Payer: Self-pay

## 2023-07-24 NOTE — Telephone Encounter (Signed)
Pharmacy Patient Advocate Encounter   Received notification from Fax that prior authorization for [facility] is required/requested.   Insurance verification completed.   The patient is insured through  [employer]  .   Per test claim: PA required; PA submitted to [facility] via Prompt PA Key/confirmation #/[id number] Status is pending

## 2023-07-25 ENCOUNTER — Other Ambulatory Visit (HOSPITAL_COMMUNITY): Payer: Self-pay

## 2023-07-25 NOTE — Telephone Encounter (Signed)
Form has been completed and placed up front at check in. Pt has been notified.

## 2023-07-25 NOTE — Telephone Encounter (Signed)
Pharmacy Patient Advocate Encounter  Received notification from  [employer]  that Prior Authorization for Ann Watson has been APPROVED from 07/24/23 to 02/21/24. Ran test claim, Copay is $24.99  PA #/Case ID/Reference #: [id number]

## 2023-07-30 ENCOUNTER — Other Ambulatory Visit (HOSPITAL_BASED_OUTPATIENT_CLINIC_OR_DEPARTMENT_OTHER): Payer: Self-pay

## 2023-07-31 ENCOUNTER — Other Ambulatory Visit (HOSPITAL_BASED_OUTPATIENT_CLINIC_OR_DEPARTMENT_OTHER): Payer: Self-pay

## 2023-08-04 ENCOUNTER — Ambulatory Visit (INDEPENDENT_AMBULATORY_CARE_PROVIDER_SITE_OTHER): Payer: Commercial Managed Care - PPO | Admitting: Podiatrist

## 2023-08-04 ENCOUNTER — Encounter: Payer: Self-pay | Admitting: Podiatrist

## 2023-08-04 DIAGNOSIS — M6740 Ganglion, unspecified site: Secondary | ICD-10-CM

## 2023-08-04 NOTE — Patient Instructions (Signed)
Keep your bandage on your foot for 3-4 hours.  Then you may remove.  You may bruise in the area- this is normal and should resolve in a week to 10 days.    Ganglion Cyst  A ganglion cyst is a non-cancerous, fluid-filled lump of tissue that occurs near a joint, tendon, or ligament. The cyst grows out of a joint or the lining of a tendon or ligament. Ganglion cysts most often develop in the hand or wrist, but they can also develop in the shoulder, elbow, hip, knee, ankle, or foot. Ganglion cysts are ball-shaped or egg-shaped. Their size can range from the size of a pea to larger than a grape. Increased activity may cause the cyst to get bigger because more fluid starts to build up. What are the causes? The exact cause of this condition is not known, but it may be related to: Inflammation or irritation around the joint. An injury or tear in the layers of tissue around the joint (joint capsule). Repetitive movements or overuse. History of acute or repeated injury. What increases the risk? You are more likely to develop this condition if: You are a female. You are 53-64 years old. What are the signs or symptoms? The main symptom of this condition is a lump. It most often appears on the hand or wrist. In many cases, there are no other symptoms, but a cyst can sometimes cause: Tingling. Pain or tenderness. Numbness. Weakness or loss of strength in the affected joint. Decreased range of motion in the affected area of the body. How is this diagnosed? Ganglion cysts are usually diagnosed based on a physical exam. Your health care provider will feel the lump and may shine a light next to it. If it is a ganglion cyst, the light will likely shine through it. Your health care provider may order an X-ray, ultrasound, MRI, or CT scan to rule out other conditions. How is this treated? Ganglion cysts often go away on their own without treatment. If you have pain or other symptoms, treatment may be needed.  Treatment is also needed if the ganglion cyst limits your movement or if it gets infected. Treatment may include: Wearing a brace or splint on your wrist or finger. Taking anti-inflammatory medicine. Having fluid drained from the lump with a needle (aspiration). Getting an injection of medicine into the joint to decrease inflammation. This may be corticosteroids, ethanol, or hyaluronidase. Having surgery to remove the ganglion cyst. Placing a pad in your shoe or wearing shoes that will not rub against the cyst if it is on your foot. Follow these instructions at home: Do not press on the ganglion cyst, poke it with a needle, or hit it. Take over-the-counter and prescription medicines only as told by your health care provider. If you have a brace or splint: Wear it as told by your health care provider. Remove it as told by your health care provider. Ask if you need to remove it when you take a shower or a bath. Watch your ganglion cyst for any changes. Keep all follow-up visits as told by your health care provider. This is important. Contact a health care provider if: Your ganglion cyst becomes larger or more painful. You have pus coming from the lump. You have weakness or numbness in the affected area. You have a fever or chills. Get help right away if: You have a fever and have any of these in the cyst area: Increased redness. Red streaks. Swelling. Summary A ganglion cyst  is a non-cancerous, fluid-filled lump that occurs near a joint, tendon, or ligament. Ganglion cysts most often develop in the hand or wrist, but they can also develop in the shoulder, elbow, hip, knee, ankle, or foot. Ganglion cysts often go away on their own without treatment. This information is not intended to replace advice given to you by your health care provider. Make sure you discuss any questions you have with your health care provider. Document Revised: 03/01/2020 Document Reviewed: 03/01/2020 Elsevier  Patient Education  2024 [employer].
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
## 2023-08-07 ENCOUNTER — Encounter (INDEPENDENT_AMBULATORY_CARE_PROVIDER_SITE_OTHER): Payer: Self-pay

## 2023-08-27 ENCOUNTER — Other Ambulatory Visit (HOSPITAL_BASED_OUTPATIENT_CLINIC_OR_DEPARTMENT_OTHER): Payer: Self-pay

## 2023-09-22 ENCOUNTER — Encounter: Payer: Self-pay | Admitting: Family Medicine

## 2023-09-22 ENCOUNTER — Ambulatory Visit (INDEPENDENT_AMBULATORY_CARE_PROVIDER_SITE_OTHER): Payer: Commercial Managed Care - PPO | Admitting: Family Medicine

## 2023-09-22 DIAGNOSIS — N951 Menopausal and female climacteric states: Secondary | ICD-10-CM

## 2023-09-22 DIAGNOSIS — R030 Elevated blood-pressure reading, without diagnosis of hypertension: Secondary | ICD-10-CM

## 2023-09-22 DIAGNOSIS — Z79890 Hormone replacement therapy (postmenopausal): Secondary | ICD-10-CM | POA: Diagnosis not present

## 2023-09-22 DIAGNOSIS — Z23 Encounter for immunization: Secondary | ICD-10-CM

## 2023-09-22 MED ORDER — WEGOVY 1 MG/0.5ML ~~LOC~~ SOAJ
1.0000 mg | SUBCUTANEOUS | 5 refills | Status: DC
  Filled 2023-11-12 – 2023-11-25 (×2): qty 2, 28d supply, fill #0
  Filled 2023-12-18: qty 2, 28d supply, fill #1
  Filled 2024-01-19: qty 2, 28d supply, fill #2
  Filled 2024-02-17: qty 2, 28d supply, fill #3
  Filled 2024-03-25: qty 2, 28d supply, fill #4

## 2023-09-22 NOTE — Patient Instructions (Signed)
Please return in 3-6 months to recheck weight and blood pressure  If you have any questions or concerns, please don't hesitate to send me a message via MyChart or call the office at [phone]. Thank you for visiting with [place] today! It's our pleasure caring for you.
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
## 2023-10-20 ENCOUNTER — Ambulatory Visit: Payer: Commercial Managed Care - PPO | Admitting: Family Medicine

## 2023-10-27 ENCOUNTER — Other Ambulatory Visit (HOSPITAL_BASED_OUTPATIENT_CLINIC_OR_DEPARTMENT_OTHER): Payer: Self-pay

## 2023-10-27 ENCOUNTER — Telehealth: Payer: Self-pay | Admitting: Podiatrist

## 2023-10-27 NOTE — Telephone Encounter (Signed)
PT CALLED TO HAVE SURGERY SET UP, PT WAS SEEN BACK IN AUGUST AND WAS TOLD THAT IF THINGS GOT WORSE, THEN TO CALL AND SET UP SURGERY. THE BEST NUMBER TO REACH HER AT IS [phone]

## 2023-10-29 ENCOUNTER — Ambulatory Visit (INDEPENDENT_AMBULATORY_CARE_PROVIDER_SITE_OTHER): Payer: Commercial Managed Care - PPO | Admitting: Podiatry

## 2023-10-29 ENCOUNTER — Encounter: Payer: Self-pay | Admitting: Podiatry

## 2023-10-29 DIAGNOSIS — M67471 Ganglion, right ankle and foot: Secondary | ICD-10-CM | POA: Diagnosis not present

## 2023-10-29 DIAGNOSIS — M6740 Ganglion, unspecified site: Secondary | ICD-10-CM

## 2023-10-30 NOTE — Progress Notes (Signed)
Subjective:   Patient ID: Ann Watson, female   DOB: 52 y.o.   MRN: [id number]   HPI Patient presents with large mass on the lateral aspect of the right foot that is become painful and hard for her to wear shoe gear with.  Patient states that she has tried having it drained and only was better for around a week and that was done around 3 months ago   ROS      Objective:  Physical Exam  Neurovascular status intact large mass lateral side of right foot measuring about 2 cm x 2 cm appears to be free movable with subcutaneous tissue and was not drained at 1 time with a gelatinous fluid     Assessment:  Large ganglionic cyst formation lateral right     Plan:  H&P reviewed and I did discuss the difficulty of these as far as not reoccurring.  Given the fact it was only better for 1 week with drainage I did discuss retraining again versus surgical excision and she is opted for surgical excision.  I allowed her to read the consent form going over the surgery itself alternative treatments complications and after review she wants to have this fixed.  Patient understands no guarantee as far as success of surgery all complications that can occur and is willing to accept risk and signed consent form after extensive review.  Patient scheduled outpatient surgery and I did dispense air fracture walker today as I want to keep her completely immobilized for several weeks to allow hopeful healing and sealing of any leaks in the ganglion area.  Encouraged her to call with questions and she will call to set her date of

## 2023-11-11 ENCOUNTER — Encounter (HOSPITAL_BASED_OUTPATIENT_CLINIC_OR_DEPARTMENT_OTHER): Payer: Self-pay

## 2023-11-12 ENCOUNTER — Telehealth: Payer: Self-pay | Admitting: Podiatry

## 2023-11-12 ENCOUNTER — Other Ambulatory Visit (HOSPITAL_BASED_OUTPATIENT_CLINIC_OR_DEPARTMENT_OTHER): Payer: Self-pay

## 2023-11-12 NOTE — Telephone Encounter (Signed)
DOS-11/18/2023  EXC. GANGLION [id number]  [facility] EFFECTIVE DATE- 03/24/2019  DEDUCTIBLE- $1750.00 WITH REMAINING $0.00 OOP-$4000.00 WITH REMAINING $415.85  COINSURANCE- 20%   SPOKE WITH PAUL S. FROM [facility] AND HE STATED THAT PRIOR AUTH IS NOT REQUIRED FOR CPT CODE 78295.  CALL REFERENCE NUMBER: [id number]

## 2023-11-17 ENCOUNTER — Other Ambulatory Visit (HOSPITAL_BASED_OUTPATIENT_CLINIC_OR_DEPARTMENT_OTHER): Payer: Self-pay

## 2023-11-17 MED ORDER — HYDROCODONE-ACETAMINOPHEN 10-325 MG PO TABS
1.0000 | ORAL_TABLET | Freq: Three times a day (TID) | ORAL | 0 refills | Status: AC | PRN
  Filled 2023-11-17: qty 15, 5d supply, fill #0

## 2023-11-17 NOTE — Addendum Note (Signed)
Addended by: Lenn Sink on: 11/17/2023 01:59 PM   Modules accepted: Orders

## 2023-11-18 DIAGNOSIS — M67471 Ganglion, right ankle and foot: Secondary | ICD-10-CM | POA: Diagnosis not present

## 2023-11-24 ENCOUNTER — Ambulatory Visit (INDEPENDENT_AMBULATORY_CARE_PROVIDER_SITE_OTHER): Payer: Commercial Managed Care - PPO | Admitting: Podiatry

## 2023-11-24 ENCOUNTER — Encounter: Payer: Self-pay | Admitting: Podiatry

## 2023-11-24 ENCOUNTER — Ambulatory Visit (INDEPENDENT_AMBULATORY_CARE_PROVIDER_SITE_OTHER): Payer: Commercial Managed Care - PPO

## 2023-11-24 VITALS — Ht 63.0 in | Wt 250.0 lb

## 2023-11-24 DIAGNOSIS — M6740 Ganglion, unspecified site: Secondary | ICD-10-CM

## 2023-11-25 ENCOUNTER — Other Ambulatory Visit (HOSPITAL_BASED_OUTPATIENT_CLINIC_OR_DEPARTMENT_OTHER): Payer: Self-pay

## 2023-11-25 ENCOUNTER — Other Ambulatory Visit: Payer: Self-pay

## 2023-11-25 NOTE — Progress Notes (Signed)
Subjective:   Patient ID: Ann Watson, female   DOB: 52 y.o.   MRN: [id number]   HPI Patient presents stating she is doing very well having minimal discomfort   ROS      Objective:  Physical Exam  Neurovascular status intact with the right lateral foot healing well wound edges well coapted with no indications of enlargement in the area     Assessment:  Doing well post ganglionic cyst or other soft tissue mass excision right     Plan:  H&P reviewed and we are going to keep compression on the area and I explained the importance of this for the next 4 to 6 weeks and dispensed surgical shoe that she can start wearing even though I like her to wear the boot over the next couple weeks at least part-time.  Reappoint to recheck and there was some soreness in the area precautionary x-ray taken  X-rays indicate no signs that there is any bone pathology associated with discomfort at this time

## 2023-12-08 ENCOUNTER — Encounter: Payer: Commercial Managed Care - PPO | Admitting: Podiatry

## 2023-12-23 ENCOUNTER — Encounter: Payer: Self-pay | Admitting: Family Medicine

## 2023-12-23 DIAGNOSIS — R040 Epistaxis: Secondary | ICD-10-CM

## 2023-12-23 DIAGNOSIS — R430 Anosmia: Secondary | ICD-10-CM

## 2023-12-29 ENCOUNTER — Ambulatory Visit (INDEPENDENT_AMBULATORY_CARE_PROVIDER_SITE_OTHER): Payer: Commercial Managed Care - PPO | Admitting: Otolaryngology

## 2023-12-29 ENCOUNTER — Other Ambulatory Visit (HOSPITAL_BASED_OUTPATIENT_CLINIC_OR_DEPARTMENT_OTHER): Payer: Self-pay

## 2023-12-29 ENCOUNTER — Encounter (INDEPENDENT_AMBULATORY_CARE_PROVIDER_SITE_OTHER): Payer: Self-pay

## 2023-12-29 VITALS — HR 74 | Ht 64.0 in | Wt 241.0 lb

## 2023-12-29 DIAGNOSIS — R438 Other disturbances of smell and taste: Secondary | ICD-10-CM | POA: Diagnosis not present

## 2023-12-29 DIAGNOSIS — R040 Epistaxis: Secondary | ICD-10-CM | POA: Diagnosis not present

## 2023-12-29 MED ORDER — FLUTICASONE PROPIONATE 50 MCG/ACT NA SUSP
2.0000 | Freq: Two times a day (BID) | NASAL | 6 refills | Status: DC
  Filled 2023-12-29: qty 16, 28d supply, fill #0
  Filled 2024-01-19: qty 16, 28d supply, fill #1

## 2023-12-29 MED ORDER — AYR SALINE NASAL NA GEL
1.0000 | NASAL | 5 refills | Status: DC | PRN
  Filled 2023-12-29: qty 16, 30d supply, fill #0

## 2023-12-29 NOTE — Patient Instructions (Signed)
 Use ayr gel every 4 hours as needed Use flonase  spray two sprays each nostril twice per day until follow up  Aureliano Med Nasal Saline Rinse  - start nasal saline rinses with NeilMed Bottle available over the counter    Nasal Saline Irrigation instructions: If you choose to make your own salt water solution, You will need: Salt (kosher, canning, or pickling salt) Baking soda Nasal irrigation bottle (i.e. Aureliano Med Sinus Rinse) Measuring spoon ( teaspoon) Distilled / boiled water   Mix solution Mix 1 teaspoon of salt, 1/2 teaspoon of baking soda and 1 cup of water into irrigation bottle ** May use saline packet instead of homemade recipe for this step if you prefer If medicine was prescribed to be mixed with solution, place this into bottle Examples 2 inches of 2% mupirocin ointment Budesonide solution Position your head: Lean over sink (about 45 degrees) Rotate head (about 45 degrees) so that one nostril is above the other Irrigate Insert tip of irrigation bottle into upper nostril so it forms a comfortable seal Irrigate while breathing through your mouth May remove the straw from the bottle in order to irrigate the entire solution (important if medicine was added) Exhale through nose when finished and blow nose as necessary  Repeat on opposite side with other 1/2 of solution (120 mL) or remake solution if all 240 mL was used on first side Wash irrigation bottle regularly, replace every 3 months

## 2023-12-29 NOTE — Progress Notes (Signed)
 Dear Dr. Jodie, Here is my assessment for our mutual patient, Ann Watson. Thank you for allowing me the opportunity to care for your patient. Please do not hesitate to contact me should you have any other questions. Sincerely, Dr. Eldora Blanch  Otolaryngology Clinic Note  HISTORY: Ann Watson is a 53 y.o. female with kindly referred by Dr. Jodie for evaluation of epistaxis and loss in sense of smell.  Initial visit (12/25/2023): Noted epistaxis and diminished sense of smell. Reports she had COVID Dec 2024 and Dec 2023 ,but noted it in [place] 2024 (could not smell old yogurt). Not immediately after Dec 2023 episode of COVID.  Left side can't smell as well out of significantly. Right side also diminished. No phantosmia, dysosmia. Taste is fine. No facial pressure/pain (mild left), some left side nasal congestion (all the time). No discolored nasal drainage. No typical AR symptoms. She is trying the smell retraining kits from [employer] (just got them this weekend). No allergy testing, no prior CT scan.  Never tried any nasal meds No neurological changes. Woodworker (but uses face mask N95). Dust particles could be part of problem?  Also noting left side epistaxis. Every 3-4 days epistaxis, maybe related to nasal dryness. Started sept. Few drops. No nasal trauma. Stops on own.  No previous sinonasal surgery.  GLP-1: Wegovy  AP/AC: no  H&N Surgery: no  PMHx: Arthritis, Insomnia, borderline HTN  RADIOGRAPHIC EVALUATION AND INDEPENDENT REVIEW OF OTHER RECORDS:: Dr. Lavern (PCP) Referral notes (11/2023): Slowly losing sense of smell, thought COVID but now maybe menopause; also noted epistaxis; Dx: likely COVID related, but ref to ENT Dr. 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; Rx: HA consult, f/u as needed, noise precautions  Past Medical  History:  Diagnosis Date   Concussion    age 68, no residual   External hemorrhoid, thrombosed 08/07/2011   Facet arthritis, degenerative, lumbar spine 08/21/2022   L4-5, L5-S1 by lumbar MRI 07/2022   Fibroids 06/04/2021   Hemorrhoids    History of kidney stones    surgery to remove 5-6 yrs ago as of 06-04-2021 per pt   HSV infection    Wears glasses    Past Surgical History:  Procedure Laterality Date   ABDOMINAL HYSTERECTOMY  2022   BREAST BIOPSY Right 05/09/2023   DILITATION & CURRETTAGE/HYSTROSCOPY WITH HYDROTHERMAL ABLATION N/A 08/19/2018   Procedure: DILATATION & CURETTAGE/HYSTEROSCOPY WITH HYDROTHERMAL ABLATION;  Surgeon: Ozan, Jennifer, DO;  Location: [facility];  Service: Gynecology;  Laterality: N/A;   KIDNEY STONE SURGERY     5 to 6 yrs ago cystoscopy done per pt as of 06-04-2021   MULTIPLE TOOTH EXTRACTIONS     poor dental last done 2 months ago as of 06-04-2021   ORIF ANKLE FRACTURE Right 01/15/2014   Procedure: OPEN REDUCTION INTERNAL FIXATION (ORIF) ANKLE FRACTURE;  Surgeon: Cordella Glendia Hutchinson, MD;  Location: [facility];  Service: Orthopedics;  Laterality: Right;  OPEN REDUCTION INTERNAL FIXATION BI-MALLEOLAR ANKLE FRACTURE (SMITH-NEPHEW   ROBOTIC ASSISTED LAPAROSCOPIC HYSTERECTOMY AND SALPINGECTOMY N/A 06/12/2021   Procedure: XI ROBOTIC ASSISTED LAPAROSCOPIC HYSTERECTOMY AND BILATERAL SALPINGECTOMY;  Surgeon: Rosalva Sawyer, MD;  Location: [facility];  Service: Gynecology;  Laterality: N/A;   Family History  Problem Relation Age of Onset   Diabetes Mother    Heart disease Mother    Hypertension Mother    Early death Father  Drug abuse Father    Thyroid  cancer Maternal Aunt 60   Hyperlipidemia Maternal Grandmother    Hypertension Maternal Grandmother    Arthritis Maternal Grandmother    Depression Maternal Grandmother    Diabetes Maternal Grandfather    Heart disease Maternal Grandfather    Arthritis Paternal Grandfather    Birth defects Paternal Grandfather     Cancer Paternal Grandfather    Early death Paternal Grandfather    Heart disease Paternal Grandfather    Social History   Tobacco Use   Smoking status: Former    Current packs/day: 0.00    Average packs/day: 1.5 packs/day for 23.0 years (34.5 ttl pk-yrs)    Types: Cigarettes    Start date: 08/09/1984    Quit date: 07/31/2007    Years since quitting: 16.4   Smokeless tobacco: Never  Substance Use Topics   Alcohol use: Yes    Alcohol/week: 1.0 - 2.0 standard drink of alcohol    Types: 1 - 2 Glasses of wine per week    Comment: wine -occ   Allergies  Allergen Reactions   Bee Venom Anaphylaxis   Kiwi Extract Anaphylaxis   Mango Flavoring Agent (Non-Screening) Hives    Oil in mango   Current Outpatient Medications  Medication Sig Dispense Refill   fluticasone  (FLONASE ) 50 MCG/ACT nasal spray Place 2 sprays into both nostrils in the morning and at bedtime. 16 g 6   gabapentin  (NEURONTIN ) 300 MG capsule Take 1 capsule (300 mg total) by mouth at bedtime. 90 capsule 3   progesterone  (PROMETRIUM ) 200 MG capsule Take 200 mg by mouth daily.     saline (AYR) GEL Place 1 Application into both nostrils every 4 (four) hours as needed. 16 g 5   Semaglutide -Weight Management (WEGOVY ) 1 MG/0.5ML SOAJ Inject 1 mg into the skin once a week. 2 mL 5   WEGOVY  0.5 MG/0.5ML SOAJ Inject 0.5 mg into the skin once a week. 2 mL 2   No current facility-administered medications for this visit.   Pulse 74   Ht 5' 4 (1.626 m)   Wt 241 lb (109.3 kg)   LMP 05/23/2021   SpO2 95%   BMI 41.37 kg/m   PHYSICAL EXAM:  Pulse 74   Ht 5' 4 (1.626 m)   Wt 241 lb (109.3 kg)   LMP 05/23/2021   SpO2 95%   BMI 41.37 kg/m   Salient findings:  CN II-XII intact  Bilateral EAC clear and TM intact with well pneumatized middle ear spaces Nose: Anterior rhinoscopy reveals septum mild deviation left, bilateral modest inferior turbinate hypertrophy.  Nasal endoscopy was indicated to better evaluate the nose and  paranasal sinuses, given the patient's history and exam findings, and is detailed below. No lesions of oral cavity/oropharynx; dentition fair No obviously palpable neck masses/lymphadenopathy/thyromegaly No respiratory distress or stridor   PROCEDURE: Diagnostic Nasal Endoscopy Pre-procedure diagnosis: Concern for nasal mass or lesion causing hyposmia, epistaxis rule out structural lesion Post-procedure diagnosis: same Indication: See pre-procedure diagnosis and physical exam above Complications: None apparent EBL: 0 mL Anesthesia: Lidocaine  4% and topical decongestant was topically sprayed in each nasal cavity  Description of Procedure:  Patient was identified. A rigid 30 degree endoscope was utilized to evaluate the sinonasal cavities, mucosa, sinus ostia and turbinates and septum.  Overall, signs of mucosal inflammation are mild with some mucoid secretions over left MM and NP with small amount of crusting.  Also noted are no masses along visualized olfactory cleft.  No mucopurulence, polyps,  or masses noted. Mild prominent septal vessels left anterior septum. Right Middle meatus: clear Right SE Recess: clear Left MM: clear Left SE Recess: clear   Photodocumentation was obtained.  CPT CODE -- 68768 - Mod 25  ASSESSMENT:  53 yo F with:  1. Hyposmia   2. Epistaxis    Has had multiple episodes of COVID, but chronologically does not seem to be related as she denies hyposmia starting in Dec 2023. Thinks left side is worse, but no taste change. On endo, given recent URI, she does have some mucoid secretions and mild mucosal edema. No AR sx, polyps, episodes of sinusitis. We discussed DDX including inflammatory, post-viral/COVID, possible neurologic, query dust exposure (does wear N95), idiopathic, and other causes. Seems most sensible to start with inflammatory and treat her empirically, which she agrees.   Also noting left side epistaxis, d/w pt cautery v/s humidification. Will start  with humidification  PLAN: We've discussed issues and options today.  We reviewed the nasal endoscopy images together.  The risks, benefits and alternatives were discussed and questions answered.  She has elected to proceed with:  1) Start flonase  BID 2) Start Ayr gel q4h PRN 3) Use smell retraining kit and Daily Sinus Rinse 4) Discussed MRI but deferred by patient 5) Follow-up in 3 months -- sooner as necessary.    See below regarding exact medications prescribed this encounter including dosages and route: Meds ordered this encounter  Medications   saline (AYR) GEL    Sig: Place 1 Application into both nostrils every 4 (four) hours as needed.    Dispense:  16 g    Refill:  5   fluticasone  (FLONASE ) 50 MCG/ACT nasal spray    Sig: Place 2 sprays into both nostrils in the morning and at bedtime.    Dispense:  16 g    Refill:  6     Thank you for allowing me the opportunity to care for your patient. Please do not hesitate to contact me should you have any other questions.  Sincerely, Eldora Blanch, MD Otolarynoglogist (ENT), [facility] Phone: [phone] Fax: [phone]  MDM:  Level 4: [phone] Complexity/Problems addressed: chronic problems - multiple Data complexity: mod - independent interpretation of multiple notes, labs - Morbidity: mod  - Prescription Drug prescribed or managed: yes  12/30/2023, 8:05 AM

## 2023-12-30 ENCOUNTER — Encounter: Payer: Self-pay | Admitting: Family Medicine

## 2023-12-30 ENCOUNTER — Encounter (INDEPENDENT_AMBULATORY_CARE_PROVIDER_SITE_OTHER): Payer: Self-pay

## 2023-12-30 DIAGNOSIS — R438 Other disturbances of smell and taste: Secondary | ICD-10-CM | POA: Insufficient documentation

## 2024-03-15 ENCOUNTER — Encounter: Payer: Self-pay | Admitting: Family Medicine

## 2024-03-26 ENCOUNTER — Other Ambulatory Visit (HOSPITAL_BASED_OUTPATIENT_CLINIC_OR_DEPARTMENT_OTHER): Payer: Self-pay

## 2024-03-26 ENCOUNTER — Other Ambulatory Visit (HOSPITAL_COMMUNITY): Payer: Self-pay

## 2024-03-26 ENCOUNTER — Telehealth: Payer: Self-pay | Admitting: Pharmacy Technician

## 2024-03-26 NOTE — Telephone Encounter (Signed)
 Pharmacy Patient Advocate Encounter   Received notification from CoverMyMeds that prior authorization for Wegovy 1MG /0.5ML auto-injectors is required/requested.   Insurance verification completed.   The patient is insured through [facility] .   Per test claim: PA required; PA submitted to above mentioned insurance via Prompt PA Key/confirmation #/EOC [id number] Status is pending

## 2024-03-26 NOTE — Telephone Encounter (Signed)
 Noted.

## 2024-03-29 ENCOUNTER — Other Ambulatory Visit (HOSPITAL_COMMUNITY): Payer: Self-pay

## 2024-03-29 ENCOUNTER — Ambulatory Visit (INDEPENDENT_AMBULATORY_CARE_PROVIDER_SITE_OTHER): Payer: Commercial Managed Care - PPO

## 2024-03-29 NOTE — Telephone Encounter (Signed)
 Pharmacy Patient Advocate Encounter  Received notification from [facility] that Prior Authorization for [facility] 1MG  has been DENIED.  No reason given; No denial letter received via Fax or CMM. It has been requested and will be uploaded to the media tab once received.   PA #/Case ID/Reference #: [id number]

## 2024-03-30 NOTE — Telephone Encounter (Addendum)
 DENIAL LETTER IN MEDIA

## 2024-04-05 LAB — HM MAMMOGRAPHY

## 2024-04-09 ENCOUNTER — Other Ambulatory Visit: Payer: Self-pay

## 2024-04-21 ENCOUNTER — Other Ambulatory Visit (HOSPITAL_BASED_OUTPATIENT_CLINIC_OR_DEPARTMENT_OTHER): Payer: Self-pay

## 2024-05-03 ENCOUNTER — Other Ambulatory Visit (HOSPITAL_BASED_OUTPATIENT_CLINIC_OR_DEPARTMENT_OTHER): Payer: Self-pay

## 2024-05-05 ENCOUNTER — Ambulatory Visit (INDEPENDENT_AMBULATORY_CARE_PROVIDER_SITE_OTHER)

## 2024-05-05 ENCOUNTER — Encounter: Payer: Self-pay | Admitting: Podiatry

## 2024-05-05 ENCOUNTER — Ambulatory Visit (INDEPENDENT_AMBULATORY_CARE_PROVIDER_SITE_OTHER): Admitting: Podiatry

## 2024-05-05 VITALS — Ht 64.0 in | Wt 241.0 lb

## 2024-05-05 DIAGNOSIS — M67471 Ganglion, right ankle and foot: Secondary | ICD-10-CM

## 2024-05-05 DIAGNOSIS — M6740 Ganglion, unspecified site: Secondary | ICD-10-CM | POA: Diagnosis not present

## 2024-05-05 MED ORDER — TRIAMCINOLONE ACETONIDE 10 MG/ML IJ SUSP
10.0000 mg | Freq: Once | INTRAMUSCULAR | Status: AC
Start: 2024-05-05 — End: 2024-05-05
  Administered 2024-05-05: 10 mg via INTRA_ARTICULAR

## 2024-05-06 NOTE — Progress Notes (Signed)
 Subjective:   Patient ID: Ann Watson, female   DOB: 53 y.o.   MRN: [id number]   HPI Patient presents stating over the last couple weeks has developed pain in the right foot where ganglionic was removed last year and states she does not remember injury   ROS      Objective:  Physical Exam  Neurovascular status intact with inflammation of the lateral foot no indications of reoccurrence of ganglion within the incision site there may be slight ganglionic within the more distal lateral portion or bone structural issues     Assessment:  Possible slight reoccurrence ganglionic or new ganglionic formation right along with possible bony injury     Plan:  H&P reviewed today I went ahead I did sterile prep I injected the tissues 3 mg dexamethasone  Kenalog 5 mg Xylocaine  and attempted to drain not currently able to apply compression will be seen back as needed hopefully this will solve any remaining problem  X-rays were negative for signs that there is any bony pathology appears to be strictly soft tissue currently

## 2024-07-21 ENCOUNTER — Telehealth: Payer: Self-pay | Admitting: Family Medicine

## 2024-07-21 ENCOUNTER — Other Ambulatory Visit (HOSPITAL_BASED_OUTPATIENT_CLINIC_OR_DEPARTMENT_OTHER): Payer: Self-pay

## 2024-07-21 ENCOUNTER — Encounter: Payer: Self-pay | Admitting: Family Medicine

## 2024-07-21 ENCOUNTER — Ambulatory Visit: Admitting: Family Medicine

## 2024-07-21 VITALS — BP 125/81 | HR 62 | Temp 98.0°F | Ht 64.0 in | Wt 246.8 lb

## 2024-07-21 DIAGNOSIS — F4329 Adjustment disorder with other symptoms: Secondary | ICD-10-CM | POA: Diagnosis not present

## 2024-07-21 DIAGNOSIS — Z0001 Encounter for general adult medical examination with abnormal findings: Secondary | ICD-10-CM

## 2024-07-21 DIAGNOSIS — Z79890 Hormone replacement therapy (postmenopausal): Secondary | ICD-10-CM

## 2024-07-21 DIAGNOSIS — R079 Chest pain, unspecified: Secondary | ICD-10-CM | POA: Diagnosis not present

## 2024-07-21 MED ORDER — WEGOVY 0.25 MG/0.5ML ~~LOC~~ SOAJ
0.2500 mg | SUBCUTANEOUS | 0 refills | Status: DC
  Filled 2024-07-21: qty 2, 28d supply, fill #0

## 2024-07-21 NOTE — Progress Notes (Signed)
 Subjective  Chief Complaint  Patient presents with   Annual Exam    Pt here for Annual exam and is not currently fasting    Obesity    HPI: Ann Watson is a 53 y.o. female who presents to [facility] [place] today for a Female Wellness Visit. She also has the concerns and/or needs as listed above in the chief complaint. These will be addressed in addition to the Health Maintenance Visit.   Wellness Visit: annual visit with health maintenance review and exam  HM: h/o hysterectomy. Crc and mammo screens are current. Works all the time. Several jobs. Little exercise. Imms up to date Chronic disease f/u and/or acute problem visit: (deemed necessary to be done in addition to the wellness visit): 53 year old who complains about intermittent heaviness or tightness in the chest.  She reports a specific episode where she was under a large amount of work stress, reports it as a very tumultuous day, working under the wire and felt some tightness in her chest.  She had no associated nausea lightheadedness diaphoresis or palpitations.  When she gets very stressed, sometimes she will feel chest tightness.  She worries about her heart.  She does have a strong family history of heart disease.  She denies exertional symptoms.  She does some strenuous heavy lifting and never has any problems with chest pain or shortness of breath during those episodes.  She was doing some walking for exercise and had no chest pain with that either.  No lower extremity edema.  Risk factors for heart disease or family history and obesity. She stopped all of her medicines.  She has concerned about gabapentin  and risk for dementia.  She did not feel progesterone was helpful for sleep.  She does feel that hormone replacement therapy would be beneficial for her overall.  She has intermittent sleep sleep problems, stress problems and feels hormones will be helpful.  She inquired about bioidentical hormone placement  therapy.  She does not have a uterus, removed for heavy bleeding due to fibroids.  She does have her ovaries. She admits she is stressed most of the time.  She does not feel that she needs medications.  She does do meditation.  She is worried about her overall health but does not have time to really take care of herself. Obesity: She had lost 20 pounds on Wegovy  but was lost to follow-up and was unable to titrate medication dose up, therefore she no longer met plan requirements.  It was stopped back in April.  She has regained about 10 to 20 pounds loss.  She would like to restart.  She had no adverse side effects  Assessment  1. Encounter for well adult exam with abnormal findings   2. Morbid obesity (HCC)   3. Postmenopausal hormone therapy   4. Chest pain, unspecified type   5. Stress and adjustment reaction      Plan  Female Wellness Visit: Age appropriate Health Maintenance and Prevention measures were discussed with patient. Included topics are cancer screening recommendations, ways to keep healthy (see AVS) including dietary and exercise recommendations, regular eye and dental care, use of seat belts, and avoidance of moderate alcohol use and tobacco use.  Screenings are current BMI: discussed patient's BMI and encouraged positive lifestyle modifications to help get to or maintain a target BMI. HM needs and immunizations were addressed and ordered. See below for orders. See HM and immunization section for updates. Routine labs and screening  tests ordered including cmp, cbc and lipids where appropriate. Discussed recommendations regarding Vit D and calcium supplementation (see AVS)  Chronic disease management visit and/or acute problem visit: Atypical chest pain, possibly anxiety related but she does have some risk factors.  EKG today shows first-degree AV block and occasional PAC.  No ischemic changes are present.  Recommend stress management see next Stress anxiety: Had long discussion  regarding behavioral management strategies.  Recommend consideration for therapy.  Recommending working on [employer], [employer]. Obesity: Recommend trying to get into an exercise routine.  Better diet.  Start Wegovy   0.25 mg weekly. Postmenopausal hormone replacement therapy: This could be helpful.  Patient will look into GYN or alternative integrative health specialist. To ER for chest pain Follow up: 8 weeks to recheck weight, anxiety, blood pressure.   Orders Placed This Encounter  Procedures   CBC with Differential/Platelet   Comprehensive metabolic panel with GFR   Lipid panel   TSH   Hemoglobin A1c   EKG 12-Lead   Meds ordered this encounter  Medications   WEGOVY  0.25 MG/0.5ML SOAJ    Sig: Inject 0.25 mg into the skin once a week.    Dispense:  2 mL    Refill:  0    Pt was lost to follow up and did not titrate up dose: need to start over. Please start new PA. Had successful weight loss on same med      Body mass index is 42.36 kg/m. Wt Readings from Last 3 Encounters:  07/21/24 246 lb 12.8 oz (111.9 kg)  05/05/24 241 lb (109.3 kg)  12/29/23 241 lb (109.3 kg)     Patient Active Problem List   Diagnosis Date Noted   Morbid obesity (HCC) 09/13/2019    Priority: High    Trial of Wegovy , July 2024    Cervical radiculopathy 12/21/2015    Priority: Patti Economy, ortho    Hyposmia 12/30/2023    Priority: Medium     Evaluated by ENT: Dr. Tobie 12/2023. Idiopathic, covid or post viral or other. Start nasal sprays and follow.    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 Dr. Ebbie consulted 06/2023    Postmenopausal hormone therapy 07/21/2023    Priority: Medium     Status post complete hysterectomy, started progesterone 2024 through integrative health, [facility]    Facet arthritis, degenerative, lumbar spine 08/21/2022    Priority: Medium      L4-5, L5-S1 by lumbar MRI 07/2022, Dr. Maude Right, [facility]    Radiculopathy, lumbar region 08/15/2022    Priority: Medium    Sensorineural hearing loss (SNHL), bilateral 09/10/2018    Priority: Medium    Nephrolithiasis 03/09/2018    Priority: Medium    Rosacea 03/15/2022    Priority: Low   S/P laparoscopic hysterectomy 06/12/2021    Priority: Low   Internal hemorrhoids with prolapse/pain 08/07/2011    Priority: Low   Elevated blood pressure reading without diagnosis of hypertension 07/21/2023    Strong family history, 2 elevated readings, July 2024.  To start home readings, low-salt diet and recheck    Health Maintenance  Topic Date Due   Hepatitis B Vaccines (1 of 3 - 19+ 3-dose series) Never done   COVID-19 Vaccine (3 - Pfizer risk series) 08/06/2024 (Originally 01/26/2021)   INFLUENZA VACCINE  07/23/2024   MAMMOGRAM  04/05/2025   Fecal  DNA (Cologuard)  04/14/2025   DTaP/Tdap/Td (4 - Td or Tdap) 06/25/2028   Hepatitis C Screening  Completed   HIV Screening  Completed   Zoster Vaccines- Shingrix   Completed   HPV VACCINES  Aged Out   Meningococcal B Vaccine  Aged Out   Immunization History  Administered Date(s) Administered   Influenza Inj Mdck Quad Pf 09/14/2023   Influenza, Seasonal, Injecte, Preservative Fre 10/21/2014, 10/13/2015   Influenza,inj,Quad PF,6+ Mos 09/13/2019, 09/12/2020   Influenza-Unspecified 11/28/2021   Moderna SARS-COV2 Booster Vaccination 12/29/2020   PFIZER(Purple Top)SARS-COV-2 Vaccination 03/03/2020, 03/27/2020   Td 12/23/2010   Tdap 01/23/2016, 06/25/2018   Zoster Recombinant(Shingrix ) 07/21/2023, 09/22/2023   We updated and reviewed the patient's past history in detail and it is documented below. Allergies: Patient is allergic to bee venom, kiwi extract, and mango flavoring agent (non-screening). Past Medical History Patient  has a past medical history of Concussion, External hemorrhoid, thrombosed (08/07/2011), Facet arthritis,  degenerative, lumbar spine (08/21/2022), Fibroids (06/04/2021), Hemorrhoids, History of kidney stones, HSV infection, and Wears glasses. Past Surgical History Patient  has a past surgical history that includes Kidney stone surgery; ORIF ankle fracture (Right, 01/15/2014); Multiple tooth extractions; Dilatation & currettage/hysteroscopy with hydrothermal ablation (N/A, 08/19/2018); Robotic assisted laparoscopic hysterectomy and salpingectomy (N/A, 06/12/2021); Breast biopsy (Right, 05/09/2023); and Abdominal hysterectomy (2022). Family History: Patient family history includes Arthritis in her maternal grandmother and paternal grandfather; Birth defects in her paternal grandfather; Cancer in her paternal grandfather; Depression in her maternal grandmother; Diabetes in her maternal grandfather and mother; Drug abuse in her father; Early death in her father and paternal grandfather; Heart disease in her maternal grandfather, mother, and paternal grandfather; Hyperlipidemia in her maternal grandmother; Hypertension in her maternal grandmother and mother; Thyroid  cancer (age of onset: 85) in her maternal aunt. Social History:  Patient  reports that she quit smoking about 16 years ago. Her smoking use included cigarettes. She started smoking about 39 years ago. She has a 34.5 pack-year smoking history. She has never used smokeless tobacco. She reports current alcohol use of about 1.0 - 2.0 standard drink of alcohol per week. She reports that she does not currently use drugs after having used the following drugs: Marijuana.  Review of Systems: Constitutional: negative for fever or malaise Ophthalmic: negative for photophobia, double vision or loss of vision Cardiovascular: negative for chest pain, dyspnea on exertion, or new LE swelling Respiratory: negative for SOB or persistent cough Gastrointestinal: negative for abdominal pain, change in bowel habits or melena Genitourinary: negative for dysuria or gross  hematuria, no abnormal uterine bleeding or disharge Musculoskeletal: negative for new gait disturbance or muscular weakness Integumentary: negative for new or persistent rashes, no breast lumps Neurological: negative for TIA or stroke symptoms Psychiatric: negative for SI or delusions Allergic/Immunologic: negative for hives  Patient Care Team    Relationship Specialty Notifications Start End  Jodie Lavern CROME, MD PCP - General Family Medicine  09/13/19   Addie, Cordella Hamilton, MD Consulting Physician Orthopedic Surgery  09/13/19   Ob/Gyn, Landy Stains Consulting Physician Gynecology  09/13/19   Rosalva Sawyer, MD Consulting Physician Obstetrics and Gynecology  01/01/22   Ebbie Cough, MD Consulting Physician General Surgery  07/07/23   Haverstock, Tawni CROME, MD Consulting Physician Dermatology  07/21/23   Anderson Coy Consulting Physician Orthopedic Surgery  07/21/23    Comment: Maralee Tobie Eldora KATHEE, MD Consulting Physician Otolaryngology  12/30/23     Objective  Vitals: BP 125/81   Pulse 62   Temp  98 F (36.7 C)   Ht 5' 4 (1.626 m)   Wt 246 lb 12.8 oz (111.9 kg)   LMP 05/23/2021   SpO2 96%   BMI 42.36 kg/m  General:  Well developed, well nourished, no acute distress  Psych:  Alert and orientedx3,normal mood and affect HEENT:  Normocephalic, atraumatic, non-icteric sclera,  supple neck without adenopathy, mass or thyromegaly Cardiovascular:  Normal S1, S2, RRR without gallop, rub or murmur Respiratory:  Good breath sounds bilaterally, CTAB with normal respiratory effort Gastrointestinal: normal bowel sounds, soft, non-tender, no noted masses. No HSM MSK: extremities without edema, joints without erythema or swelling Neurologic:    Mental status is normal.  Gross motor and sensory exams are normal.  No tremor  Commons side effects, risks, benefits, and alternatives for medications and treatment plan prescribed today were discussed, and the patient expressed understanding of  the given instructions. Patient is instructed to call or message via MyChart if he/she has any questions or concerns regarding our treatment plan. No barriers to understanding were identified. We discussed Red Flag symptoms and signs in detail. Patient expressed understanding regarding what to do in case of urgent or emergency type symptoms.  Medication list was reconciled, printed and provided to the patient in AVS. Patient instructions and summary information was reviewed with the patient as documented in the AVS. This note was prepared with assistance of Dragon voice recognition software. Occasional wrong-word or sound-a-like substitutions may have occurred due to the inherent limitations of voice recognition software

## 2024-07-21 NOTE — Telephone Encounter (Signed)
 Pt had lab work done today (07/21/24) but it hemolyzed. Please place new orders. Pt will return on 07/23/24 for a redraw

## 2024-07-21 NOTE — Patient Instructions (Addendum)
 Please return in 8 weeks to recheck blood pressure, weight and stress.  I will release your lab results to you on your MyChart account with further instructions. You may see the results before I do, but when I review them I will send you a message with my report or have my assistant call you if things need to be discussed. Please reply to my message with any questions. Thank you!   If you have any questions or concerns, please don't hesitate to send me a message via MyChart or call the office at [phone]. Thank you for visiting with us  today! It's our pleasure caring for you.   Stress, Adult Stress is a normal reaction to life events. Stress is what you feel when life demands more than you are used to, or more than you think you can handle. Some stress can be useful, such as studying for a test or meeting a deadline at work. Stress that occurs too often or for too long can cause problems. Long-lasting stress is called chronic stress. Chronic stress can affect your emotional health and interfere with relationships and normal daily activities. Too much stress can weaken your body's defense system (immune system) and increase your risk for physical illness. If you already have a medical problem, stress can make it worse. What are the causes? All sorts of life events can cause stress. An event that causes stress for one person may not be stressful for someone else. Major life events, whether positive or negative, commonly cause stress. Examples include: Losing a job or starting a new job. Losing a loved one. Moving to a new town or home. Getting married or divorced. Having a baby. Getting injured or sick. Less obvious life events can also cause stress, especially if they occur day after day or in combination with each other. Examples include: Working long hours. Driving in traffic. Caring for children. Being in debt. Being in a difficult relationship. What are the signs or symptoms? Stress  can cause emotional and physical symptoms and can lead to unhealthy behaviors. These include the following: Emotional symptoms Anxiety. This is feeling worried, afraid, on edge, overwhelmed, or out of control. Anger, including irritation or impatience. Depression. This is feeling sad, down, helpless, or guilty. Trouble focusing, remembering, or making decisions. Physical symptoms Aches and pains. These may affect your head, neck, back, stomach, or other areas of your body. Tight muscles or a clenched jaw. Low energy. Trouble sleeping. Unhealthy behaviors Eating to feel better (overeating) or skipping meals. Working too much or putting off tasks. Smoking, drinking alcohol, or using drugs to feel better. How is this diagnosed? A stress disorder is diagnosed through an assessment by your health care provider. A stress disorder may be diagnosed based on: Your symptoms and any stressful life events. Your medical history. Tests to rule out other causes of your symptoms. Depending on your condition, your health care provider may refer you to a specialist for further evaluation. How is this treated?  Stress management techniques are the recommended treatment for stress. Medicine is not typically recommended for treating stress. Techniques to reduce your reaction to stressful life events include: Identifying stress. Monitor yourself for symptoms of stress and notice what causes stress for you. These skills may help you to avoid or prepare for stressful events. Managing time. Set your priorities, keep a calendar of events, and learn to say no. These actions can help you avoid taking on too much. Techniques for dealing with stress include: Rethinking the  problem. Try to think realistically about stressful events rather than ignoring them or overreacting. Try to find the positives in a stressful situation rather than focusing on the negatives. Exercise. Physical exercise can release both physical  and emotional tension. The key is to find a form of exercise that you enjoy and do it regularly. Relaxation techniques. These relax the body and mind. Find one or more that you enjoy and use the techniques regularly. Examples include: Meditation, deep breathing, or progressive relaxation techniques. Yoga or tai chi. Biofeedback, mindfulness techniques, or journaling. Listening to music, being in nature, or taking part in other hobbies. Practicing a healthy lifestyle. Eat a balanced diet, drink plenty of water, limit or avoid caffeine, and get plenty of sleep. Having a strong support network. Spend time with family, friends, or other people you enjoy being around. Express your feelings and talk things over with someone you trust. Counseling or talk therapy with a mental health provider may help if you are having trouble managing stress by yourself. Follow these instructions at home: Lifestyle  Avoid drugs. Do not use any products that contain nicotine or tobacco. These products include cigarettes, chewing tobacco, and vaping devices, such as e-cigarettes. If you need help quitting, ask your health care provider. If you drink alcohol: Limit how much you have to: 0-1 drink a day for women who are not pregnant. 0-2 drinks a day for men. Know how much alcohol is in a drink. In the [place] one drink equals one 12 oz bottle of beer (355 mL), one 5 oz glass of wine (148 mL), or one 1 oz glass of hard liquor (44 mL). Do not use alcohol or drugs to relax. Eat a balanced diet that includes fresh fruits and vegetables, whole grains, lean meats, fish, eggs, beans, and low-fat dairy. Avoid processed foods and foods high in added fat, sugar, and salt. Exercise at least 30 minutes on 5 or more days each week. Get 7-8 hours of sleep each night. General instructions  Practice stress management techniques as told by your health care provider. Drink enough fluid to keep your urine pale yellow. Take  over-the-counter and prescription medicines only as told by your health care provider. Keep all follow-up visits. This is important. Contact a health care provider if: Your symptoms get worse. You have new symptoms. You feel overwhelmed by your problems and can no longer manage them by yourself. Get help right away if: You have thoughts of hurting yourself or others. Get help right awayif you feel like you may hurt yourself or others, or have thoughts about taking your own life. Go to your nearest emergency room or: Call 911. Call the National Suicide Prevention Lifeline at 712-412-6520 or 988. This is open 24 hours a day. Text the Crisis Text Line at 8630286325. Summary Stress is a normal reaction to life events. It can cause problems if it happens too often or for too long. Practicing stress management techniques is the best way to treat stress. Counseling or talk therapy with a mental health provider may help if you are having trouble managing stress by yourself. This information is not intended to replace advice given to you by your health care provider. Make sure you discuss any questions you have with your health care provider. Document Revised: 07/19/2021 Document Reviewed: 07/19/2021 Elsevier Patient Education  2024 [employer].

## 2024-07-22 ENCOUNTER — Other Ambulatory Visit (HOSPITAL_COMMUNITY): Payer: Self-pay

## 2024-07-22 ENCOUNTER — Telehealth: Payer: Self-pay

## 2024-07-22 NOTE — Telephone Encounter (Signed)
 Pharmacy Patient Advocate Encounter   Received notification from Patient Pharmacy that prior authorization for Wegovy  0.4m/0.5ml is required/requested.   Insurance verification completed.   The patient is insured through [facility] .   Per test claim: PA required; PA submitted to above mentioned insurance via Prompt PA Key/confirmation #/[id number] Status is pending

## 2024-07-22 NOTE — Telephone Encounter (Signed)
 Orders has been replaced as Future

## 2024-07-22 NOTE — Addendum Note (Signed)
 Addended by: NYLE MACINTOSH A on: 07/22/2024 08:48 AM   Modules accepted: Orders

## 2024-07-23 ENCOUNTER — Other Ambulatory Visit

## 2024-07-23 ENCOUNTER — Other Ambulatory Visit (HOSPITAL_BASED_OUTPATIENT_CLINIC_OR_DEPARTMENT_OTHER): Payer: Self-pay

## 2024-07-23 DIAGNOSIS — Z0001 Encounter for general adult medical examination with abnormal findings: Secondary | ICD-10-CM

## 2024-07-23 LAB — COMPREHENSIVE METABOLIC PANEL WITH GFR
ALT: 18 U/L (ref 0–35)
AST: 14 U/L (ref 0–37)
Albumin: 4 g/dL (ref 3.5–5.2)
Alkaline Phosphatase: 89 U/L (ref 39–117)
BUN: 14 mg/dL (ref 6–23)
CO2: 28 meq/L (ref 19–32)
Calcium: 9.2 mg/dL (ref 8.4–10.5)
Chloride: 104 meq/L (ref 96–112)
Creatinine, Ser: 0.75 mg/dL (ref 0.40–1.20)
GFR: 89.76 mL/min (ref >60.00)
Glucose, Bld: 96 mg/dL (ref 70–99)
Potassium: 4.2 meq/L (ref 3.5–5.1)
Sodium: 139 meq/L (ref 135–145)
Total Bilirubin: 0.6 mg/dL (ref 0.2–1.2)
Total Protein: 7.1 g/dL (ref 6.0–8.3)

## 2024-07-23 LAB — LIPID PANEL
Cholesterol: 176 mg/dL (ref 0–200)
HDL: 48.9 mg/dL (ref >39.00)
LDL Cholesterol: 111 mg/dL — ABNORMAL HIGH (ref 0–99)
NonHDL: 127.07
Total CHOL/HDL Ratio: 4
Triglycerides: 82 mg/dL (ref 0.0–149.0)
VLDL: 16.4 mg/dL (ref 0.0–40.0)

## 2024-07-23 LAB — CBC WITH DIFFERENTIAL/PLATELET
Basophils Absolute: 0.1 K/uL (ref 0.0–0.1)
Basophils Relative: 1 % (ref 0.0–3.0)
Eosinophils Absolute: 0.2 K/uL (ref 0.0–0.7)
Eosinophils Relative: 2.9 % (ref 0.0–5.0)
HCT: 41.8 % (ref 36.0–46.0)
Hemoglobin: 13.9 g/dL (ref 12.0–15.0)
Lymphocytes Relative: 21.2 % (ref 12.0–46.0)
Lymphs Abs: 1.8 K/uL (ref 0.7–4.0)
MCHC: 33.2 g/dL (ref 30.0–36.0)
MCV: 85.8 fl (ref 78.0–100.0)
Monocytes Absolute: 0.6 K/uL (ref 0.1–1.0)
Monocytes Relative: 7.1 % (ref 3.0–12.0)
Neutro Abs: 5.7 K/uL (ref 1.4–7.7)
Neutrophils Relative %: 67.8 % (ref 43.0–77.0)
Platelets: 225 K/uL (ref 150.0–400.0)
RBC: 4.88 Mil/uL (ref 3.87–5.11)
RDW: 13.6 % (ref 11.5–15.5)
WBC: 8.4 K/uL (ref 4.0–10.5)

## 2024-07-23 LAB — HEMOGLOBIN A1C: Hgb A1c MFr Bld: 5.7 % (ref 4.6–6.5)

## 2024-07-23 LAB — TSH: TSH: 3.31 u[IU]/mL (ref 0.35–5.50)

## 2024-07-26 ENCOUNTER — Other Ambulatory Visit (HOSPITAL_COMMUNITY): Payer: Self-pay

## 2024-07-26 ENCOUNTER — Other Ambulatory Visit (HOSPITAL_BASED_OUTPATIENT_CLINIC_OR_DEPARTMENT_OTHER): Payer: Self-pay

## 2024-07-26 NOTE — Telephone Encounter (Signed)
 Pharmacy Patient Advocate Encounter  Received notification from [employer] that Prior Authorization for Wegovy  0.25mg /0.74ml has been APPROVED from 07/26/24 to 01/23/25. Ran test claim, Copay is $1,297.93. This test claim was processed through [facility]- copay amounts may vary at other pharmacies due to pharmacy/plan contracts, or as the patient moves through the different stages of their insurance plan.   PA #/Case ID/Reference #: [id number]  Patient may go to the Wegovy  website at [email or web address] .com/coverage-and-savings/ for a coupon to help with copay cost.

## 2024-07-27 ENCOUNTER — Encounter: Admitting: Family Medicine

## 2024-07-30 ENCOUNTER — Encounter: Payer: Self-pay | Admitting: Family Medicine

## 2024-08-02 ENCOUNTER — Other Ambulatory Visit (HOSPITAL_BASED_OUTPATIENT_CLINIC_OR_DEPARTMENT_OTHER): Payer: Self-pay

## 2024-08-02 MED ORDER — PROGESTERONE MICRONIZED 100 MG PO CAPS
100.0000 mg | ORAL_CAPSULE | Freq: Every day | ORAL | 0 refills | Status: DC
  Filled 2024-08-02: qty 60, 30d supply, fill #0

## 2024-08-02 NOTE — Telephone Encounter (Signed)
 Please forward EKG to pt so she can get to [facility] or send her the printed copy.

## 2024-08-04 ENCOUNTER — Ambulatory Visit: Payer: Self-pay | Admitting: Family Medicine

## 2024-08-04 NOTE — Progress Notes (Signed)
 See mychart note The 10-year ASCVD risk score (Arnett DK, et al., 2019) is: 1.9%   Values used to calculate the score:     Age: 53 years     Clincally relevant sex: Female     Is Non-Hispanic African American: No     Diabetic: No     Tobacco smoker: No     Systolic Blood Pressure: 125 mmHg     Is BP treated: No     HDL Cholesterol: 48.9 mg/dL     Total Cholesterol: 176 mg/dL

## 2024-08-05 ENCOUNTER — Other Ambulatory Visit (HOSPITAL_BASED_OUTPATIENT_CLINIC_OR_DEPARTMENT_OTHER): Payer: Self-pay

## 2024-08-11 ENCOUNTER — Other Ambulatory Visit (HOSPITAL_BASED_OUTPATIENT_CLINIC_OR_DEPARTMENT_OTHER): Payer: Self-pay

## 2024-08-11 MED ORDER — BD LUER-LOK SYRINGE 25G X 1" 3 ML MISC
4 refills | Status: AC
  Filled 2024-08-11: qty 4, 28d supply, fill #0
  Filled 2024-09-03: qty 4, 28d supply, fill #1

## 2024-08-11 MED ORDER — CYANOCOBALAMIN 1000 MCG/ML IJ SOLN
1000.0000 ug | INTRAMUSCULAR | 0 refills | Status: DC
  Filled 2024-08-11: qty 4, 28d supply, fill #0

## 2024-09-03 ENCOUNTER — Other Ambulatory Visit (HOSPITAL_BASED_OUTPATIENT_CLINIC_OR_DEPARTMENT_OTHER): Payer: Self-pay

## 2024-09-03 ENCOUNTER — Other Ambulatory Visit: Payer: Self-pay

## 2024-09-05 ENCOUNTER — Other Ambulatory Visit (HOSPITAL_BASED_OUTPATIENT_CLINIC_OR_DEPARTMENT_OTHER): Payer: Self-pay

## 2024-09-05 MED ORDER — CYANOCOBALAMIN 1000 MCG/ML IJ SOLN
1000.0000 ug | INTRAMUSCULAR | 0 refills | Status: DC
  Filled 2024-09-05: qty 4, 28d supply, fill #0

## 2024-09-06 ENCOUNTER — Other Ambulatory Visit (HOSPITAL_BASED_OUTPATIENT_CLINIC_OR_DEPARTMENT_OTHER): Payer: Self-pay

## 2024-09-22 ENCOUNTER — Ambulatory Visit (INDEPENDENT_AMBULATORY_CARE_PROVIDER_SITE_OTHER): Admitting: Family Medicine

## 2024-09-22 ENCOUNTER — Encounter: Payer: Self-pay | Admitting: Family Medicine

## 2024-09-22 DIAGNOSIS — F4329 Adjustment disorder with other symptoms: Secondary | ICD-10-CM

## 2024-09-22 DIAGNOSIS — Z79890 Hormone replacement therapy (postmenopausal): Secondary | ICD-10-CM

## 2024-09-23 ENCOUNTER — Encounter: Payer: Self-pay | Admitting: Family Medicine

## 2024-09-27 NOTE — Progress Notes (Signed)
 Subjective  CC:  Chief Complaint  Patient presents with   Obesity   Weight Check   Hypertension    HPI: Ann Watson is a 53 y.o. female who presents to the office today to address the problems listed above in the chief complaint. Discussed the use of AI scribe software for clinical note transcription with the patient, who gave verbal consent to proceed.  History of Present Illness Ann Watson is a 53 year old female who presents for follow-up on hormone replacement therapy and weight management.  Hormone replacement therapy effects - Currently receiving testosterone and estrogen pellets for hormone replacement therapy - Overall improvement in well-being since initiation of therapy - Experiencing unwanted hair growth on the side of her face as a side effect  Weight management - Previously used Wegovy  for weight management but discontinued due to prescription access issues and high cost - No change in eating habits while on Wegovy , continued to consume cake and ice cream - Did not find Wegovy  effective in promoting healthier eating habits - Since discontinuing Wegovy , began tracking meals and lost approximately 7 pounds (from 250 to 243 pounds) - Acknowledges insufficient exercise  Musculoskeletal symptoms - Chronic knee pain, particularly at night when rolling over, with improvement since starting hormone therapy - Able to walk upstairs and for two miles without significant discomfort - History of knee x-ray without knowledge of arthritis diagnosis - 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 Morbid obesity (HCC)   2. Postmenopausal hormone therapy   3. Stress and adjustment reaction      Plan  Assessment and Plan Assessment & Plan Obesity with elevated hemoglobin A1c Hemoglobin A1c at 5.7, slightly elevated. Discontinued Wegovy  due to cost. Weight loss of 7 pounds achieved  through dietary changes and meal tracking. Discussed GLP-1 receptor agonists' benefits in metabolic regulation and weight management. - Continue dietary changes and meal tracking.  Knee pain likely due to osteoarthritis Knee pain improved with hormone replacement therapy, suggesting reduced inflammation. Osteoarthritis suspected despite normal x-rays.  Hormone replacement therapy management On testosterone and estrogen pellets. Improved mood and knee pain noted, indicating decreased inflammation. Uncertainty about hormone regimen. - Verify hormone replacement therapy regimen and message details to provider.    Follow up: prn No orders of the defined types were placed in this encounter.  No orders of the defined types were placed in this encounter.    I reviewed the patients updated PMH, FH, and SocHx.  Patient Active Problem List   Diagnosis Date Noted   Morbid obesity (HCC) 09/13/2019    Priority: High   Cervical radiculopathy 12/21/2015    Priority: High   Hyposmia 12/30/2023    Priority: Medium    Papilloma of right breast 07/21/2023    Priority: Medium    Postmenopausal hormone therapy 07/21/2023    Priority: Medium    Facet arthritis, degenerative, lumbar spine 08/21/2022    Priority: Medium    Radiculopathy, lumbar region 08/15/2022    Priority: Medium    Sensorineural hearing loss (SNHL), bilateral 09/10/2018    Priority: Medium    Nephrolithiasis 03/09/2018    Priority: Medium    Rosacea 03/15/2022    Priority: Low   S/P laparoscopic hysterectomy 06/12/2021    Priority: Low   Internal hemorrhoids with prolapse/pain 08/07/2011    Priority: Low   Elevated blood pressure reading without diagnosis of hypertension 07/21/2023  No outpatient medications have been marked as taking for the 09/22/24 encounter (Office Visit) with Jodie Lavern CROME, MD.   Allergies: Patient is allergic to bee venom, kiwi extract, and mango flavoring agent (non-screening). Family  History: Patient family history includes Arthritis in her maternal grandmother and paternal grandfather; Birth defects in her paternal grandfather; Cancer in her paternal grandfather; Depression in her maternal grandmother; Diabetes in her maternal grandfather and mother; Drug abuse in her father; Early death in her father and paternal grandfather; Heart disease in her maternal grandfather, mother, and paternal grandfather; Hyperlipidemia in her maternal grandmother; Hypertension in her maternal grandmother and mother; Thyroid  cancer (age of onset: 15) in her maternal aunt. Social History:  Patient  reports that she quit smoking about 17 years ago. Her smoking use included cigarettes. She started smoking about 40 years ago. She has a 34.5 pack-year smoking history. She has never used smokeless tobacco. She reports current alcohol use of about 1.0 - 2.0 standard drink of alcohol per week. She reports that she does not currently use drugs after having used the following drugs: Marijuana.  Review of Systems: Constitutional: Negative for fever malaise or anorexia Cardiovascular: negative for chest pain Respiratory: negative for SOB or persistent cough Gastrointestinal: negative for abdominal pain  Objective  Vitals: BP 124/78   Pulse 69   Temp 98.3 F (36.8 C)   Ht 5' 4 (1.626 m)   Wt 243 lb 6.4 oz (110.4 kg)   LMP 05/23/2021   SpO2 95%   BMI 41.78 kg/m  General: no acute distress , A&Ox3 HEENT: PEERL, conjunctiva normal, neck is supple Cardiovascular:  RRR without murmur or gallop.  Respiratory:  Good breath sounds bilaterally, CTAB with normal respiratory effort Skin:  Warm, no rashes Commons side effects, risks, benefits, and alternatives for medications and treatment plan prescribed today were discussed, and the patient expressed understanding of the given instructions. Patient is instructed to call or message via MyChart if he/she has any questions or concerns regarding our treatment  plan. No barriers to understanding were identified. We discussed Red Flag symptoms and signs in detail. Patient expressed understanding regarding what to do in case of urgent or emergency type symptoms.  Medication list was reconciled, printed and provided to the patient in AVS. Patient instructions and summary information was reviewed with the patient as documented in the AVS. This note was prepared with assistance of Dragon voice recognition software. Occasional wrong-word or sound-a-like substitutions may have occurred due to the inherent limitations of voice recognition software

## 2024-11-01 ENCOUNTER — Ambulatory Visit: Admitting: Podiatry

## 2024-11-03 ENCOUNTER — Encounter: Payer: Self-pay | Admitting: Podiatry

## 2024-11-03 ENCOUNTER — Ambulatory Visit (INDEPENDENT_AMBULATORY_CARE_PROVIDER_SITE_OTHER): Admitting: Podiatry

## 2024-11-03 DIAGNOSIS — M6740 Ganglion, unspecified site: Secondary | ICD-10-CM | POA: Diagnosis not present

## 2024-11-03 NOTE — Progress Notes (Signed)
 Subjective:  Patient ID: Ann Watson, female    DOB: 06-17-1971,   MRN: [id number]  Chief Complaint  Patient presents with   Ganglion Cyst    I have a Ganglion Cyst that is reoccurring.  I say Dr. Magdalen three times and he aspirated it three times.  He did surgery once to remove it.  There's a scar there now.  I think there may be two there now.    53 y.o. female presents for concern of right foot ganglion cyst that has continued to recur.  She relates she has been in the care of Dr. Magdalen in the past and has had the cyst aspirated 3 times.  She had surgery last year to have it removed and now on the cyst has returned.  She relates it is continued to be painful and difficult in certain shoes.  She is only able to wear tennis shoes at this time.  She is hoping for more permanent solution.. Denies any other pedal complaints. Denies n/v/f/c.   Past Medical History:  Diagnosis Date   Concussion    age 67, no residual   External hemorrhoid, thrombosed 08/07/2011   Facet arthritis, degenerative, lumbar spine 08/21/2022   L4-5, L5-S1 by lumbar MRI 07/2022   Fibroids 06/04/2021   Hemorrhoids    History of kidney stones    surgery to remove 5-6 yrs ago as of 06-04-2021 per pt   HSV infection    Wears glasses     Objective:  Physical Exam: Vascular: DP/PT pulses 2/4 bilateral. CFT <3 seconds. Normal hair growth on digits. No edema.  Skin. No lacerations or abrasions bilateral feet.  Lateral dorsal right foot with soft tissue mass noted.  Noted to be 2 possibly 3 next to each other 1 about 1 cm in diameter and the other 2 about 0.5 cm in diameter.  Tender to palpation.  The masses are fluctuant nonmobile.  Cicatrix overlying the area. Musculoskeletal: MMT 5/5 bilateral lower extremities in DF, PF, Inversion and Eversion. Deceased ROM in DF of ankle joint.  Neurological: Sensation intact to light touch.   Assessment:   1. Ganglion      Plan:  Patient was evaluated and treated and all  questions answered. X-rays reviewed and discussed with patient.  No acute fractures or dislocations noted.  No arthritic changes noted.  Mild edema noted to the lateral fifth metatarsal area and area of cyst. Discussed ganglion cysts and treatment options with the patient. Discussed with patient options moving forward to prevent cyst in the future.  Discussed likely aspiration of the cyst would result in reoccurrence subsequently.  Discussed best option is to attempt surgical excision again in the hopes we can remove all of the ganglion cyst and stalk and prevent reoccurrence.  Discussed perioperative and operative course in detail.  Patient is amenable and would like to proceed with surgical excision. -Informed surgical risk consent was reviewed and read aloud to the patient.  I reviewed the films.  I have discussed my findings with the patient in great detail.  I have discussed all risks including but not limited to infection, stiffness, scarring, limp, disability, deformity, damage to blood vessels and nerves, numbness, poor healing, need for braces, arthritis, chronic pain, amputation, death.  All benefits and realistic expectations discussed in great detail.  I have made no promises as to the outcome.  I have provided realistic expectations.  I have offered the patient a 2nd opinion, which they have declined and assured me they  preferred to proceed despite the risks. Plan for surgery 11/18. Postop meds: Zofran , oxycodone  5-325 mg.  Patient to follow-up in 2 weeks or sooner if symptoms worsen or fail to improve.    Asberry Failing, DPM

## 2024-11-10 ENCOUNTER — Telehealth: Payer: Self-pay | Admitting: Podiatry

## 2024-11-10 NOTE — Telephone Encounter (Signed)
 Called and left message for patient to contact office to schedule surgery.

## 2024-11-11 ENCOUNTER — Encounter: Payer: Self-pay | Admitting: Family Medicine

## 2024-11-12 NOTE — Telephone Encounter (Signed)
 Discussed options with pt.  Elects Dr.Well platform.

## 2024-11-15 ENCOUNTER — Telehealth: Payer: Self-pay | Admitting: Podiatry

## 2024-11-15 NOTE — Telephone Encounter (Signed)
 DOS- 11/16/2024  EXC GANGLION/TUMOR RT- 71909  [facility] EFFECTIVE DATE- 02/21/2020  DEDUCTIBLE- $1750 REMAINING- $69.13 OOP- $4000 REMAINING- $2319.13 COINSURANCE- 20%  PER [facility] PORTAL, NO PRIOR AUTH IS REQUIRED FOR CPT CODE 71909. DECISION ID# [id number]

## 2024-11-15 NOTE — Telephone Encounter (Signed)
 Patient scheduled for surgery 11/16/24. Patient not currently on any medications/GLP1/or blood thinners. Patient pharmacy is correct in chart.

## 2024-11-16 ENCOUNTER — Other Ambulatory Visit (HOSPITAL_BASED_OUTPATIENT_CLINIC_OR_DEPARTMENT_OTHER): Payer: Self-pay

## 2024-11-16 ENCOUNTER — Other Ambulatory Visit: Payer: Self-pay | Admitting: Podiatry

## 2024-11-16 DIAGNOSIS — M67471 Ganglion, right ankle and foot: Secondary | ICD-10-CM | POA: Diagnosis not present

## 2024-11-16 MED ORDER — ONDANSETRON HCL 4 MG PO TABS
4.0000 mg | ORAL_TABLET | Freq: Three times a day (TID) | ORAL | 0 refills | Status: AC | PRN
  Filled 2024-11-16: qty 20, 7d supply, fill #0

## 2024-11-16 MED ORDER — OXYCODONE-ACETAMINOPHEN 5-325 MG PO TABS
1.0000 | ORAL_TABLET | ORAL | 0 refills | Status: AC | PRN
  Filled 2024-11-16: qty 20, 4d supply, fill #0

## 2024-11-23 ENCOUNTER — Encounter: Payer: Self-pay | Admitting: Podiatry

## 2024-11-23 ENCOUNTER — Ambulatory Visit (INDEPENDENT_AMBULATORY_CARE_PROVIDER_SITE_OTHER): Admitting: Podiatry

## 2024-11-23 DIAGNOSIS — Z9889 Other specified postprocedural states: Secondary | ICD-10-CM

## 2024-11-23 NOTE — Progress Notes (Signed)
  Subjective:  Patient ID: Ann Watson, female    DOB: [birth year],  MRN: [id number]  Chief Complaint  Patient presents with   Routine Post Op    POV #1 DOS 11/16/24 RT EXC GANGLION/TUMOR Swelling and bruising. Sutures in tact.     DOS: 11/16/24 Procedure: Right ganglion cyst excision  53 y.o. female returns for POV#1. Relate doing well and managing pain but has been sore.   Review of Systems: Negative except as noted in the HPI. Denies N/V/F/Ch.  Past Medical History:  Diagnosis Date   Concussion    age 65, no residual   External hemorrhoid, thrombosed 08/07/2011   Facet arthritis, degenerative, lumbar spine 08/21/2022   L4-5, L5-S1 by lumbar MRI 07/2022   Fibroids 06/04/2021   Hemorrhoids    History of kidney stones    surgery to remove 5-6 yrs ago as of 06-04-2021 per pt   HSV infection    Wears glasses     Current Outpatient Medications:    ondansetron  (ZOFRAN ) 4 MG tablet, Take 1 tablet (4 mg total) by mouth every 8 (eight) hours as needed for nausea or vomiting., Disp: 20 tablet, Rfl: 0   SYRINGE-NEEDLE, DISP, 3 ML (B-D 3CC LUER-LOK SYR 25GX1) 25G X 1 3 ML MISC, Inject as directed intramuscularly once weekly, Disp: 4 each, Rfl: 4  Social History   Tobacco Use  Smoking Status Former   Current packs/day: 0.00   Average packs/day: 1.5 packs/day for 23.0 years (34.5 ttl pk-yrs)   Types: Cigarettes   Start date: 08/09/1984   Quit date: 07/31/2007   Years since quitting: 17.3  Smokeless Tobacco Never    Allergies  Allergen Reactions   Bee Venom Anaphylaxis   Kiwi Extract Anaphylaxis   Mango Flavoring Agent (Non-Screening) Hives    Oil in mango   Tegaderm Ag Mesh [Silver] Rash   Objective:  There were no vitals filed for this visit. There is no height or weight on file to calculate BMI. Constitutional Well developed. Well nourished.  Vascular Foot warm and well perfused. Capillary refill normal to all digits.   Neurologic Normal speech. Oriented to person,  place, and time. Epicritic sensation to light touch grossly present bilaterally.  Dermatologic Skin healing well without signs of infection. Skin edges well coapted without signs of infection.  Orthopedic: Tenderness to palpation noted about the surgical site.   Pathology report: Ganglion cyst  Assessment:   1. Post-operative state    Plan:  Patient was evaluated and treated and all questions answered.  S/p foot surgery right -Progressing as expected post-operatively. -WB Status: WBAT in surgical shoe  -Sutures: intact. -Medications: n/a -Foot redressed.  Return in 2 weeks for suture removal.   No follow-ups on file.

## 2024-12-07 ENCOUNTER — Encounter: Admitting: Podiatry

## 2024-12-08 ENCOUNTER — Encounter: Payer: Self-pay | Admitting: Podiatry

## 2024-12-08 ENCOUNTER — Ambulatory Visit: Admitting: Podiatry

## 2024-12-08 DIAGNOSIS — M6740 Ganglion, unspecified site: Secondary | ICD-10-CM

## 2024-12-08 DIAGNOSIS — Z9889 Other specified postprocedural states: Secondary | ICD-10-CM

## 2024-12-08 NOTE — Progress Notes (Signed)
°  Subjective:  Patient ID: Ann Watson, female    DOB: [birth year],  MRN: [id number]  Chief Complaint  Patient presents with   Routine Post Op    POV #2 DOS 11/16/24 RT EXC GANGLION/TUMOR It's fine, sore but it's to be expected.  The swelling is almost gone but I still can't get a regular shoe on.    DOS: 11/16/24 Procedure: Right ganglion cyst excision  53 y.o. female returns for POV#2. Relate doing well.  Mild swelling and soreness.  Review of Systems: Negative except as noted in the HPI. Denies N/V/F/Ch.  Past Medical History:  Diagnosis Date   Concussion    age 41, no residual   External hemorrhoid, thrombosed 08/07/2011   Facet arthritis, degenerative, lumbar spine 08/21/2022   L4-5, L5-S1 by lumbar MRI 07/2022   Fibroids 06/04/2021   Hemorrhoids    History of kidney stones    surgery to remove 5-6 yrs ago as of 06-04-2021 per pt   HSV infection    Wears glasses     Current Outpatient Medications:    ondansetron  (ZOFRAN ) 4 MG tablet, Take 1 tablet (4 mg total) by mouth every 8 (eight) hours as needed for nausea or vomiting., Disp: 20 tablet, Rfl: 0   SYRINGE-NEEDLE, DISP, 3 ML (B-D 3CC LUER-LOK SYR 25GX1) 25G X 1 3 ML MISC, Inject as directed intramuscularly once weekly, Disp: 4 each, Rfl: 4  Social History   Tobacco Use  Smoking Status Former   Current packs/day: 0.00   Average packs/day: 1.5 packs/day for 23.0 years (34.5 ttl pk-yrs)   Types: Cigarettes   Start date: 08/09/1984   Quit date: 07/31/2007   Years since quitting: 17.3  Smokeless Tobacco Never    Allergies  Allergen Reactions   Bee Venom Anaphylaxis   Kiwi Extract Anaphylaxis   Mango Flavoring Agent (Non-Screening) Hives    Oil in mango   Tegaderm Ag Mesh [Silver] Rash   Objective:  There were no vitals filed for this visit. There is no height or weight on file to calculate BMI. Constitutional Well developed. Well nourished.  Vascular Foot warm and well perfused. Capillary refill normal  to all digits.   Neurologic Normal speech. Oriented to person, place, and time. Epicritic sensation to light touch grossly present bilaterally.  Dermatologic Skin healing well without signs of infection. Skin edges well coapted without signs of infection.  Orthopedic: Tenderness to palpation noted about the surgical site.   Pathology report: Ganglion cyst  Assessment:   1. Post-operative state   2. Ganglion     Plan:  Patient was evaluated and treated and all questions answered.  S/p foot surgery right -Progressing as expected post-operatively. -WB Status: May begin transition to regular shoes weightbearing as tolerated -Sutures: Removed today without incident -Medications: n/a -Foot redressed.  Return in 3 weeks for recheck  Return in about 3 weeks (around 12/29/2024) for post op.

## 2025-01-24 ENCOUNTER — Encounter: Payer: Self-pay | Admitting: Family Medicine
